# Patient Record
Sex: Female | Born: 1960 | Race: Black or African American | Hispanic: No | Marital: Single | Smoking: Former smoker
Health system: Southern US, Community
[De-identification: ages and names within clinical notes are randomized; demographics above are authoritative.]

## PROBLEM LIST (undated history)

## (undated) ENCOUNTER — Emergency Department (HOSPITAL_COMMUNITY): Payer: Self-pay | Source: Home / Self Care

## (undated) DIAGNOSIS — E785 Hyperlipidemia, unspecified: Secondary | ICD-10-CM

## (undated) DIAGNOSIS — I10 Essential (primary) hypertension: Secondary | ICD-10-CM

## (undated) DIAGNOSIS — D571 Sickle-cell disease without crisis: Secondary | ICD-10-CM

## (undated) DIAGNOSIS — D689 Coagulation defect, unspecified: Secondary | ICD-10-CM

## (undated) DIAGNOSIS — K219 Gastro-esophageal reflux disease without esophagitis: Secondary | ICD-10-CM

## (undated) DIAGNOSIS — I82409 Acute embolism and thrombosis of unspecified deep veins of unspecified lower extremity: Secondary | ICD-10-CM

## (undated) DIAGNOSIS — T7840XA Allergy, unspecified, initial encounter: Secondary | ICD-10-CM

## (undated) DIAGNOSIS — D649 Anemia, unspecified: Secondary | ICD-10-CM

## (undated) HISTORY — DX: Sickle-cell disease without crisis: D57.1

## (undated) HISTORY — DX: Coagulation defect, unspecified: D68.9

## (undated) HISTORY — DX: Anemia, unspecified: D64.9

## (undated) HISTORY — DX: Essential (primary) hypertension: I10

## (undated) HISTORY — DX: Gastro-esophageal reflux disease without esophagitis: K21.9

## (undated) HISTORY — DX: Allergy, unspecified, initial encounter: T78.40XA

## (undated) HISTORY — PX: BREAST SURGERY: SHX581

## (undated) HISTORY — PX: TUBAL LIGATION: SHX77

## (undated) HISTORY — PX: COSMETIC SURGERY: SHX468

---

## 2000-05-28 ENCOUNTER — Other Ambulatory Visit: Admission: RE | Admit: 2000-05-28 | Discharge: 2000-05-28 | Payer: Self-pay | Admitting: Obstetrics and Gynecology

## 2002-05-12 ENCOUNTER — Other Ambulatory Visit: Admission: RE | Admit: 2002-05-12 | Discharge: 2002-05-12 | Payer: Self-pay | Admitting: Obstetrics and Gynecology

## 2002-09-21 ENCOUNTER — Encounter: Payer: Self-pay | Admitting: Family Medicine

## 2002-09-21 ENCOUNTER — Ambulatory Visit (HOSPITAL_COMMUNITY): Admission: RE | Admit: 2002-09-21 | Discharge: 2002-09-21 | Payer: Self-pay | Admitting: Family Medicine

## 2002-11-12 ENCOUNTER — Encounter: Payer: Self-pay | Admitting: Neurology

## 2002-11-12 ENCOUNTER — Ambulatory Visit (HOSPITAL_COMMUNITY): Admission: RE | Admit: 2002-11-12 | Discharge: 2002-11-12 | Payer: Self-pay | Admitting: Neurology

## 2010-05-27 ENCOUNTER — Ambulatory Visit (HOSPITAL_COMMUNITY)
Admission: RE | Admit: 2010-05-27 | Discharge: 2010-05-27 | Payer: Self-pay | Source: Home / Self Care | Attending: Family Medicine | Admitting: Family Medicine

## 2012-04-11 ENCOUNTER — Ambulatory Visit (HOSPITAL_COMMUNITY)
Admission: RE | Admit: 2012-04-11 | Discharge: 2012-04-11 | Payer: Self-pay | Source: Ambulatory Visit | Attending: Family Medicine | Admitting: Family Medicine

## 2012-04-11 ENCOUNTER — Ambulatory Visit (INDEPENDENT_AMBULATORY_CARE_PROVIDER_SITE_OTHER): Payer: Self-pay | Admitting: Family Medicine

## 2012-04-11 VITALS — BP 148/86 | HR 80 | Temp 98.0°F | Resp 16 | Ht 66.0 in | Wt 135.6 lb

## 2012-04-11 DIAGNOSIS — M79609 Pain in unspecified limb: Secondary | ICD-10-CM

## 2012-04-11 DIAGNOSIS — R609 Edema, unspecified: Secondary | ICD-10-CM

## 2012-04-11 DIAGNOSIS — T148XXA Other injury of unspecified body region, initial encounter: Secondary | ICD-10-CM

## 2012-04-11 DIAGNOSIS — I82409 Acute embolism and thrombosis of unspecified deep veins of unspecified lower extremity: Secondary | ICD-10-CM

## 2012-04-11 DIAGNOSIS — R600 Localized edema: Secondary | ICD-10-CM

## 2012-04-11 DIAGNOSIS — R030 Elevated blood-pressure reading, without diagnosis of hypertension: Secondary | ICD-10-CM

## 2012-04-11 MED ORDER — RIVAROXABAN 15 MG PO TABS: 15.0000 mg | ORAL_TABLET | Freq: Two times a day (BID) | ORAL | Status: DC

## 2012-04-11 MED ORDER — MELOXICAM 7.5 MG PO TABS: 7.5000 mg | ORAL_TABLET | Freq: Two times a day (BID) | ORAL | Status: DC | PRN

## 2012-04-11 MED ORDER — WARFARIN SODIUM 5 MG PO TABS: 5.0000 mg | ORAL_TABLET | Freq: Every day | ORAL | Status: DC

## 2012-04-11 MED ORDER — ENOXAPARIN SODIUM 60 MG/0.6ML ~~LOC~~ SOLN: 60.0000 mg | Freq: Two times a day (BID) | SUBCUTANEOUS | Status: DC

## 2012-04-11 NOTE — Progress Notes (Signed)
Subjective:    Patient ID: Marisa Fuller, female    DOB: 1960-07-22, 51 y.o.   MRN: [id number] Chief Complaint  Patient presents with  . Leg Pain    left leg leg swollen and painful after falling off a hay bale and hitting a hand truck last saturday    HPI  Stepped onto of hale bale and slipped and had haul hand track and slipped and hit leg 1 wk ago but didn't really notice anything and then the next days developed lump on leg which she found incidentally and it was very sore.  Then for the past wk, she though it had went down but now it is getting worse again - more painful and larger.  Then she began to get a cold which she tried treating w/ a hot toddy and slept very well after that for 2 nights in a row.  But she noticed that she was having excrutiated pain in her leg overnight that would wake her from sleep when she didn't have a shot of liquor before bed.  And hurting more the more she walks and pressure she puts on it and noticing it a lot when driving.  Pt is currently a Consulting civil engineer at [employer] and she is very worried as she has a Actuary she needs to be at.  She does not have health insurance. Is dating a dentist who told her she better have mass looked at though.  Past Medical History  Diagnosis Date  . Anemia     sickle cell trait  . Sickle cell anemia     trait  . Allergy   . Clotting disorder   . GERD (gastroesophageal reflux disease)    Past Surgical History  Procedure Laterality Date  . Breast surgery    . Cosmetic surgery    . Tubal ligation     No current outpatient prescriptions on file prior to visit.   No current facility-administered medications on file prior to visit.   No Known Allergies     Review of Systems  Constitutional: Positive for activity change. Negative for fever, chills and unexpected weight change.  Respiratory: Negative for cough and shortness of breath.   Cardiovascular: Positive for leg swelling. Negative for  chest pain and palpitations.  Musculoskeletal: Positive for myalgias, arthralgias and gait problem. Negative for back pain and joint swelling.  Skin: Negative for color change and rash.  Neurological: Negative for weakness and numbness.  Psychiatric/Behavioral: Positive for sleep disturbance.      BP 148/86  Pulse 80  Temp(Src) 98 F (36.7 C) (Oral)  Resp 16  Ht 5\' 6"  (1.676 m)  Wt 135 lb 9.6 oz (61.508 kg)  BMI 21.9 kg/m2  SpO2 100% Objective:   Physical Exam  Constitutional: She is oriented to person, place, and time. She appears well-developed and well-nourished. No distress.  HENT:  Head: Normocephalic and atraumatic.  Right Ear: External ear normal.  Left Ear: External ear normal.  Eyes: Conjunctivae are normal. No scleral icterus.  Neck: Normal range of motion. Neck supple. No thyromegaly present.  Cardiovascular: Normal rate, regular rhythm, normal heart sounds and intact distal pulses.   Pulmonary/Chest: Effort normal and breath sounds normal. No respiratory distress.  Musculoskeletal: She exhibits no edema.       Left lower leg: She exhibits tenderness, swelling and edema.  Left proximal shin with tender area of bruising, warmth sev cm linear.  Lymphadenopathy:    She has no cervical adenopathy.  Neurological:  She is alert and oriented to person, place, and time.  Skin: Skin is warm and dry. She is not diaphoretic. No erythema.  Psychiatric: She has a normal mood and affect. Her behavior is normal.      L lower leg 21.6 cm dm ad R lower leg 21 cm dm    Assessment & Plan:  Pt was is sent to the [facility] ER admitting at 3pm for her left lower extremity doppler.  If doppler is negative, she is to continue on an aspirin today w/ prn meloxicam, elevation, heat/ice for now. Call for any increasing pain, swelling, warmth, redness and will call in course of keflex.  Left lower ext DVT - doppler +.  Radiology called with results and we have pt return to our clinic to get  started on education and treatment plan. Difficult as pt does not have health insurance so cannot afford xarelto - we had her try a coupon but it won't work w/o Programmer, applications.  Sent pt to ER to see ER social worker so that she can complete forms showing lack of ability to pay for lovenox and get it through pharmaceutical program and the ER should be able to dispense it to her tonight. Start lovenox 1 mg/kg bid x 5d. Start coumadin 5 mg daily - gave pt extensive info on coumadin - how to take it - and how to regulate her vit K intake. TL called several pharmacies but enoxaparin is prohibitively expensive w/o aide.  Meds ordered this encounter  Medications  . cetirizine (ZYRTEC) 10 MG tablet    Sig: Take 10 mg by mouth daily.  [name] DISCONTD: aspirin 325 MG tablet    Sig: Take 325 mg by mouth daily.  [name] DISCONTD: meloxicam (MOBIC) 7.5 MG tablet    Sig: Take 1 tablet (7.5 mg total) by mouth 2 (two) times daily as needed for pain.    Dispense:  30 tablet    Refill:  1                                                                  . warfarin (COUMADIN) 5 MG tablet    Sig: Take 1 tablet (5 mg total) by mouth daily.    Dispense:  30 tablet    Refill:  0  . enoxaparin (LOVENOX) 60 MG/0.6ML injection    Sig: Inject 0.6 mLs (60 mg total) into the skin every 12 (twelve) hours.    Dispense:  6 mL    Refill:  0

## 2012-04-11 NOTE — Patient Instructions (Signed)
Read below - if you are seeing any of the below symptoms worsening, call IMMEDIATELY so we can call you in an antibiotic.  Continue taking aspirin 325mg  1 tab po qd and take the meloxicam as needed up to twice a day for inflammation and pain.  Keep the leg elevated and use ice and heat whenever possible to help with the inflammation.  Cellulitis Cellulitis is an infection of the skin and the tissue beneath it. The infected area is usually red and tender. Cellulitis occurs most often in the arms and lower legs.  CAUSES  Cellulitis is caused by bacteria that enter the skin through cracks or cuts in the skin. The most common types of bacteria that cause cellulitis are Staphylococcus and Streptococcus. SYMPTOMS   Redness and warmth.  Swelling.  Tenderness or pain.  Fever. DIAGNOSIS  Your caregiver can usually determine what is wrong based on a physical exam. Blood tests may also be done. TREATMENT  Treatment usually involves taking an antibiotic medicine. HOME CARE INSTRUCTIONS   Take your antibiotics as directed. Finish them even if you start to feel better.  Keep the infected arm or leg elevated to reduce swelling.  Apply a warm cloth to the affected area up to 4 times per day to relieve pain.  Only take over-the-counter or prescription medicines for pain, discomfort, or fever as directed by your caregiver.  Keep all follow-up appointments as directed by your caregiver. SEEK MEDICAL CARE IF:   You notice red streaks coming from the infected area.  Your red area gets larger or turns dark in color.  Your bone or joint underneath the infected area becomes painful after the skin has healed.  Your infection returns in the same area or another area.  You notice a swollen bump in the infected area.  You develop new symptoms. SEEK IMMEDIATE MEDICAL CARE IF:   You have a fever.  You feel very sleepy.  You develop vomiting or diarrhea.  You have a general ill feeling (malaise)  with muscle aches and pains. MAKE SURE YOU:   Understand these instructions.  Will watch your condition.  Will get help right away if you are not doing well or get worse. Document Released: 03/13/2005 Document Revised: 12/03/2011 Document Reviewed: 08/19/2011 [facility] Patient Information 2013 [place], [place].

## 2012-04-12 NOTE — Progress Notes (Signed)
VASCULAR LAB PRELIMINARY  PRELIMINARY  PRELIMINARY  PRELIMINARY  Left lower extremity venous Doppler completed.    Preliminary report:  There is acute DVT noted in the peroneal vein of the left lower extremity, from just above ankle, extending approximately 1-2 inches into calf.  Lillar Bianca, 04/12/2012, 2:39 PM

## 2012-04-15 ENCOUNTER — Telehealth: Payer: Self-pay | Admitting: Radiology

## 2012-04-15 ENCOUNTER — Ambulatory Visit: Payer: Self-pay | Admitting: Family Medicine

## 2012-04-15 VITALS — BP 155/73 | HR 88 | Temp 98.3°F | Resp 16 | Ht 66.75 in | Wt 137.0 lb

## 2012-04-15 DIAGNOSIS — Z86718 Personal history of other venous thrombosis and embolism: Secondary | ICD-10-CM | POA: Insufficient documentation

## 2012-04-15 DIAGNOSIS — I82409 Acute embolism and thrombosis of unspecified deep veins of unspecified lower extremity: Secondary | ICD-10-CM

## 2012-04-15 MED ORDER — ENOXAPARIN SODIUM 60 MG/0.6ML ~~LOC~~ SOLN: 60.0000 mg | Freq: Two times a day (BID) | SUBCUTANEOUS | Status: DC

## 2012-04-15 NOTE — Progress Notes (Signed)
  Subjective:    Patient ID: Marisa Fuller, female    DOB: 08-01-1960, 51 y.o.   MRN: [id number]  Hypertension Pertinent negatives include no chest pain, palpitations or shortness of breath.    Did not start the coumadin.  She read over the instructions and talked to different people and it made her really concerned about the side effects.  She is scared of getting a permanent bruise or clots or stroke.  SHe has been taking the bid lovenox w/o trouble though she can feel her skin thickened over the injection space.  L leg feeling a little more painful near the ankle but less tender than a few days ago but skin feels tight.  No past medical history on file.   Review of Systems  Constitutional: Negative for fever, chills, diaphoresis and fatigue.  HENT: Positive for congestion.   Respiratory: Negative for cough and shortness of breath.   Cardiovascular: Positive for leg swelling. Negative for chest pain and palpitations.  Musculoskeletal: Negative for back pain, joint swelling and arthralgias.  Skin: Negative for color change and wound.  Hematological: Does not bruise/bleed easily.     BP 155/73  Pulse 88  Temp 98.3 F (36.8 C)  Resp 16  Ht 5' 6.75" (1.695 m)  Wt 137 lb (62.143 kg)  BMI 21.62 kg/m2    Objective:   Physical Exam  Constitutional: She is oriented to person, place, and time. She appears well-developed and well-nourished. No distress.  HENT:  Head: Normocephalic and atraumatic.  Cardiovascular: Normal rate, regular rhythm, normal heart sounds and intact distal pulses.   Pulmonary/Chest: Effort normal and breath sounds normal. No respiratory distress.  Musculoskeletal:       Left lower extremity w/ 2+ pitting edema to mid shin.  Neurological: She is alert and oriented to person, place, and time.  Skin: Skin is warm and dry. No rash noted. She is not diaphoretic.  Psychiatric: She has a normal mood and affect. Her behavior is normal.          Assessment & Plan:   1. Left lower ext acute DVT - start coumadin. Cont lovenox for 3 additional days so that she has 5d of coverage after staring coumadin.

## 2012-04-15 NOTE — Patient Instructions (Addendum)
Deep Vein Thrombosis  A deep vein thrombosis (DVT) is a blood clot that develops in a deep vein. A DVT is a clot in the deep, larger veins of the leg, arm, or pelvis. These are more dangerous than clots that might form in veins near the surface of the body. A DVT can lead to complications if the clot breaks off and travels in the bloodstream to the lungs.   A DVT can damage the valves in your leg veins, so that instead of flowing upwards, the blood pools in the lower leg. This is called post-thrombotic syndrome, and can result in pain, swelling, discoloration, and sores on the leg.  Once identified, a DVT can be treated. It can also be prevented in some circumstances. Once you have had a DVT, you may be at increased risk for a DVT in the future.  CAUSES  Blood clots form in a vein for different reasons. Usually several things contribute to blood clots. Contributing factors include:   The flow of blood slows down.   The inside of the vein is damaged in some way.   The person has a condition that makes blood clot more easily.  Some people are more likely than others to develop blood clots. That is because they have more factors that make clots likely. These are called risk factors. Risk factors include:    Older age, especially over 75 years old.   Having a history of blood clots. This means you have had one before. Or, it means that someone else in your family has had blood clots. You may have a genetic tendency to form clots.   Having major or lengthy surgery. This is especially true for surgery on the hip, knee, or belly (abdomen). Hip surgery is particularly high risk.   Breaking a hip or leg.   Sitting or lying still for a long time. This includes long distance travel, paralysis, or recovery from an illness or surgery.   Cancer, or cancer treatment.   Having a long, thin tube (catheter) placed inside a vein during a medical procedure.   Being overweight (obese).   Pregnancy and childbirth. Hormone  changes make the blood clot more easily during pregnancy. The fetus puts pressure on the veins of the pelvis. There is also risk of injury to veins during delivery or a caesarean. The risk is at its highest just after childbirth.   Medicines with the female hormone estrogen. This includes birth control pills and hormone replacement therapy.   Smoking.   Other circulation or heart problems.  SYMPTOMS  When a clot forms, it can either partially or totally block the blood flow in that vein. Symptoms of a DVT can include:   Swelling of the leg or arm, especially if one side is much worse.   Warmth and redness of the leg or arm, especially if one side is much worse.   Pain in an arm or leg. If the clot is in the leg, symptoms may be more noticeable or worse when standing or walking.  The symptoms of a DVT that has traveled to the lungs (pulmonary embolism, PE) usually start suddenly, and include:   Shortness of breath.   Coughing.   Coughing up blood or blood-tinged phlegm.   Chest pain. The chest pain is often worse with deep breaths.   Rapid heartbeat.  Anyone with these symptoms should get emergency medical treatment right away. Call your local emergency services 911 in [place] if you have these symptoms.    the clotting properties of the blood.  Ultrasonography to see if you have clots in your legs or lungs.  X-rays to show the flow of blood when dye is injected into the veins (venography).  Studies of your lungs, if you have any chest symptoms. PREVENTION  Exercise the legs regularly. Take a brisk 30 minute walk every day.  Maintain a weight that is appropriate for your height.  Avoid sitting or lying in bed for long periods of time without moving your legs.  Women, particularly those over the age of 35,  should consider the risks and benefits of taking estrogen medicines, including birth control pills.  Do not smoke, especially if you take estrogen medicines.  Long distance travel can increase your risk of DVT. You should exercise your legs by walking or pumping the muscles every hour.  In-hospital prevention:  Many of the risk factors above relate to situations that exist with hospitalization, either for illness, injury, or elective surgery.  Your caregiver will assess you for the need for venous thromboembolism prophylaxis when you are admitted to the hospital. If you are having surgery, your surgeon will assess you the day of or day after surgery.  Prevention may include medical and nonmedical measures. TREATMENT Treatment for DVT helps prevent death and disability. The most common treatment for DVT is blood thinning (anticoagulant) medicine, which reduces the blood's tendency to clot. Anticoagulants can stop new blood clots from forming and old ones from growing. They cannot dissolve existing clots. Your body does this by itself over time. Anticoagulants can be given by mouth, by intravenous (IV) access, or by injection. Your caregiver will determine the best program for you.  Heparin or related medicines (low molecular weight heparin) are usually the first treatment for a blood clot. They act quickly. However, they cannot be taken orally.  Heparin can cause a fall in a component of blood that stops bleeding and forms blood clots (platelets). You will be monitored with blood tests to be sure this does not occur.  Warfarin is an anticoagulant that can be swallowed (taken orally). It takes a few days to start working, so usually heparin or related medicines are used in combination. Once warfarin is working, heparin is usually stopped.  Less commonly, clot dissolving drugs (thrombolytics) are used to dissolve a DVT. They carry a high risk of bleeding, so they are used mainly in severe cases,  where a life or limb is threatened.  Very rarely, a blood clot in the leg needs to be removed surgically.  If you are unable to take anticoagulants, your caregiver may arrange for you to have a filter placed in a main vein in your belly (abdomen). This filter prevents clots from traveling to your lungs. HOME CARE INSTRUCTIONS  Take all medicines prescribed by your caregiver. Follow the directions carefully.  Warfarin. Most people will continue taking warfarin after hospital discharge. Your caregiver will advise you on the length of treatment (usually 3 6 months, sometimes lifelong).  Too much and too little warfarin are both dangerous. Too much warfarin increases the risk of bleeding. Too little warfarin continues to allow the risk for blood clots. While taking warfarin, you will need to have regular blood tests to measure your blood clotting time. These blood tests usually include both the prothrombin time (PT) and international normalized ratio (INR) tests. The PT and INR results allow your caregiver to adjust your dose of warfarin. The dose can change for many reasons. It is critically important that   blood clots. While taking warfarin, you will need to have regular blood tests to measure your blood clotting time. These blood tests usually include both the prothrombin time (PT) and international normalized ratio (INR) tests. The PT and INR results allow your caregiver to adjust your dose of warfarin. The dose can change for many reasons. It is critically important that you take warfarin exactly as prescribed, and that you have your PT and INR levels drawn exactly as directed.   Many foods, especially foods high in vitamin K can interfere with warfarin and affect the PT and INR results. Foods high in vitamin K include spinach, kale, broccoli, cabbage, collard and turnip greens, brussels sprouts, peas, cauliflower, seaweed, and parsley as well as beef and pork liver, green tea, and soybean oil. You should eat a consistent amount of foods high in vitamin K. Avoid major changes in your diet, or notify your caregiver before changing your diet. Arrange a visit with a dietitian to answer your questions.   Many medicines can interfere with warfarin and affect the PT and INR results. You must tell your caregiver about any and all medicines you take, this includes all vitamins  and supplements. Be especially cautious with aspirin and anti-inflammatory medicines. Ask your caregiver before taking these. Do not take or discontinue any prescribed or over-the-counter medicine except on the advice of your caregiver or pharmacist.   Warfarin can have side effects, primarily excessive bruising or bleeding. You will need to hold pressure over cuts for longer than usual. Your caregiver or pharmacist will discuss other potential side effects.   Alcohol can change the body's ability to handle warfarin. It is best to avoid alcoholic drinks or consume only very small amounts while taking warfarin. Notify your caregiver if you change your alcohol intake.   Notify your dentist or other caregivers before procedures.   Activity. Ask your caregiver how soon you can go back to normal activities. It is important to stay active to prevent blood clots. If you are on anticoagulant medicine, avoid contact sports.   Exercise. It is very important to exercise. This is especially important while traveling, sitting or standing for long periods of time. Exercise your legs by walking or by pumping the muscles frequently. Take frequent walks.   Compression stockings. These are tight elastic stockings that apply pressure to the lower legs. This pressure can help keep the blood in the legs from clotting. You may need to wear compressions stockings at home to help prevent a DVT.   Smoking. If you smoke, quit. Ask your caregiver for help with quitting smoking.   Learn as much as you can about DVT. Knowing more about the condition should help you keep it from coming back.   Wear a medical alert bracelet or carry a medical alert card.  SEEK MEDICAL CARE IF:   You notice a rapid heartbeat.   You feel weaker or more tired than usual.   You feel faint.   You notice increased bruising.   You feel your symptoms are not getting better in the time expected.   You believe you are having side effects of medicine.  SEEK  IMMEDIATE MEDICAL CARE IF:   You have chest pain.   You have trouble breathing.   You have new or increased swelling or pain in one leg.   You cough up blood.   You notice blood in vomit, in a bowel movement, or in urine.  MAKE SURE YOU:   Understand these instructions.  

## 2012-04-17 ENCOUNTER — Telehealth: Payer: Self-pay

## 2012-04-17 NOTE — Telephone Encounter (Signed)
Yes, also, make sure she is changing the injection site every time - other side of her stomach, etc. (not sure she is doing this.)  Agree w/ pressure x 5 min - no peeking. If continues or any other bleeding, let us know right away

## 2012-04-17 NOTE — Telephone Encounter (Signed)
Thanks, I have called her and the site is being alternated. She will call if anything else is needed.

## 2012-04-17 NOTE — Telephone Encounter (Signed)
Patient is bleeding from the injection site, from the Lovenox. She states it bled through to her shirt, is the size of a quarter. I advised her to put gauze and hold pressure on injection site for five minutes, then place bandaid. FYI, if anything else is needed with her, please let me know. Amy

## 2012-04-17 NOTE — Telephone Encounter (Signed)
PT STATES SHE WOULD LIKE A CALL BACK FROM DR Clelia Croft RE STARTING TO BLEED SINCE TAKING INJECTIONS PRESCRIBED.  BEST PHONE [phone]

## 2012-04-20 ENCOUNTER — Ambulatory Visit (INDEPENDENT_AMBULATORY_CARE_PROVIDER_SITE_OTHER): Payer: Self-pay | Admitting: Family Medicine

## 2012-04-20 ENCOUNTER — Encounter: Payer: Self-pay | Admitting: Family Medicine

## 2012-04-20 VITALS — BP 130/64 | HR 80 | Temp 97.5°F | Resp 16 | Ht 67.18 in | Wt 136.8 lb

## 2012-04-20 DIAGNOSIS — I82409 Acute embolism and thrombosis of unspecified deep veins of unspecified lower extremity: Secondary | ICD-10-CM

## 2012-04-20 DIAGNOSIS — Z7902 Long term (current) use of antithrombotics/antiplatelets: Secondary | ICD-10-CM | POA: Insufficient documentation

## 2012-04-20 NOTE — Telephone Encounter (Signed)
Spoke to patient about her lovenox injections, see previous.

## 2012-04-21 LAB — PROTIME-INR
INR: 1.46 (ref <1.50)
Prothrombin Time: 17.5 seconds — ABNORMAL HIGH (ref 11.6–15.2)

## 2012-04-21 NOTE — Progress Notes (Signed)
  Subjective:    Patient ID: [name], female    DOB: 25-Mar-1961, 51 y.o.   MRN: [id number]  HPI Started coumadin. Today is her 5th day of coumadin and last day of lovenox - she gave herself her last shot this a.m.  Has not had any further bleeding from injection site but does have some lumps under her skin on her left abd from injection.  No other further bleeding  No past medical history on file.   Review of Systems  Constitutional: Negative for fatigue.  HENT: Negative for nosebleeds.   Gastrointestinal: Negative for blood in stool and anal bleeding.  Genitourinary: Negative for dysuria and hematuria.  Skin: Negative for color change, pallor, rash and wound.  Neurological: Negative for syncope and light-headedness.  Hematological: Negative for adenopathy. Bruises/bleeds easily.      BP 130/64  Pulse 80  Temp 97.5 F (36.4 C) (Oral)  Resp 16  Ht 5' 7.18" (1.706 m)  Wt 136 lb 12.8 oz (62.052 kg)  BMI 21.31 kg/m2  SpO2 100%  Objective:   Physical Exam  Constitutional: She is oriented to person, place, and time. She appears well-developed and well-nourished. No distress.  HENT:  Head: Normocephalic and atraumatic.  Pulmonary/Chest: Effort normal.  Abdominal: Soft. There is no tenderness.  Musculoskeletal:       2+ pitting edema in LLext to mid calf  Neurological: She is alert and oriented to person, place, and time.  Skin: Skin is warm and dry. Bruising noted. No lesion and no rash noted. She is not diaphoretic. No erythema. No pallor.       Sev 1/2 to 1 cm dm indurated area in subcu tissues along RLQ abd - no tenderness, warmth, erythema or fluctuance.  Psychiatric: She has a normal mood and affect. Her behavior is normal. Judgment and thought content normal.          Assessment & Plan:   1. Encounter for long-term (current) use of antiplatelets/antithrombotics(V58.63)  Protime-INR  2. Deep venous thrombosis  Goal INR 2-3  Check INR and f/u as instructed. On  coumadin 5mg  daily x 5d only. Discussed need to continue coumadin x 3-53mos. Will re-eval at 3 mos to discuss length of therapy as side effects and cost of testing will play a factor (pt does not have health insurance so coumadin monitoring will be expensive and will try to minimize as is safe.)  Addendum: Results for orders placed in visit on 04/20/12  PROTIME-INR      Component Value Range   Prothrombin Time 17.5 (*) 11.6 - 15.2 seconds   INR 1.46  <1.50   Increase coumadin to 7.5mg  qMWF and 5mg  all other days (TTSS). Recheck in 1 wk - Tues 11/12.

## 2012-04-24 ENCOUNTER — Telehealth: Payer: Self-pay

## 2012-04-24 ENCOUNTER — Encounter: Payer: Self-pay | Admitting: Family Medicine

## 2012-04-24 ENCOUNTER — Ambulatory Visit: Payer: Self-pay | Admitting: Family Medicine

## 2012-04-24 DIAGNOSIS — R5383 Other fatigue: Secondary | ICD-10-CM

## 2012-04-24 DIAGNOSIS — R002 Palpitations: Secondary | ICD-10-CM

## 2012-04-24 DIAGNOSIS — I82409 Acute embolism and thrombosis of unspecified deep veins of unspecified lower extremity: Secondary | ICD-10-CM

## 2012-04-24 LAB — POCT CBC
Granulocyte percent: 57.4 %G (ref 37–80)
HCT, POC: 37.9 % (ref 37.7–47.9)
Hemoglobin: 11.9 g/dL — AB (ref 12.2–16.2)
Lymph, poc: 1.6 (ref 0.6–3.4)
MCV: 96.7 fL (ref 80–97)
MPV: 8.8 fL (ref 0–99.8)
POC LYMPH PERCENT: 36.1 %L (ref 10–50)
POC MID %: 6.5 %M (ref 0–12)
RBC: 3.92 M/uL — AB (ref 4.04–5.48)
RDW, POC: 14.6 %

## 2012-04-24 LAB — PROTIME-INR: INR: 1.97 — ABNORMAL HIGH (ref <1.50)

## 2012-04-24 NOTE — Telephone Encounter (Signed)
Patient states she has been seeing Dr. Clelia Croft and is having extreme pain in leg. Thinks that there was a blood clot. Is not sure if she should come back in (has been seen for this per pt), or what she should do. [phone]

## 2012-04-24 NOTE — Telephone Encounter (Signed)
Called pt back and and she reported that her leg pain never really went away but it has worsened and she is afraid she might have another blood clot. Advised pt to RTC and she asked if there is any way that Dr Clelia Croft can see her since she is the one who evaluated her before and can better compare. I called over to 104 to see if Dr Clelia Croft has any openings that she could see her there. While speaking w/them pt got cut off or had to hang up and I gave them pt's number because Dr Clelia Croft wanted to speak w/her.

## 2012-04-24 NOTE — Telephone Encounter (Signed)
Checked back w/Renee at 104 and pt was able to speak w/Dr Clelia Croft and is coming in to see her this afternoon.

## 2012-04-24 NOTE — Progress Notes (Signed)
  Subjective:    Patient ID: Marisa Fuller, female    DOB: [birth year], 51 y.o.   MRN: [id number]  HPI  Shona Needles, pt started having much worse pain in her left lower leg and noticed that the swelling was getting worse, it is more warm and tender.  Pt has acute left leg dvt for which she is on coumadin but is not yet therapeutic. She finished her lovenox about 4d ago and her INR was 1.4 so her coumadin dose was increased. She takes her coumadin every morning but did not take it this a.m. as she was in a rush.  She used her mobic once with sig relief.  She has been feeling much more fatigued, has developed an occ dry cough, and has had sev episodes of palpitations/heart fluttering in the past few days.  She is post-menopausal and no changes in bowels or bladder. She does have sickle cell trait and so is freq anemic.    No past medical history on file.   Review of Systems  Constitutional: Negative for fatigue.  HENT: Negative for nosebleeds.   Respiratory: Positive for cough. Negative for chest tightness and shortness of breath.   Cardiovascular: Positive for palpitations and leg swelling. Negative for chest pain.  Gastrointestinal: Negative for blood in stool and anal bleeding.  Genitourinary: Negative for dysuria and hematuria.  Musculoskeletal: Positive for myalgias, arthralgias and gait problem. Negative for joint swelling.  Skin: Negative for color change, pallor, rash and wound.  Neurological: Negative for syncope and light-headedness.  Hematological: Negative for adenopathy. Bruises/bleeds easily.      There were no vitals taken for this visit.  Objective:   Physical Exam  Constitutional: She is oriented to person, place, and time. She appears well-developed and well-nourished. No distress.  HENT:  Head: Normocephalic and atraumatic.  Right Ear: External ear normal.  Left Ear: External ear normal.  Eyes: Conjunctivae normal are normal. No scleral icterus.  Neck: Normal range of  motion. Neck supple. No thyromegaly present.  Cardiovascular: Normal rate, regular rhythm, normal heart sounds and intact distal pulses.   Pulmonary/Chest: Effort normal and breath sounds normal. No respiratory distress.  Musculoskeletal: She exhibits edema and tenderness.       Left lower leg: She exhibits tenderness, swelling, edema and deformity.       Legs:      Area of swelling, tenderness, warmth marked and increased from prev, no erythema seen.  Lymphadenopathy:    She has no cervical adenopathy.  Neurological: She is alert and oriented to person, place, and time.  Skin: Skin is warm and dry. She is not diaphoretic. No erythema.  Psychiatric: She has a normal mood and affect. Her behavior is normal.          Assessment & Plan:   1. Heart palpitations  EKG 12-Lead  2. Fatigue  POCT CBC  3. DVT (deep venous thrombosis)  Protime-INR  Unsure if these has been an extension of the dvt or if this could represent some underlying cellulitis or infection of the clot.  Will check cbc and inr stat before making decisions. Ok to take mobic. Pt instructed to call tomorrow w/ update on sxs.

## 2012-04-28 ENCOUNTER — Ambulatory Visit (INDEPENDENT_AMBULATORY_CARE_PROVIDER_SITE_OTHER): Payer: Self-pay | Admitting: Family Medicine

## 2012-04-28 VITALS — BP 138/82 | HR 82 | Temp 98.1°F | Resp 20 | Ht 67.0 in | Wt 135.8 lb

## 2012-04-28 DIAGNOSIS — Z7902 Long term (current) use of antithrombotics/antiplatelets: Secondary | ICD-10-CM

## 2012-04-28 NOTE — Progress Notes (Signed)
  Subjective:    Patient ID: Marisa Fuller, female    DOB: 1961-01-05, 51 y.o.   MRN: [id number]  HPI  Pt is doing well.  Has had a few shooting pains in leg but pain overall resolved and swelling decreased.  The bruise is fading though the enlarged tender spot on her shin is still warm but seems to be getting better. No prob w/ coumadin, on 5 mg daily except for 7.5mg  a MWF. Past Medical History  Diagnosis Date  . Anemia     sickle cell trait  . Sickle cell anemia     trait   Current Outpatient Prescriptions on File Prior to Visit  Medication Sig Dispense Refill  . cetirizine (ZYRTEC) 10 MG tablet Take 10 mg by mouth daily.      . meloxicam (MOBIC) 7.5 MG tablet Take 1 tablet (7.5 mg total) by mouth 2 (two) times daily as needed for pain.  30 tablet  1  . warfarin (COUMADIN) 5 MG tablet Take 1 tablet (5 mg total) by mouth daily.  30 tablet  0  . enoxaparin (LOVENOX) 60 MG/0.6ML injection Inject 0.6 mLs (60 mg total) into the skin every 12 (twelve) hours.  3 mL  0     Review of Systems  Constitutional: Positive for fatigue.  HENT: Negative for nosebleeds.   Gastrointestinal: Negative for blood in stool and anal bleeding.  Genitourinary: Negative for dysuria and hematuria.  Skin: Negative for color change, pallor, rash and wound.  Neurological: Negative for syncope and light-headedness.  Hematological: Negative for adenopathy. Bruises/bleeds easily.        BP 138/82  Pulse 82  Temp 98.1 F (36.7 C) (Oral)  Resp 20  Ht 5\' 7"  (1.702 m)  Wt 135 lb 12.8 oz (61.598 kg)  BMI 21.27 kg/m2  SpO2 100% Objective:   Physical Exam  Constitutional: She is oriented to person, place, and time. She appears well-developed and well-nourished. No distress.  HENT:  Head: Normocephalic and atraumatic.  Right Ear: External ear normal.  Eyes: Conjunctivae normal are normal. No scleral icterus.  Cardiovascular: Normal rate, regular rhythm and normal heart sounds.   Pulmonary/Chest: Effort normal  and breath sounds normal. No respiratory distress.  Musculoskeletal:       Left lower leg: She exhibits tenderness, swelling and edema. She exhibits no bony tenderness and no laceration.  Neurological: She is alert and oriented to person, place, and time.  Skin: Skin is warm and dry. She is not diaphoretic. No erythema.  Psychiatric: She has a normal mood and affect. Her behavior is normal.          Assessment & Plan:   1. Encounter for long-term (current) use of antiplatelets/antithrombotics(V58.63)  Protime-INR  The warm swollen knot on pt's left shin is MUCH improved and edema stable.  Her INR last Fri was close to goal at 1.97 when she was on her  current dose for just 3d.  Will recheck INR this Thurs (which she will have been on her current dose for 9 days) - future order entered. She does not have to see a provider at that visit - I will let her know dosing instructions on Friday. Hopefully, we can minimize future INRs as long as pt is stable due to cost.

## 2012-04-30 ENCOUNTER — Other Ambulatory Visit (INDEPENDENT_AMBULATORY_CARE_PROVIDER_SITE_OTHER): Payer: Self-pay | Admitting: *Deleted

## 2012-04-30 VITALS — BP 128/75 | HR 88

## 2012-04-30 DIAGNOSIS — Z7902 Long term (current) use of antithrombotics/antiplatelets: Secondary | ICD-10-CM

## 2012-04-30 LAB — PROTIME-INR: Prothrombin Time: 26.8 seconds — ABNORMAL HIGH (ref 11.6–15.2)

## 2012-05-01 ENCOUNTER — Other Ambulatory Visit: Payer: Self-pay | Admitting: Family Medicine

## 2012-05-01 DIAGNOSIS — Z7902 Long term (current) use of antithrombotics/antiplatelets: Secondary | ICD-10-CM

## 2012-05-02 ENCOUNTER — Telehealth: Payer: Self-pay

## 2012-05-02 NOTE — Telephone Encounter (Signed)
Called pt, she states she doesn't have the money to come in. She states the bleeding has stopped. I advised her to come in if it starts again or gets worse. If we are closed go to the nearest emergency room. Pt agreed

## 2012-05-02 NOTE — Telephone Encounter (Signed)
Pt states that she is having mouth bleeding, and would like to discuss with Dr.Shaw if she should be concerned. Best#  [phone]

## 2012-05-02 NOTE — Telephone Encounter (Signed)
Needs OV.  

## 2012-05-02 NOTE — Telephone Encounter (Signed)
I would advise to RTC. Is this ok to tell patient?

## 2012-05-04 ENCOUNTER — Telehealth: Payer: Self-pay

## 2012-05-04 MED ORDER — IPRATROPIUM BROMIDE 0.06 % NA SOLN: NASAL | Status: DC

## 2012-05-04 NOTE — Telephone Encounter (Signed)
Called pt. She is having persistent oozing of her gums - worst at night after she brushes her teeth but persists all day. Can taste it but is not spitting up blood, not a large amount. Has had 2 nosebleeds - each just last for a few seconds and resolve with pinching her nose.  She is using Afrin nasal spray daily and chronically - she states w/o this she can't breath. Using zyrtec qd as well. Told pt to switch to soft toothbrush, brush lightly, and use mouthwash freq. She has to stop using Afrin - will get worsening rebound congestion initially but should resolve after sev days. Use freq saline nasal spray to keep nares moist and prevent irritation and bleeds.  Stop Afrin and please call in ipratropium bromide nasal spray 0.06%, 2 sprays each nostril qid prn congestion. Disp: 1, Ref: 11.  Pt will plan to come in tomorrow to have her INR drawn (I placed the order at the last OV). She does not need to see a provider and results to be sent to me.

## 2012-05-04 NOTE — Telephone Encounter (Signed)
Rx sent to [facility] outpt phar for ipratropium ns.

## 2012-05-04 NOTE — Telephone Encounter (Signed)
LVM that I was trying to reach her and if I couldn't will have our nursing staff talk to her.

## 2012-05-04 NOTE — Telephone Encounter (Signed)
Pt states that she was told that her message from the weekend was going to be given to Dr Clelia Croft.  Patients states that she would like to speak with only Dr Clelia Croft.  I did see where Delaney Meigs spoke with the patient and told her that she would have to RTC or go to the emergency room is her symptoms got worse.    Best#: [phone]

## 2012-05-04 NOTE — Telephone Encounter (Signed)
Patient called this week end for bleeding from her mouth ?nose, and was advised to come here or go to ER< now would like Dr Clelia Croft to call her back, will not elaborate on nature of call, only states to have Dr Clelia Croft call her. Please advise. Dakarri Kessinger

## 2012-05-05 ENCOUNTER — Other Ambulatory Visit (INDEPENDENT_AMBULATORY_CARE_PROVIDER_SITE_OTHER): Payer: Self-pay | Admitting: Family Medicine

## 2012-05-05 DIAGNOSIS — Z7902 Long term (current) use of antithrombotics/antiplatelets: Secondary | ICD-10-CM

## 2012-05-05 NOTE — Progress Notes (Signed)
Patient here for labs only. 

## 2012-05-06 ENCOUNTER — Telehealth: Payer: Self-pay

## 2012-05-06 LAB — PROTIME-INR: INR: 2.73 — ABNORMAL HIGH (ref <1.50)

## 2012-05-06 NOTE — Telephone Encounter (Signed)
PT STATES SHE HAD LAB WORK DONE OVER A WEEK AGO AND STILL HASN'T HEARD FROM ANYONE. PLEASE CALL [phone]

## 2012-05-06 NOTE — Telephone Encounter (Signed)
Dr. Leander Rams  Advised pt of lab work per Valarie Cones.  That labs at goal can continue current dose.  Pt states that she is having shooting pain down her left leg. Per Weber if it different from her last visit then pt needs to be seen.  Pt stated if pain does not subside that she will be in.  Gave her Dr. Clelia Croft schedule.

## 2012-05-07 NOTE — Telephone Encounter (Signed)
Agree 

## 2012-05-12 ENCOUNTER — Other Ambulatory Visit: Payer: Self-pay | Admitting: *Deleted

## 2012-05-12 MED ORDER — WARFARIN SODIUM 5 MG PO TABS: ORAL_TABLET | ORAL | Status: DC

## 2012-05-14 NOTE — Progress Notes (Signed)
Spoke with patient and gave her Dr. Alver Fisher message.  She states she has not seen any blood in stool last couple of days.  Will return to clinic on 05/15/2012 but would like a fast pass.

## 2012-06-20 ENCOUNTER — Ambulatory Visit: Payer: Self-pay | Admitting: Family Medicine

## 2012-06-20 VITALS — BP 130/82 | HR 96 | Temp 98.8°F | Resp 18 | Wt 137.0 lb

## 2012-06-20 DIAGNOSIS — K921 Melena: Secondary | ICD-10-CM

## 2012-06-20 DIAGNOSIS — R05 Cough: Secondary | ICD-10-CM

## 2012-06-20 DIAGNOSIS — R51 Headache: Secondary | ICD-10-CM

## 2012-06-20 DIAGNOSIS — R059 Cough, unspecified: Secondary | ICD-10-CM

## 2012-06-20 DIAGNOSIS — R6889 Other general symptoms and signs: Secondary | ICD-10-CM

## 2012-06-20 DIAGNOSIS — Z7901 Long term (current) use of anticoagulants: Secondary | ICD-10-CM

## 2012-06-20 DIAGNOSIS — Z5181 Encounter for therapeutic drug level monitoring: Secondary | ICD-10-CM

## 2012-06-20 DIAGNOSIS — R110 Nausea: Secondary | ICD-10-CM

## 2012-06-20 DIAGNOSIS — I82409 Acute embolism and thrombosis of unspecified deep veins of unspecified lower extremity: Secondary | ICD-10-CM

## 2012-06-20 LAB — POCT CBC
Granulocyte percent: 78.7 % (ref 37–80)
HCT, POC: 39.4 % (ref 37.7–47.9)
Hemoglobin: 12.5 g/dL (ref 12.2–16.2)
Lymph, poc: 1.2 (ref 0.6–3.4)
MCH, POC: 30.6 pg (ref 27–31.2)
MCHC: 31.7 g/dL — AB (ref 31.8–35.4)
MCV: 96.4 fL (ref 80–97)
MID (cbc): 0.5 (ref 0–0.9)
MPV: 8.6 fL (ref 0–99.8)
POC Granulocyte: 6.3 (ref 2–6.9)
POC LYMPH PERCENT: 14.9 %L (ref 10–50)
POC MID %: 6.4 %M (ref 0–12)
Platelet Count, POC: 371 10*3/uL (ref 142–424)
RBC: 4.09 M/uL (ref 4.04–5.48)
RDW, POC: 14.6 %
WBC: 8 10*3/uL (ref 4.6–10.2)

## 2012-06-20 LAB — IFOBT (OCCULT BLOOD): IFOBT: NEGATIVE

## 2012-06-20 MED ORDER — HYDROCODONE-HOMATROPINE 5-1.5 MG/5ML PO SYRP: 5.0000 mL | ORAL_SOLUTION | Freq: Three times a day (TID) | ORAL | Status: DC | PRN

## 2012-06-20 MED ORDER — OSELTAMIVIR PHOSPHATE 75 MG PO CAPS: 75.0000 mg | ORAL_CAPSULE | Freq: Two times a day (BID) | ORAL | Status: DC

## 2012-06-20 MED ORDER — BENZONATATE 100 MG PO CAPS: 200.0000 mg | ORAL_CAPSULE | Freq: Two times a day (BID) | ORAL | Status: DC | PRN

## 2012-06-20 NOTE — Progress Notes (Addendum)
[facility]:  Office Visit  Chief Complaint:  Chief Complaint  Patient presents with  . Headache  . Shortness of Breath  . Nausea  . Leg Pain    right leg-h/o blood clot in left leg    HPI: Marisa Fuller is a 52 y.o. female who complains of 2-3 day history fevers,  Chills, cough, nasal  drainage, sinus pressure,generalized aches.  Lots of Coughing and having HA. Took ibuprofen for HA, hot totties ( honey, whiskey, lemon, tea) x 1 day which allowed her sleep. Generalized body pain. H/o of coumadin use for left DVT. Has had some bleeding in past and told Dr. Clelia Croft and was told to come in to get her INR recheck and also rectal exam but was scared so did not do it.   She has a history of blood clot, left leg of coumadin. Dx in 03/2012. Today she has right side leg pain in mid calf. Denies SOB but not sure since has been coughing.   Past Medical History  Diagnosis Date  . Anemia     sickle cell trait  . Sickle cell anemia     trait  . Allergy   . Clotting disorder    Past Surgical History  Procedure Date  . Breast surgery   . Cosmetic surgery   . Tubal ligation    History   Social History  . Marital Status: Single    Spouse Name: N/A    Number of Children: N/A  . Years of Education: N/A   Social History Main Topics  . Smoking status: Never Smoker   . Smokeless tobacco: None  . Alcohol Use: None  . Drug Use: None  . Sexually Active: None   Other Topics Concern  . None   Social History Narrative  . None   Family History  Problem Relation Age of Onset  . Seizures Mother   . ALS Mother    No Known Allergies Prior to Admission medications   Medication Sig Start Date End Date Taking? Authorizing Provider  cetirizine (ZYRTEC) 10 MG tablet Take 10 mg by mouth daily.   Yes Historical Provider, MD  warfarin (COUMADIN) 5 MG tablet Use as directed 05/12/12  Yes Sherren Mocha, MD  ipratropium (ATROVENT) 0.06 % nasal spray Use 2 sprays in each nostril four  times daily as needed for congestion. 05/04/12   Sherren Mocha, MD     ROS: The patient denies night sweats, unintentional weight loss, chest pain, palpitations, wheezing, dyspnea on exertion, vomiting, abdominal pain, dysuria, hematuria, melena, numbness, or tingling.  All other systems have been reviewed and were otherwise negative with the exception of those mentioned in the HPI and as above.    PHYSICAL EXAM: Filed Vitals:   06/20/12 1717  BP: 130/82  Pulse: 96  Temp: 98.8 F (37.1 C)  Resp: 18  Spo2                  98% Filed Vitals:   06/20/12 1717  Weight: 137 lb (62.143 kg)   There is no height on file to calculate BMI.  General: Alert, no acute distress HEENT:  Normocephalic, atraumatic, oropharynx patent. TM nl. + sinus pressure. NO exudates, erythematous throat. Boggy nares, no bleeding, gums show n/o bleeding Cardiovascular:  Regular rate and rhythm, no rubs murmurs or gallops.  No Carotid bruits, radial pulse intact. No pedal edema.  Respiratory: Clear to auscultation bilaterally.  No wheezes, rales, or rhonchi.  No  cyanosis, no use of accessory musculature GI: No organomegaly, abdomen is soft and non-tender, positive bowel sounds.  No masses. Skin: No rashes. Neurologic: Facial musculature symmetric. Psychiatric: Patient is appropriate throughout our interaction. Lymphatic: No cervical lymphadenopathy Musculoskeletal: Gait intact. Tenderness in right calf no warmth, no erythema, no asymmetry.  Neg Homan's Gu-no masses, + external hemorrhoids, no bleeding  LABS: Results for orders placed in visit on 06/20/12  POCT CBC      Component Value Range   WBC 8.0  4.6 - 10.2 K/uL   Lymph, poc 1.2  0.6 - 3.4   POC LYMPH PERCENT 14.9  10 - 50 %L   MID (cbc) 0.5  0 - 0.9   POC MID % 6.4  0 - 12 %M   POC Granulocyte 6.3  2 - 6.9   Granulocyte percent 78.7  37 - 80 %G   RBC 4.09  4.04 - 5.48 M/uL   Hemoglobin 12.5  12.2 - 16.2 g/dL   HCT, POC 16.1  09.6 - 47.9 %   MCV  96.4  80 - 97 fL   MCH, POC 30.6  27 - 31.2 pg   MCHC 31.7 (*) 31.8 - 35.4 g/dL   RDW, POC 04.5     Platelet Count, POC 371  142 - 424 K/uL   MPV 8.6  0 - 99.8 fL  IFOBT (OCCULT BLOOD)      Component Value Range   IFOBT Negative       EKG/XRAY:   Primary read interpreted by Dr. Conley Rolls at [facility].   ASSESSMENT/PLAN: Encounter Diagnoses  Name Primary?  . Nausea Yes  . Headache   . DVT (deep venous thrombosis)   . Encounter for monitoring coumadin therapy   . Hematochezia   . Flu-like symptoms   . Cough     Will presumptively treat for flu like sxs, patient declined flu test Rectal exam was neg for occult blood, no masses No overt signs of bleeding, ecchymosis Will rx tessalon perles and hydromet syrup for cough I will check her INR since she has not had one in 1 month, she states she had some nose bleeds and rectal bleeding and was told to come back but never did to get her INR levels rechecked and to get a rectal exam. I saw a hemorrhoid on her rectal exam , IFOBT was negative, she does have some erythematous nasal passage and irritation along the mucosa but no e/o bleeding. Patient states she is compliant with coumadin but the history is a little vague.  F/u in 1 month with Dr. Clelia Croft for INR recheck IF INR not at goal then needs to be re-evaluated in 3-4 days.    LE, THAO PHUONG, DO 06/20/2012 7:17 PM

## 2012-06-21 LAB — PROTIME-INR
INR: 1.01 (ref <1.50)
Prothrombin Time: 13.3 seconds (ref 11.6–15.2)

## 2012-06-22 ENCOUNTER — Telehealth: Payer: Self-pay

## 2012-06-22 ENCOUNTER — Encounter (HOSPITAL_COMMUNITY): Payer: Self-pay | Admitting: *Deleted

## 2012-06-22 ENCOUNTER — Other Ambulatory Visit: Payer: Self-pay | Admitting: Family Medicine

## 2012-06-22 ENCOUNTER — Emergency Department (HOSPITAL_COMMUNITY)
Admission: EM | Admit: 2012-06-22 | Discharge: 2012-06-23 | Disposition: A | Discharge status: Home / Self Care | Payer: Self-pay | Attending: Emergency Medicine | Admitting: Emergency Medicine

## 2012-06-22 ENCOUNTER — Telehealth: Payer: Self-pay | Admitting: Family Medicine

## 2012-06-22 DIAGNOSIS — I82509 Chronic embolism and thrombosis of unspecified deep veins of unspecified lower extremity: Secondary | ICD-10-CM | POA: Insufficient documentation

## 2012-06-22 DIAGNOSIS — Z86718 Personal history of other venous thrombosis and embolism: Secondary | ICD-10-CM | POA: Insufficient documentation

## 2012-06-22 DIAGNOSIS — R791 Abnormal coagulation profile: Secondary | ICD-10-CM | POA: Insufficient documentation

## 2012-06-22 DIAGNOSIS — I82409 Acute embolism and thrombosis of unspecified deep veins of unspecified lower extremity: Secondary | ICD-10-CM

## 2012-06-22 DIAGNOSIS — R0602 Shortness of breath: Secondary | ICD-10-CM | POA: Insufficient documentation

## 2012-06-22 DIAGNOSIS — Z87891 Personal history of nicotine dependence: Secondary | ICD-10-CM | POA: Insufficient documentation

## 2012-06-22 DIAGNOSIS — R071 Chest pain on breathing: Secondary | ICD-10-CM | POA: Insufficient documentation

## 2012-06-22 DIAGNOSIS — R05 Cough: Secondary | ICD-10-CM | POA: Insufficient documentation

## 2012-06-22 DIAGNOSIS — R059 Cough, unspecified: Secondary | ICD-10-CM | POA: Insufficient documentation

## 2012-06-22 DIAGNOSIS — D571 Sickle-cell disease without crisis: Secondary | ICD-10-CM | POA: Insufficient documentation

## 2012-06-22 DIAGNOSIS — D689 Coagulation defect, unspecified: Secondary | ICD-10-CM | POA: Insufficient documentation

## 2012-06-22 DIAGNOSIS — Z7901 Long term (current) use of anticoagulants: Secondary | ICD-10-CM | POA: Insufficient documentation

## 2012-06-22 DIAGNOSIS — Z79899 Other long term (current) drug therapy: Secondary | ICD-10-CM | POA: Insufficient documentation

## 2012-06-22 LAB — CBC WITH DIFFERENTIAL/PLATELET
Basophils Absolute: 0 10*3/uL (ref 0.0–0.1)
Basophils Relative: 1 % (ref 0–1)
Eosinophils Absolute: 0.4 10*3/uL (ref 0.0–0.7)
MCH: 31.5 pg (ref 26.0–34.0)
MCHC: 34.7 g/dL (ref 30.0–36.0)
Neutro Abs: 2.7 10*3/uL (ref 1.7–7.7)
Neutrophils Relative %: 50 % (ref 43–77)
Platelets: 326 10*3/uL (ref 150–400)
RDW: 13.4 % (ref 11.5–15.5)

## 2012-06-22 LAB — APTT: aPTT: 31 seconds (ref 24–37)

## 2012-06-22 MED ORDER — WARFARIN SODIUM 5 MG PO TABS: ORAL_TABLET | ORAL | Status: DC

## 2012-06-22 MED ORDER — ENOXAPARIN SODIUM 60 MG/0.6ML ~~LOC~~ SOLN: 60.0000 mg | Freq: Two times a day (BID) | SUBCUTANEOUS | Status: DC

## 2012-06-22 MED ORDER — SODIUM CHLORIDE 0.9 % IV SOLN
INTRAVENOUS | Status: DC
  Administered 2012-06-22: 23:00:00 via INTRAVENOUS

## 2012-06-22 MED ORDER — ENOXAPARIN SODIUM 60 MG/0.6ML ~~LOC~~ SOLN
1.0000 mg/kg | Freq: Once | SUBCUTANEOUS | Status: AC
  Administered 2012-06-23: 60 mg via SUBCUTANEOUS
  Filled 2012-06-22: qty 0.6

## 2012-06-22 NOTE — Telephone Encounter (Signed)
PATIENT'S DAUGHTER (Marisa Fuller) STATES DR. LE TRIED TO CALL HER MOTHER THIS MORNING ABOUT 8:15 A.M. Marisa Fuller IS RETURNING HER CALL BECAUSE HER MOTHER IS SLEEPING DUE TO THE STRONG MEDICATION SHE IS TAKING. BEST PHONE [phone] (Marisa Fuller'S CELL)    PHARMACY CHOICE IS [facility] ON [address].   MBC

## 2012-06-22 NOTE — Telephone Encounter (Signed)
LM to ask her to ask her mom to call me regarding labs

## 2012-06-22 NOTE — Telephone Encounter (Signed)
PT HERSELF CALLED AND STATED DR LE HAD GIVEN HER A CALL. PLEASE CALL [phone]

## 2012-06-22 NOTE — Telephone Encounter (Signed)
Left message that INR is not at goal. She needs to take warfarin if she is not doing so. I also would like her to take her Lovenox until her INR is at goal. I left a message on her cell phone, her home phone may be a fax number. I will try calling her daughter.

## 2012-06-22 NOTE — Telephone Encounter (Signed)
Patient gets the Lovenox injections from [facility] in the hospital. She states she can get them at no cost since she has no health care coverage. I have called the pharmacy to get these for her. Unfortunately she has to reapply for the program through a case manager. The case manager # [phone]. I have spoken to Paul, the program has recently changed. She will have to reapply for this. French Ana is sending me the information so we can get this filled out for her. I will fill it out and have you sign this.

## 2012-06-22 NOTE — Telephone Encounter (Signed)
Spoke with Marisa Fuller and also nurse case manager Traci Johnson tonight . Forms for Lovenox were filled and faxed to Traci but they are the forms for Lovenox for patient assistance from the manufacturer of Lovenox, [employer]. The forms given to [place] are not for the [facility] patient assistance program. Apparently the medication assistance program at [facility] has changed since last year 2013 due to budget cuts, patients need to apply and it may take several days to get approval, it is a match program, the medicines regardless if one or ten meds will be covered and it is only allowed 1x per year. Traci will follow-up with her supervisor to see what can be doen for Marisa Fuller and she will call Marisa Fuller  I have advise patient to go to the ER to get her Lovenox injections since she cannot afford them out of pocket. We will work on getting her forms for medication assistance through [facility]. INR is subtherapeutic, patient is to continue to be on coumadin 5 mg. She will return to get her INR checked at our office in 3 days. Advise to get tamiflu. Apparently she did not get the medication filled because pharmacy did not have.

## 2012-06-22 NOTE — ED Provider Notes (Signed)
History     CSN: [id number]  Arrival date & time 06/22/12  1925   First MD Initiated Contact with Patient 06/22/12 2207      Chief Complaint  Patient presents with  . DVT    known and possible    (Consider location/radiation/quality/duration/timing/severity/associated sxs/prior treatment) The history is provided by the patient.   Patient here with left lower extremity DVT x3 months. Saw her Dr. today and is subtherapeutic INR and do to financial reasons was sent here to have a Lovenox shot. Upon further questioning, patient states that she has chronic shortness of breath which she feels has been worse. Also notes chest pain worse with cough. States that she is concerned that she might have a blood clot in her lung. Denies any syncope or near-syncope. Patient scheduled to receive her Lovenox injections tomorrow issues are to Coumadin Past Medical History  Diagnosis Date  . Anemia     sickle cell trait  . Sickle cell anemia     trait  . Allergy   . Clotting disorder     Past Surgical History  Procedure Date  . Breast surgery   . Cosmetic surgery   . Tubal ligation     Family History  Problem Relation Age of Onset  . Seizures Mother   . ALS Mother     History  Substance Use Topics  . Smoking status: Former Games developer  . Smokeless tobacco: Not on file  . Alcohol Use: Yes    OB History    Grav Para Term Preterm Abortions TAB SAB Ect Mult Living                  Review of Systems  All other systems reviewed and are negative.    Allergies  Review of patient's allergies indicates no known allergies.  Home Medications   Current Outpatient Rx  Name  Route  Sig  Dispense  Refill  . BENZONATATE 100 MG PO CAPS   Oral   Take 2 capsules (200 mg total) by mouth 2 (two) times daily as needed for cough.   30 capsule   1   . CETIRIZINE HCL 10 MG PO TABS   Oral   Take 10 mg by mouth daily.         [name] ENOXAPARIN SODIUM 60 MG/0.6ML [place] SOLN   Subcutaneous   Inject  0.6 mLs (60 mg total) into the skin every 12 (twelve) hours.   14 Syringe   0     Please return to get your INR rechecked in 3 days. ...   . HYDROCODONE-HOMATROPINE 5-1.5 MG/5ML PO SYRP   Oral   Take 5 mLs by mouth every 8 (eight) hours as needed for cough.   180 mL   0   . IPRATROPIUM BROMIDE 0.06 % NA SOLN      Use 2 sprays in each nostril four times daily as needed for congestion.   15 mL   11   . OSELTAMIVIR PHOSPHATE 75 MG PO CAPS   Oral   Take 1 capsule (75 mg total) by mouth 2 (two) times daily.   10 capsule   0   . WARFARIN SODIUM 5 MG PO TABS      Use as directed   30 tablet   2     BP 158/90  Pulse 88  Temp 97.9 F (36.6 C) (Oral)  Resp 18  Ht 5\' 6"  (1.676 m)  Wt 128 lb (58.06 kg)  BMI 20.66  kg/m2  SpO2 100%  Physical Exam  Nursing note and vitals reviewed. Constitutional: She is oriented to person, place, and time. She appears well-developed and well-nourished.  Non-toxic appearance. No distress.  HENT:  Head: Normocephalic and atraumatic.  Eyes: Conjunctivae normal, EOM and lids are normal. Pupils are equal, round, and reactive to light.  Neck: Normal range of motion. Neck supple. No tracheal deviation present. No mass present.  Cardiovascular: Normal rate, regular rhythm and normal heart sounds.  Exam reveals no gallop.   No murmur heard. Pulmonary/Chest: Effort normal and breath sounds normal. No stridor. No respiratory distress. She has no decreased breath sounds. She has no wheezes. She has no rhonchi. She has no rales.  Abdominal: Soft. Normal appearance and bowel sounds are normal. She exhibits no distension. There is no tenderness. There is no rebound and no CVA tenderness.  Musculoskeletal: Normal range of motion. She exhibits no edema and no tenderness.  Neurological: She is alert and oriented to person, place, and time. She has normal strength. No cranial nerve deficit or sensory deficit. GCS eye subscore is 4. GCS verbal subscore is 5. GCS  motor subscore is 6.  Skin: Skin is warm and dry. No abrasion and no rash noted.  Psychiatric: She has a normal mood and affect. Her speech is normal and behavior is normal.    ED Course  Procedures (including critical care time)   Labs Reviewed  APTT  PROTIME-INR  CBC WITH DIFFERENTIAL  BASIC METABOLIC PANEL   No results found.   No diagnosis found.    MDM   Date: 06/22/2012  Rate: 84  Rhythm: normal sinus rhythm  QRS Axis: normal  Intervals: normal  ST/T Wave abnormalities: normal  Conduction Disutrbances:none  Narrative Interpretation:   Old EKG Reviewed: none available  11:50 PM Patient given dose of Lovenox here. She will have a CT of her chest to rule out PE. Patient signed out to Dr. Iona Beard, MD 06/22/12 2350

## 2012-06-22 NOTE — ED Notes (Signed)
Pt states that her PCP Nedra Hai said that her INR was not thinned enough and she has a known left leg clot. Pt states the clot has been there since Oct. Pt states record indicate that her INR at her PCP Saturday was 1. Something and it needs to be above 2.0. Pt told to come here for lovenox shot.

## 2012-06-23 ENCOUNTER — Emergency Department (HOSPITAL_COMMUNITY): Payer: Self-pay

## 2012-06-23 ENCOUNTER — Telehealth: Payer: Self-pay | Admitting: Radiology

## 2012-06-23 DIAGNOSIS — Z7901 Long term (current) use of anticoagulants: Secondary | ICD-10-CM

## 2012-06-23 DIAGNOSIS — I82409 Acute embolism and thrombosis of unspecified deep veins of unspecified lower extremity: Secondary | ICD-10-CM

## 2012-06-23 LAB — BASIC METABOLIC PANEL
BUN: 11 mg/dL (ref 6–23)
GFR calc Af Amer: >90 mL/min (ref >90)
GFR calc non Af Amer: 82 mL/min — ABNORMAL LOW (ref >90)
Potassium: 3.4 mEq/L — ABNORMAL LOW (ref 3.5–5.1)
Sodium: 138 mEq/L (ref 135–145)

## 2012-06-23 MED ORDER — IOHEXOL 350 MG/ML SOLN
100.0000 mL | Freq: Once | INTRAVENOUS | Status: AC | PRN
  Administered 2012-06-23: 100 mL via INTRAVENOUS

## 2012-06-23 MED ORDER — IOHEXOL 300 MG/ML  SOLN: 100.0000 mL | Freq: Once | INTRAMUSCULAR | Status: DC | PRN

## 2012-06-23 MED ORDER — WARFARIN SODIUM 5 MG PO TABS: 5.0000 mg | ORAL_TABLET | Freq: Every day | ORAL | Status: DC

## 2012-06-23 MED ORDER — ENOXAPARIN SODIUM 60 MG/0.6ML ~~LOC~~ SOLN: 60.0000 mg | Freq: Two times a day (BID) | SUBCUTANEOUS | Status: DC

## 2012-06-23 NOTE — ED Notes (Addendum)
CT called and informed that the results from the BMET were needed. Lab called and informed.

## 2012-06-23 NOTE — Telephone Encounter (Signed)
Patient has called back, she is asking about Tamiflu, she wants to know if she can get this now. I have advised her since she has been sick for 3 days, it is not likely to be much benefit. She wants to know if she can get this and keep it on hand for future use, since she has only this one opportunity to use this MATCH program. Please advise.

## 2012-06-23 NOTE — Telephone Encounter (Signed)
I did get the information from the case manager, need to discuss with Dr Conley Rolls, how much does she want [place] to send in for her, she can get a 1 mo supply?

## 2012-06-23 NOTE — ED Notes (Signed)
CT called and informed results from pts BMET had resulted

## 2012-06-23 NOTE — Telephone Encounter (Signed)
She qualified for the [facility] program she gets one time Rx for her meds through her pharmacy. Sent in supply for her of Lovenox and Coumadin. She was advised of this and she does plan to come in on Thursday for the office visit only. Thanks Chantelle Verdi

## 2012-06-23 NOTE — Telephone Encounter (Signed)
I spoke to the case manager, now that patient has been seen in the ER she qualifies for assistance, with her prescriptions one time in the calendar year. She is sending me the paper work. She states ENTIRE form must be filled out. She states also any of her meds she needs to get there will be a $3 copay on all her meds. She has to use the pharmacy on the list provided. I am waiting for the form.

## 2012-06-24 NOTE — Telephone Encounter (Signed)
Went ahead and rx Tamiflu. She has gotten her Lovenox and also her warfarin, has been taking it, no bleeding, she will f/u on Thursday or Friday this week to get PT/INR only, no office visit. Just lab draw. If she is turned away from our office due to billing issues I have advised her to go to Er to get her PT/INR drawn.

## 2012-06-24 NOTE — Care Management Note (Signed)
Page 1 of 2   06/23/2012     2:28:44 PM   CARE MANAGEMENT NOTE 06/23/2012  Patient:  SAMAYA, BOARDLEY   Account Number:  [id number]  Date Initiated:  06/23/2012  Documentation initiated by:  Roseanne Reno  Subjective/Objective Assessment:   Nausea, Headache, DVT (deep venous thrombosis), Encounter for monitoring coumadin therapy, Hematochezia, Flu-like symptoms, Cough.     Action/Plan:   Medication assistance for Lovenox   Anticipated DC Date:  06/23/2012   Anticipated DC Plan:  HOME/SELF CARE      DC Planning Services  Medication Assistance      Discharge Disposition:  HOME/SELF CARE  Comments:   06/24/11  1400p  CM received a call from Amy stating that she had never received the faxed letter and pharmacy list.  CM faxed this information again to Amy at [phone] and called to verify that she did receive that.  Amy was also expecting some information that she would need to complete and give to the pt.  CM explained that the [facility] letter was the only thing that the pt would need to take to the pharmacy along w/ her Rx's.  Again, she must go to a pharmacy listed in the information w/ the [facility] letter. Amy stated that the pt did have the letter and would go to a pharmacy on the list.  Amy had no other questions at this time.  Hessie Diener   [phone]   06/24/11  800a  CM reviewed EPIC and noted that pt was seen in the ED last night.  Discussed this with CM Manager and now that pt has been to a [facility] facility (not inculding Physician offices) we (CM) would be able to offer assistance through the [facility] program.  CM received a call from Amy at [facility].  CM explained the Lovenox Application process for future reference (Application must be completely filled out and faxed to [employer] and not CM).  CM also explained that the [facility] assistance program is offered for [facility] facilty pts and has not been extended to the physician offices at this time. Given that the pt was seen in the ED,  the [facility] assist program was completed for the pt by this CM and the letter and list of pharmacies was faxed to Amy at [phone]. Explained in detail to Amy that this was a one time per 12 month period for meds assist, she has 7 days from today to have Rx's filled and that there is a $3.00 copay per Rx and the pt must use one of the pharmacies listed.  Amy voiced understanding.  TJohnson, RNBSN   06/22/12  1750p  CM received a call from Dr. Conley Rolls at [facility] wanting to know the status of the Lovenox Application and if we were processing it.  I explained that we (CM) did not process the application but it would be processed by the Lovenox Assistance Program and that they [facility]) could follow up with them in the am and hopefully expidite the process.  Dr. Conley Rolls has encouraged the pt to go to the ED for her Lovenox injection today.  I will follow up w/ CM Manager in am regarding any further CM assistance. TJohnson, RNBSN   06/22/12  1350p  CM received phone call from Amy Litrell at [facility] [phone]) stating she needed assistance in obtaining this pt's Lovenox.  Pt states that she has gotten the Rx from [facility] pharmacy in the past  at no cost to her.  Pt is seen by [facility]  for her primary care.  After reviewing [facility] and EPIC, it is noted that there are no [facility] encounters since 2011.  Discussed w/ CM Manager and at this time we can give the office a  Lovenox Application to complete and fax back to [employer] to see is the pt would qualify for their assistance. CM faxed the Lovenox Application to Amy at [phone].  TJohnson, RNBSN

## 2012-06-25 ENCOUNTER — Telehealth: Payer: Self-pay

## 2012-06-25 ENCOUNTER — Other Ambulatory Visit (INDEPENDENT_AMBULATORY_CARE_PROVIDER_SITE_OTHER): Payer: Self-pay

## 2012-06-25 DIAGNOSIS — O2230 Deep phlebothrombosis in pregnancy, unspecified trimester: Secondary | ICD-10-CM

## 2012-06-25 DIAGNOSIS — I82409 Acute embolism and thrombosis of unspecified deep veins of unspecified lower extremity: Secondary | ICD-10-CM

## 2012-06-25 DIAGNOSIS — Z7901 Long term (current) use of anticoagulants: Secondary | ICD-10-CM

## 2012-06-25 NOTE — Telephone Encounter (Signed)
Error

## 2012-06-26 ENCOUNTER — Telehealth: Payer: Self-pay | Admitting: Family Medicine

## 2012-06-26 DIAGNOSIS — Z7901 Long term (current) use of anticoagulants: Secondary | ICD-10-CM

## 2012-06-26 LAB — PROTIME-INR
INR: 1.26 (ref <1.50)
Prothrombin Time: 15.7 seconds — ABNORMAL HIGH (ref 11.6–15.2)

## 2012-06-27 NOTE — Telephone Encounter (Signed)
Spoke with pt regarding subtherapeutic INR. 1.26. Still needs to be on LOvenox for next 7 days and coumadin 5 mg and then will repeat INR in 4 days.

## 2012-06-30 ENCOUNTER — Other Ambulatory Visit (INDEPENDENT_AMBULATORY_CARE_PROVIDER_SITE_OTHER): Payer: Self-pay | Admitting: Family Medicine

## 2012-06-30 VITALS — BP 126/66 | HR 80 | Temp 98.4°F | Resp 16 | Ht 65.0 in | Wt 140.0 lb

## 2012-06-30 DIAGNOSIS — Z7901 Long term (current) use of anticoagulants: Secondary | ICD-10-CM

## 2012-06-30 LAB — PROTIME-INR
INR: 1.79 — ABNORMAL HIGH (ref <1.50)
Prothrombin Time: 20.3 seconds — ABNORMAL HIGH (ref 11.6–15.2)

## 2012-06-30 NOTE — Progress Notes (Signed)
Needs lab only

## 2012-07-01 ENCOUNTER — Other Ambulatory Visit: Payer: Self-pay | Admitting: Family Medicine

## 2012-07-01 DIAGNOSIS — I82409 Acute embolism and thrombosis of unspecified deep veins of unspecified lower extremity: Secondary | ICD-10-CM

## 2012-07-08 ENCOUNTER — Telehealth: Payer: Self-pay | Admitting: Family Medicine

## 2012-07-08 NOTE — Telephone Encounter (Signed)
Spoke with patietn today. She was supposed to come in for an INR check on 1/20 but did not. Eleanora Neighbor had called her about her INR being slighlty subtherapeutic at that time. She also asked Mrs. Tengan to increae her warfarin to 7.5 mg daily instead of the 5 mg. She did not have any bleeding at the time. Again, I am not sure how compliant she is since she can't tell me how many mg she is supposed to be taking. I have called her to remind her that she needs to come in for her PT/INR check. This is lab only.

## 2012-07-09 ENCOUNTER — Other Ambulatory Visit (INDEPENDENT_AMBULATORY_CARE_PROVIDER_SITE_OTHER): Payer: Self-pay | Admitting: Family Medicine

## 2012-07-09 ENCOUNTER — Other Ambulatory Visit: Payer: Self-pay | Admitting: Physician Assistant

## 2012-07-09 VITALS — BP 134/78 | HR 99 | Temp 98.5°F | Resp 17 | Ht 66.0 in | Wt 144.0 lb

## 2012-07-09 DIAGNOSIS — I749 Embolism and thrombosis of unspecified artery: Secondary | ICD-10-CM

## 2012-07-09 DIAGNOSIS — I82409 Acute embolism and thrombosis of unspecified deep veins of unspecified lower extremity: Secondary | ICD-10-CM

## 2012-07-09 LAB — PROTIME-INR
INR: 3.56 — ABNORMAL HIGH (ref <1.50)
Prothrombin Time: 33.8 s — ABNORMAL HIGH (ref 11.6–15.2)

## 2012-07-10 ENCOUNTER — Telehealth: Payer: Self-pay | Admitting: Family Medicine

## 2012-07-10 DIAGNOSIS — O2230 Deep phlebothrombosis in pregnancy, unspecified trimester: Secondary | ICD-10-CM

## 2012-07-10 DIAGNOSIS — Z7901 Long term (current) use of anticoagulants: Secondary | ICD-10-CM

## 2012-07-10 NOTE — Telephone Encounter (Signed)
Spoke to patient about INR being 3.56. She should go back down to 5 mg. Roughly 20% derease from her currently does of 7.5 mg daily.  Currently not bruising/bleeding. Gave precautions to be careful with that. She will return in 1 week to get her INR rechecked, again labs only since patient is in financial difficulty with medical bills.

## 2012-07-16 ENCOUNTER — Telehealth: Payer: Self-pay

## 2012-07-16 MED ORDER — FLUTICASONE PROPIONATE 50 MCG/ACT NA SUSP: 2.0000 | Freq: Every day | NASAL | Status: DC

## 2012-07-16 NOTE — Telephone Encounter (Signed)
She wants it sent to [facility] resubmitted to [facility], cancelled at [facility].

## 2012-07-16 NOTE — Telephone Encounter (Signed)
Patient states she needs a steroid nasal spray, not the one prescribed, per the pharmacist.   Pharmacy:  [facility] outpatient pharmacy   Cbn:  [phone]

## 2012-07-16 NOTE — Telephone Encounter (Signed)
I spoke to her and she states she needs something other than the Atrovent nasal spray. She has been using afrin (3 months) for long duration and is trying to d/c this and is very congested. Please advise.

## 2012-07-16 NOTE — Telephone Encounter (Signed)
Per Dr Conley Rolls, okay for Flonase, patient advised to d/c immediately if she gets any bleeding. She is also advised to try saline nasal spray daily.

## 2012-07-16 NOTE — Addendum Note (Signed)
Addended byCaffie Damme on: 07/16/2012 12:20 PM   Modules accepted: Orders

## 2012-07-17 ENCOUNTER — Ambulatory Visit (INDEPENDENT_AMBULATORY_CARE_PROVIDER_SITE_OTHER): Payer: Self-pay | Admitting: Family Medicine

## 2012-07-17 VITALS — BP 150/72 | HR 101 | Temp 97.6°F | Resp 16

## 2012-07-17 DIAGNOSIS — I82409 Acute embolism and thrombosis of unspecified deep veins of unspecified lower extremity: Secondary | ICD-10-CM

## 2012-07-17 DIAGNOSIS — Z7901 Long term (current) use of anticoagulants: Secondary | ICD-10-CM

## 2012-07-17 DIAGNOSIS — O2230 Deep phlebothrombosis in pregnancy, unspecified trimester: Secondary | ICD-10-CM

## 2012-07-17 LAB — PROTIME-INR
INR: 2.32 — ABNORMAL HIGH (ref <1.50)
Prothrombin Time: 24.6 seconds — ABNORMAL HIGH (ref 11.6–15.2)

## 2012-07-18 ENCOUNTER — Telehealth: Payer: Self-pay | Admitting: Family Medicine

## 2012-07-18 DIAGNOSIS — I82409 Acute embolism and thrombosis of unspecified deep veins of unspecified lower extremity: Secondary | ICD-10-CM

## 2012-07-18 DIAGNOSIS — O2230 Deep phlebothrombosis in pregnancy, unspecified trimester: Secondary | ICD-10-CM

## 2012-07-18 NOTE — Telephone Encounter (Signed)
LM that INR therapeutic. Needs to come in for INR in one month from 07/18/2011. Orders already placed. Continued with Coumadin 5 mg daily. IF she has bleeding need to call us.

## 2012-07-19 ENCOUNTER — Telehealth: Payer: Self-pay | Admitting: Family Medicine

## 2012-07-19 NOTE — Telephone Encounter (Signed)
Patient called regarding that her other leg hurts, she is SOB walking from front door to mailbox. Does not feel herself. Advise that she f/u with [place], go to ER prn. She will try to come in on Wednesday when I am here. 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

## 2012-07-20 NOTE — Progress Notes (Signed)
  Subjective:    Patient ID: Marisa Fuller, female    DOB: 1961-02-18, 52 y.o.   MRN: [id number]  HPI    Review of Systems     Objective:   Physical Exam        Assessment & Plan:  INR check

## 2012-08-01 ENCOUNTER — Other Ambulatory Visit: Payer: Self-pay

## 2012-08-10 ENCOUNTER — Encounter: Payer: Self-pay | Admitting: Family Medicine

## 2012-08-10 ENCOUNTER — Other Ambulatory Visit: Payer: Self-pay | Admitting: Family Medicine

## 2012-08-10 VITALS — BP 125/70 | HR 80 | Temp 98.5°F | Resp 18 | Wt 130.0 lb

## 2012-08-10 DIAGNOSIS — I82409 Acute embolism and thrombosis of unspecified deep veins of unspecified lower extremity: Secondary | ICD-10-CM

## 2012-08-10 DIAGNOSIS — R110 Nausea: Secondary | ICD-10-CM

## 2012-08-10 DIAGNOSIS — Z131 Encounter for screening for diabetes mellitus: Secondary | ICD-10-CM

## 2012-08-10 DIAGNOSIS — R5381 Other malaise: Secondary | ICD-10-CM

## 2012-08-10 DIAGNOSIS — R5383 Other fatigue: Secondary | ICD-10-CM

## 2012-08-10 LAB — POCT CBC
Granulocyte percent: 61.9 % (ref 37–80)
HCT, POC: 38.8 % (ref 37.7–47.9)
Hemoglobin: 12.4 g/dL (ref 12.2–16.2)
Lymph, poc: 2.1 (ref 0.6–3.4)
MCH, POC: 30.5 pg (ref 27–31.2)
MCHC: 32 g/dL (ref 31.8–35.4)
MCV: 95.3 fL (ref 80–97)
MID (cbc): 0.4 (ref 0–0.9)
MPV: 8.2 fL (ref 0–99.8)
POC Granulocyte: 4 (ref 2–6.9)
POC LYMPH PERCENT: 32.2 % (ref 10–50)
POC MID %: 5.9 % (ref 0–12)
Platelet Count, POC: 388 10*3/uL (ref 142–424)
RBC: 4.07 M/uL (ref 4.04–5.48)
RDW, POC: 14 %
WBC: 6.4 10*3/uL (ref 4.6–10.2)

## 2012-08-10 LAB — POCT GLYCOSYLATED HEMOGLOBIN (HGB A1C): Hemoglobin A1C: 6

## 2012-08-10 MED ORDER — ONDANSETRON HCL 4 MG PO TABS: 4.0000 mg | ORAL_TABLET | Freq: Three times a day (TID) | ORAL | Status: DC | PRN

## 2012-08-10 NOTE — Progress Notes (Signed)
[facility]:  Office Visit  Chief Complaint:  Chief Complaint  Patient presents with  . DVT  . Fatigue  . Nausea    HPI: Marisa Fuller who complains of:  Nausea since the end of January, feels fatigued, she has DOE which she did not have prior to getting the blood clot, she also pain in bilateral Marisa Fuller. She has pain in both legs. Constant nausea not associated with food. She feels "the life is being sucked out of her".  Walking building to the next at [facility] causes her to be SOB, tired. She was recently at the end of Nov dx with a aleft DVT and also had the flu in December. Last INR was therapeutic at 2.32. She denies any gum bleeding, easy bruising, blood in urine or stool.   She has been under a lot of stress, father is hospitalized due to illness. However she "has never felt better mentally"  And she knows how to handle stress. She has a h/o reflux but knows what to do and avoids all GERD stimualting foods.   She got her flu shot recently after having the flu.  Past Medical History  Diagnosis Date  . Anemia     sickle cell trait  . Sickle cell anemia     trait  . Allergy   . Clotting disorder   . GERD (gastroesophageal reflux disease)    Past Surgical History  Procedure Laterality Date  . Breast surgery    . Cosmetic surgery    . Tubal ligation     History   Social History  . Marital Status: Single    Spouse Name: N/A    Number of Children: N/A  . Years of Education: N/A   Social History Main Topics  . Smoking status: Former Games developer  . Smokeless tobacco: None  . Alcohol Use: Yes  . Drug Use: No  . Sexually Active: None   Other Topics Concern  . None   Social History Narrative  . None   Family History  Problem Relation Age of Onset  . Seizures Mother   . ALS Mother    No Known Allergies Prior to Admission medications   Medication Sig Start Date End Date Taking? Authorizing Provider  Ascorbic Acid (VITAMIN  C PO) Take 1 tablet by mouth once.    Historical Provider, MD  Camphor-Eucalyptus-Menthol (VICKS VAPORUB EX) Apply 1 application topically 2 (two) times daily as needed. For sleep    Historical Provider, MD  cetirizine (ZYRTEC) 10 MG tablet Take 10 mg by mouth daily.    Historical Provider, MD  enoxaparin (LOVENOX) 60 MG/0.6ML injection Inject 0.6 mLs (60 mg total) into the skin every 12 (twelve) hours. 06/23/12   Marisa Fuller P Marisa Rings, DO  fluticasone (FLONASE) 50 MCG/ACT nasal spray Place 2 sprays into the nose daily. 07/16/12   Marisa Fuller P Marisa Alperin, DO  HYDROcodone-homatropine (HYCODAN) 5-1.5 MG/5ML syrup Take 5 mLs by mouth every 8 (eight) hours as needed for cough. 06/20/12   Marisa Fuller P Marisa Charrette, DO  ibuprofen (ADVIL,MOTRIN) 200 MG tablet Take 400 mg by mouth every 6 (six) hours as needed. For pain    Historical Provider, MD  Multiple Vitamin (MULTIVITAMIN WITH MINERALS) TABS Take 1 tablet by mouth daily.    Historical Provider, MD  warfarin (COUMADIN) 5 MG tablet Take 1 tablet (5 mg total) by mouth daily. Use as directed 06/23/12   Marisa Fuller P Marisa Daleo, DO     ROS:  The patient denies fevers, chills, night sweats, unintentional weight loss, chest pain, palpitations, wheezing, vomiting, abdominal pain, dysuria, hematuria, melena, numbness,  or tingling. + weakness, increase thirst   All other systems have been reviewed and were otherwise negative with the exception of those mentioned in the HPI and as above.    PHYSICAL EXAM: Filed Vitals:   08/10/12 1911  BP: 125/70  Pulse: 80  Temp: 98.5 F (36.9 C)  Resp: 18  SPo2   99% Filed Vitals:   08/10/12 1911  Weight: 130 lb (58.968 kg)   Body mass index is 20.99 kg/(m^2).  General: Alert, no acute distress HEENT:  Normocephalic, atraumatic, oropharynx patent.  Cardiovascular:  Regular rate and rhythm, no rubs murmurs or gallops.  No Carotid bruits, radial pulse intact. No pedal edema.  Respiratory: Clear to auscultation bilaterally.  No wheezes, rales, or rhonchi.  No cyanosis, no use  of accessory musculature GI: No organomegaly, abdomen is soft and non-tender, positive bowel sounds.  No masses. Skin: No rashes. Neurologic: Facial musculature symmetric. Psychiatric: Patient is appropriate throughout our interaction. Lymphatic: No cervical lymphadenopathy Musculoskeletal: Gait intact. No leg/calf swelling. ROM intat, She is tender to palpation bilaterally in Marisa Fuller. There is no asymmetry, no warmth in Marisa Fuller.    LABS: Results for orders placed in visit on 08/10/12  POCT CBC      Result Value Range   WBC 6.4  4.6 - 10.2 K/uL   Lymph, poc 2.1  0.6 - 3.4   POC LYMPH PERCENT 32.2  10 - 50 %L   MID (cbc) 0.4  0 - 0.9   POC MID % 5.9  0 - 12 %M   POC Granulocyte 4.0  2 - 6.9   Granulocyte percent 61.9  37 - 80 %G   RBC 4.07  4.04 - 5.48 M/uL   Hemoglobin 12.4  12.2 - 16.2 g/dL   HCT, POC 09.8  11.9 - 47.9 %   MCV 95.3  80 - 97 fL   MCH, POC 30.5  27 - 31.2 pg   MCHC 32.0  31.8 - 35.4 g/dL   RDW, POC 14.7     Platelet Count, POC 388  142 - 424 K/uL   MPV 8.2  0 - 99.8 fL  POCT GLYCOSYLATED HEMOGLOBIN (HGB A1C)      Result Value Range   Hemoglobin A1C 6.0       EKG/XRAY:   Primary read interpreted by Dr. Conley Rolls at [facility].   ASSESSMENT/PLAN: Encounter Diagnoses  Name Primary?  . DVT (deep venous thrombosis) Yes  . Screening for diabetes mellitus (DM)   . Other malaise and fatigue    Mrs. Slight is a very vibrant, somewhat cantankerous 52 y/o  AA Fuller who is here today for recheck of her INR and also with c/o SOB/DOE and fatigue and nausea . The nausea bothers her the most. Most of these sxs started after being dx with a left Taryn Shellhammer DVT at the end of Nv 2013 and then followed by flu sxs in December. She does not have insurance so we have been judicious per the patients understanding about labs.   1. Nausea-? GERD sx vs SE of coumadin. Prilosec samples given. Advise to take only 20 mg daily, this may increase her INR and increase risk of bleeding. Screened for diabetes at  patient request, HbA1c was WNL.   2. SOB/DOE/Fatigue-residual of effects of DVT, recent flu. CBC was WNL, last checked TSH was WNL. Her pulse ox is 99%.  Her chest exam was normal. She has a chest CT on 06/22/12 and it was negative for PE or PNA. She is already on coumadin and is therapeutic since we last checked. Will monitor sxs. F/u prn  3. DVT-check PT/INR. F/u in 1 month for labs if INR therapeutic.     Hamilton Capri PHUONG, DO 08/11/2012 3:35 PM

## 2012-08-11 LAB — PROTIME-INR
INR: 1.33 (ref <1.50)
Prothrombin Time: 16.3 s — ABNORMAL HIGH (ref 11.6–15.2)

## 2012-08-12 ENCOUNTER — Telehealth: Payer: Self-pay

## 2012-08-12 ENCOUNTER — Other Ambulatory Visit: Payer: Self-pay | Admitting: Family Medicine

## 2012-08-12 DIAGNOSIS — I82402 Acute embolism and thrombosis of unspecified deep veins of left lower extremity: Secondary | ICD-10-CM

## 2012-08-12 NOTE — Telephone Encounter (Signed)
Contacted pt and advised her that her INR is too low and we need to increase her dose of coumadin and have her RTC for repeat INR only next Wed. Verified that pt has 5 mg tabs and that she has been taking 1 tab QD. Instr'd pt of Dr Irwin Brakeman dosing instr's and pt voiced understanding and agreed.

## 2012-08-12 NOTE — Telephone Encounter (Signed)
Message copied by Vicenta Dunning on Wed Aug 12, 2012 12:46 PM ------      Message from: Hamilton Capri P      Created: Wed Aug 12, 2012 10:43 AM       Can you let her know that her INR was subtherapeutic. She needs to increase her coumadin dose since it is not within range from 2-3.  Her INR is 1.33.            I want her to take the following and then come back in 1 week to recheck her INR.            2/26 Wednesday-7.5 mg ( she has 5 mg tabs so this will be 1 1/2 tab)      2/27 Thursday-5 mg      2/28 Friday-7.5 mg      3/1 Saturday-5 mg      3/2 Sunday-7.5 mg      3/3 Monday- 5 mg      3/4 Tuesday- 7.5 mg      3/5 Wednesday-Return to get her INR rechecked.             Basically it will be 7.5 mg every other day until she gets her next INR result. I will put that in as a future order.             Thx,      Tle ------

## 2012-08-27 ENCOUNTER — Telehealth: Payer: Self-pay

## 2012-08-27 ENCOUNTER — Other Ambulatory Visit: Payer: Self-pay

## 2012-08-27 DIAGNOSIS — I82402 Acute embolism and thrombosis of unspecified deep veins of left lower extremity: Secondary | ICD-10-CM

## 2012-08-27 DIAGNOSIS — Z7901 Long term (current) use of anticoagulants: Secondary | ICD-10-CM

## 2012-08-27 NOTE — Telephone Encounter (Signed)
LM on phone that I will call her back. She did not pick up. Patient came in for labs only, my chift was over but wanted to be seen by me. I was unable to accommodate this request. Will try calling her later tonight/ tomorrow.

## 2012-08-27 NOTE — Telephone Encounter (Signed)
Patient notified of previous message. She states she does not have a problem coming in for labs but there is more to it than just that. She is very addiment and rude about speaking to Dr. Caesar Bookman to speak with Dr. Conley Rolls Ph# [phone]

## 2012-08-27 NOTE — Telephone Encounter (Signed)
She is due to come in today for labs only to check her INR, this is not in range, not theraputic. Left message for her to advise.

## 2012-08-27 NOTE — Telephone Encounter (Signed)
PT WOULD LIKE TO SPEAK WITH THE DR REGARDING A LEG CLOG. PLEASE CALL [phone]

## 2012-08-28 ENCOUNTER — Telehealth: Payer: Self-pay | Admitting: Family Medicine

## 2012-08-28 LAB — PROTIME-INR
INR: 3.13 — ABNORMAL HIGH (ref <1.50)
Prothrombin Time: 30.7 s — ABNORMAL HIGH (ref 11.6–15.2)

## 2012-08-28 NOTE — Telephone Encounter (Signed)
Spoke with patient, addressed concerns awaiting INR

## 2012-08-29 ENCOUNTER — Telehealth: Payer: Self-pay | Admitting: Family Medicine

## 2012-08-29 NOTE — Telephone Encounter (Signed)
Called and left message about INR at 3.13. INR is better than before, slightly high. Told her to do the following: She will take 5 mg daily of coumadin from M-F. This weekend she will take 5 mg on Saturday and Sunday, however starting next weekend she will take 7.5 mg on Saturday and Sunday.  Not this weekend, next weekend she will start with the 7.5 mg.  She will then return to office in 2 weeks to get another INR check on March 31.

## 2012-08-31 ENCOUNTER — Telehealth: Payer: Self-pay | Admitting: Radiology

## 2012-08-31 NOTE — Telephone Encounter (Signed)
Thanks patient advised of dosing schedule and follow up.

## 2012-08-31 NOTE — Telephone Encounter (Signed)
Message copied by Caffie Damme on Mon Aug 31, 2012  1:00 PM ------      Message from: Lenell Antu      Created: Sat Aug 29, 2012  9:01 AM       Can you confirm that patient received this message later today . I left message on cell phone.             Called and left message about INR at 3.13. INR is better than before, slightly high. Told her to do the following:      She will take 5 mg daily of coumadin from M-F.      This weekend she will take 5 mg on Saturday and Sunday 3/15 and 3/16, however starting next weekend she will take 7.5 mg on Saturday and Sunday.       Not this weekend, next weekend she will start with the 7.5 mg.       She will then return to office in 2 weeks to get another INR check on March 31.       If she has any questions you can direct all calls to me,  I know her fairly well.        ------

## 2012-09-29 ENCOUNTER — Ambulatory Visit: Payer: Self-pay | Admitting: Family Medicine

## 2012-09-29 VITALS — BP 159/90 | HR 90 | Temp 98.0°F | Resp 18 | Ht 66.0 in | Wt 142.0 lb

## 2012-09-29 DIAGNOSIS — I82409 Acute embolism and thrombosis of unspecified deep veins of unspecified lower extremity: Secondary | ICD-10-CM

## 2012-09-29 DIAGNOSIS — T148XXA Other injury of unspecified body region, initial encounter: Secondary | ICD-10-CM

## 2012-09-29 DIAGNOSIS — M25529 Pain in unspecified elbow: Secondary | ICD-10-CM

## 2012-09-29 DIAGNOSIS — M542 Cervicalgia: Secondary | ICD-10-CM

## 2012-09-29 DIAGNOSIS — M25521 Pain in right elbow: Secondary | ICD-10-CM

## 2012-09-29 MED ORDER — CYCLOBENZAPRINE HCL 5 MG PO TABS: 5.0000 mg | ORAL_TABLET | Freq: Every evening | ORAL | Status: DC | PRN

## 2012-09-29 NOTE — Progress Notes (Signed)
 [facility]:  Office Visit  Chief Complaint:  Chief Complaint  Patient presents with  . Arm Pain    right arm- knot    HPI: Marisa Fuller is a 52 y.o. female who complains of:  Right arm pain, has a knot in upper arm x 1 month. Has heat and  dull, aching, sometimes sharp pain to right upper arm, she may have constant pain, but not sure since she can get distracted if busy.Has a h/o carpal tunnel. She does some computer work.  Denies fevers, chills, worsening SOB/DOE. Seems to be getting worse. Has some numbness and tingling in fingers, intermittently, especially When she uses mouse. She is right handed. She has neck pain as well. Pain is in low area of neck, has constant sharp pain there. Took Ibuprofen in past without relief. No known injury.  She has not done any new exercises or have repetitive motion that may be causing this pain ie excessive lifting , pulling, pushing . She uses a rolling bag when she is at school.    History of ft  DVT. She has similar SOB/DOE. No active signs of bleeding. Taking meds regular.   Past Medical History  Diagnosis Date  . Anemia     sickle cell trait  . Sickle cell anemia     trait  . Allergy   . Clotting disorder   . GERD (gastroesophageal reflux disease)    Past Surgical History  Procedure Laterality Date  . Breast surgery    . Cosmetic surgery    . Tubal ligation     History   Social History  . Marital Status: Single    Spouse Name: N/A    Number of Children: N/A  . Years of Education: N/A   Social History Main Topics  . Smoking status: Former Games developer  . Smokeless tobacco: None  . Alcohol Use: Yes  . Drug Use: No  . Sexually Active: None   Other Topics Concern  . None   Social History Narrative  . None   Family History  Problem Relation Age of Onset  . Seizures Mother   . ALS Mother    No Known Allergies Prior to Admission medications   Medication Sig Start Date End Date Taking? Authorizing  Provider  Ascorbic Acid (VITAMIN C PO) Take 1 tablet by mouth once.   Yes Historical Provider, MD  fluticasone (FLONASE) 50 MCG/ACT nasal spray Place 2 sprays into the nose daily. 07/16/12  Yes  P , DO  ibuprofen (ADVIL,MOTRIN) 200 MG tablet Take 400 mg by mouth every 6 (six) hours as needed. For pain   Yes Historical Provider, MD  Multiple Vitamin (MULTIVITAMIN WITH MINERALS) TABS Take 1 tablet by mouth daily.   Yes Historical Provider, MD  ondansetron (ZOFRAN) 4 MG tablet Take 1 tablet (4 mg total) by mouth every 8 (eight) hours as needed for nausea. 08/10/12  Yes  P , DO  warfarin (COUMADIN) 5 MG tablet Take 1 tablet (5 mg total) by mouth daily. Use as directed 06/23/12  Yes  P , DO  Camphor-Eucalyptus-Menthol (VICKS VAPORUB EX) Apply 1 application topically 2 (two) times daily as needed. For sleep    Historical Provider, MD  cetirizine (ZYRTEC) 10 MG tablet Take 10 mg by mouth daily.    Historical Provider, MD  enoxaparin (LOVENOX) 60 MG/0.6ML injection Inject 0.6 mLs (60 mg total) into the skin every 12 (twelve) hours. 06/23/12    P , DO  HYDROcodone-homatropine (HYCODAN)  5-1.5 MG/5ML syrup Take 5 mLs by mouth every 8 (eight) hours as needed for cough. 06/20/12    P , DO     ROS: The patient denies fevers, chills, night sweats, unintentional weight loss, chest pain, palpitations, wheezing, dyspnea on exertion, nausea, vomiting, abdominal pain, dysuria, hematuria, melena, numbness, weakness, or tingling.   All other systems have been reviewed and were otherwise negative with the exception of those mentioned in the HPI and as above.    PHYSICAL EXAM: Filed Vitals:   09/29/12 2006  BP: 159/90  Pulse: 90  Temp: 98 F (36.7 C)  Resp: 18  SpO2     98% Filed Vitals:   09/29/12 2006  Height: 5\' 6"  (1.676 m)  Weight: 142 lb (64.411 kg)   Body mass index is 22.93 kg/(m^2).  General: Alert, no acute distress at rest, mild distress due to pain with ROM HEENT:   Normocephalic, atraumatic, oropharynx patent.  Cardiovascular:  Regular rate and rhythm, no rubs murmurs or gallops.  No Carotid bruits, radial pulse intact. No pedal edema.  Respiratory: Clear to auscultation bilaterally.  No wheezes, rales, or rhonchi.  No cyanosis, no use of accessory musculature GI: No organomegaly, abdomen is soft and non-tender, positive bowel sounds.  No masses. Skin: No rashes. Neurologic: Facial musculature symmetric. Psychiatric: Patient is appropriate throughout our interaction. Lymphatic: No cervical lymphadenopathy Musculoskeletal: Gait intact. Neck-no deformities, neg spurling, full ROM, sensation intact, 5/5 strength, tender lower right trapezius and SCM on rotation Right shoulder-no deformities, no crepitus. full ROM, sensation intact, 5/5 strength, tender subscap on palpation, Neg Hawkins, Neg Empty can, Neg Speeds. +/-  lift off, "can't do it since too painful". She has pain at [facility] joint during adduction.  Right arm-No warmthness/erythema. Slight knot at junction of head of lateral triceps brachii and deltoid junction, tender, patient has very good musculature, similar to left arm. Full ROM, sensation intact, 5/5 strength  LABS: Results for orders placed in visit on 08/27/12  PROTIME-INR      Result Value Range   Prothrombin Time 30.7 (*) 11.6 - 15.2 seconds   INR 3.13 (*) <1.50     EKG/XRAY:   Primary read interpreted by Dr. Conley Rolls at [facility].   ASSESSMENT/PLAN: Encounter Diagnoses  Name Primary?  . Neck pain   . Pain in joint, upper arm, right Yes  . DVT (deep venous thrombosis), unspecified laterality   . Sprain and strain    [name] is a very vibrant, somewhat cantankerous right handed 52 y/o AA female who is here today for recheck of her INR since she has been on chronic anticoagulation for left  DVT. She also has a new complaint of Right upper arm pain. She does not have insurance so we have been judicious per the patients understanding about  labs.   Rx Flexeril Declined pain meds ie tramadol Patient can't take NSAIDs since she is on coumadin.  INR pending. C/w current dose. She has no active bleeding. She has had compliance issues in the past with taking meds and f/u. She states she is taking her coumadin regular. We had changed her dose to Sat and Sundays 7.5 mg daily and M-Fri 5 mg daily. LAst INR 3.13 I had advised her that if she eats greens, her diet needs to stay the same. I don;t care if she eats leafy green vegetable as long as it is consistent.  F/u prn or go to ER prn, patient declined going to ER for further eval at this  time although she is worried about clot in arm. My clinical suspicion is low for that at this time. .  Patient declined to have xrays taken in office, will do it at ortho Refer to [facility] ortho since that was where she wanted to go and she wanted to be referred out for specialty care.     Hamilton Capri PHUONG, DO 09/29/2012 8:59 PM

## 2012-09-30 ENCOUNTER — Telehealth: Payer: Self-pay | Admitting: Family Medicine

## 2012-09-30 ENCOUNTER — Telehealth: Payer: Self-pay

## 2012-09-30 LAB — PROTIME-INR
INR: 1.84 — ABNORMAL HIGH (ref <1.50)
Prothrombin Time: 20.7 seconds — ABNORMAL HIGH (ref 11.6–15.2)

## 2012-09-30 NOTE — Telephone Encounter (Signed)
Pt notified of labs. Wants to know if you still think that her arm pain is an orthopedic problem vs a blood clot problem. Please advise. Thanks     

## 2012-09-30 NOTE — Telephone Encounter (Signed)
Message copied by Johnnette Litter on Wed Sep 30, 2012  7:11 PM ------      Message from: LE, [place] P      Created: Wed Sep 30, 2012  6:55 PM       Can you call her to let her know that her INR is subtherapeutic at 1.84 ( goal is to be between 2-3) . She should take 7.5 mg every other day. So for example            Sat-7.5 mg      Sun-5 mg      Monday-7.5 mg      Tu-5 mg      Wed 7.mg       So on and so forth      Followuo in 2 weeks.             Thanks,      Tle  ------

## 2012-09-30 NOTE — Telephone Encounter (Signed)
Message copied by Johnnette Litter on Wed Sep 30, 2012  7:10 PM ------      Message from: Hamilton Capri P      Created: Wed Sep 30, 2012  6:55 PM       Can you call her to let her know that her INR is subtherapeutic at 1.84 ( goal is to be between 2-3) . She should take 7.5 mg every other day. So for example            Sat-7.5 mg      Sun-5 mg      Monday-7.5 mg      Tu-5 mg      Wed 7.mg       So on and so forth      Followuo in 2 weeks.             Thanks,      Tle  ------

## 2012-09-30 NOTE — Telephone Encounter (Signed)
Called but did not get a hold of. Will attempt to call again to give verbal instructions.

## 2012-09-30 NOTE — Telephone Encounter (Signed)
Pt notified of labs. Wants to know if you still think that her arm pain is an orthopedic problem vs a blood clot problem. Please advise. Thanks

## 2012-10-02 NOTE — Telephone Encounter (Signed)
-----   Message from Lenell Antu, DO sent at 10/01/2012 6:08 PM ----- Please let her know that I still think it is musculoskeltal in orign, if she was having warmth, pain, and swelling then it would be more c/w a clot in her arms. If she wants to get xrays or doppler to give her piece of mind then I will be happy to do it. If she wants to talk to me herself then just let me know. Called her, left message for her to call me back so I can advise.

## 2012-10-02 NOTE — Telephone Encounter (Signed)
Spoke to her and she states it is not significantly swollen. She is advised to come in for xrays if she would like. She does not want to come in for xrays. She is asking about referral to ortho, advised her this is pending and was sent to [facility] [place] ortho, they are closed today. She should hear from the referral soon.

## 2013-04-18 ENCOUNTER — Encounter (HOSPITAL_COMMUNITY): Payer: Self-pay | Admitting: Emergency Medicine

## 2013-04-18 ENCOUNTER — Emergency Department (HOSPITAL_COMMUNITY)
Admission: EM | Admit: 2013-04-18 | Discharge: 2013-04-18 | Disposition: A | Discharge status: Home / Self Care | Payer: Self-pay | Attending: Emergency Medicine | Admitting: Emergency Medicine

## 2013-04-18 ENCOUNTER — Emergency Department (HOSPITAL_COMMUNITY): Payer: Self-pay

## 2013-04-18 DIAGNOSIS — R1013 Epigastric pain: Secondary | ICD-10-CM | POA: Insufficient documentation

## 2013-04-18 DIAGNOSIS — M79609 Pain in unspecified limb: Secondary | ICD-10-CM

## 2013-04-18 DIAGNOSIS — D689 Coagulation defect, unspecified: Secondary | ICD-10-CM | POA: Insufficient documentation

## 2013-04-18 DIAGNOSIS — R0602 Shortness of breath: Secondary | ICD-10-CM | POA: Insufficient documentation

## 2013-04-18 DIAGNOSIS — D571 Sickle-cell disease without crisis: Secondary | ICD-10-CM | POA: Insufficient documentation

## 2013-04-18 DIAGNOSIS — R5381 Other malaise: Secondary | ICD-10-CM | POA: Insufficient documentation

## 2013-04-18 DIAGNOSIS — Z87891 Personal history of nicotine dependence: Secondary | ICD-10-CM | POA: Insufficient documentation

## 2013-04-18 DIAGNOSIS — Z3202 Encounter for pregnancy test, result negative: Secondary | ICD-10-CM | POA: Insufficient documentation

## 2013-04-18 LAB — HEPATIC FUNCTION PANEL
ALT: 10 U/L (ref 0–35)
AST: 17 U/L (ref 0–37)
Albumin: 3.9 g/dL (ref 3.5–5.2)
Alkaline Phosphatase: 122 U/L — ABNORMAL HIGH (ref 39–117)
Total Bilirubin: 0.4 mg/dL (ref 0.3–1.2)
Total Protein: 7.8 g/dL (ref 6.0–8.3)

## 2013-04-18 LAB — CBC WITH DIFFERENTIAL/PLATELET
Basophils Absolute: 0 10*3/uL (ref 0.0–0.1)
Basophils Relative: 1 % (ref 0–1)
Eosinophils Relative: 3 % (ref 0–5)
HCT: 34.9 % — ABNORMAL LOW (ref 36.0–46.0)
Lymphocytes Relative: 35 % (ref 12–46)
Lymphs Abs: 1.8 10*3/uL (ref 0.7–4.0)
MCV: 91.1 fL (ref 78.0–100.0)
Monocytes Absolute: 0.3 10*3/uL (ref 0.1–1.0)
Neutro Abs: 2.8 10*3/uL (ref 1.7–7.7)
Platelets: 348 10*3/uL (ref 150–400)
RBC: 3.83 MIL/uL — ABNORMAL LOW (ref 3.87–5.11)
WBC: 5.1 10*3/uL (ref 4.0–10.5)

## 2013-04-18 LAB — POCT I-STAT TROPONIN I: Troponin i, poc: 0 ng/mL (ref 0.00–0.08)

## 2013-04-18 LAB — BASIC METABOLIC PANEL
CO2: 25 mEq/L (ref 19–32)
Calcium: 9.6 mg/dL (ref 8.4–10.5)
Chloride: 102 mEq/L (ref 96–112)
Creatinine, Ser: 0.91 mg/dL (ref 0.50–1.10)
GFR calc Af Amer: 82 mL/min — ABNORMAL LOW (ref >90)
GFR calc non Af Amer: 71 mL/min — ABNORMAL LOW (ref >90)
Glucose, Bld: 81 mg/dL (ref 70–99)
Potassium: 3.7 mEq/L (ref 3.5–5.1)
Sodium: 136 mEq/L (ref 135–145)

## 2013-04-18 LAB — POCT PREGNANCY, URINE: Preg Test, Ur: NEGATIVE

## 2013-04-18 MED ORDER — IOHEXOL 350 MG/ML SOLN
100.0000 mL | Freq: Once | INTRAVENOUS | Status: AC | PRN
  Administered 2013-04-18: 100 mL via INTRAVENOUS

## 2013-04-18 NOTE — ED Notes (Signed)
Patient transported to X-ray 

## 2013-04-18 NOTE — ED Notes (Addendum)
Pt ambulated without difficulty.O2 100% and Pulse 87. No SOB noted

## 2013-04-18 NOTE — ED Provider Notes (Addendum)
CSN: [id number]     Arrival date & time 04/18/13  1205 History   First MD Initiated Contact with Patient 04/18/13 1237     Chief Complaint  Patient presents with  . Shortness of Breath   (Consider location/radiation/quality/duration/timing/severity/associated sxs/prior Treatment) HPI Comments: 52 year old female presents to the ER for evaluation of shortness of breath has been going on for about 3 months. She states that this is been getting slowly worse. It mostly occurs with exertion. She is now down to the point where she is unable to walk further than 5 or 6 steps when going up stairs. After that she is getting short of breath. She denies any cough or hemoptysis. She denies any chest pain. She's had a lumen of epigastric pain this comes and goes. Denies any blood in her stools or diarrhea. She's also feels like her arms and legs are heavy. This is worse when she is walking, her left arm seems to hurt. She is having bilateral lower extremity pain as well. He feels like the time for she had blood clots in her legs. She used to be on treatment, Coumadin, but has since been taken off. She denies any weight changes.   Past Medical History  Diagnosis Date  . Anemia     sickle cell trait  . Sickle cell anemia     trait  . Allergy   . Clotting disorder   . GERD (gastroesophageal reflux disease)    Past Surgical History  Procedure Laterality Date  . Breast surgery    . Cosmetic surgery    . Tubal ligation     Family History  Problem Relation Age of Onset  . Seizures Mother   . ALS Mother    History  Substance Use Topics  . Smoking status: Former Games developer  . Smokeless tobacco: Not on file  . Alcohol Use: Yes   OB History   Grav Para Term Preterm Abortions TAB SAB Ect Mult Living                 Review of Systems  Constitutional: Positive for fatigue. Negative for fever and chills.  Respiratory: Positive for shortness of breath. Negative for cough.   Cardiovascular: Negative for  chest pain and leg swelling.  Gastrointestinal: Positive for abdominal pain. Negative for vomiting, diarrhea and blood in stool.  Genitourinary: Negative for dysuria.  Neurological: Negative for weakness.  All other systems reviewed and are negative.    Allergies  Review of patient's allergies indicates no known allergies.  Home Medications   Current Outpatient Rx  Name  Route  Sig  Dispense  Refill  . aspirin 325 MG tablet   Oral   Take 325 mg by mouth daily.         . cholecalciferol (VITAMIN D) 1000 UNITS tablet   Oral   Take 1,000 Units by mouth daily.         . Multiple Vitamin (MULTIVITAMIN WITH MINERALS) TABS   Oral   Take 1 tablet by mouth daily.          BP 181/83  Pulse 74  Temp(Src) 97.8 F (36.6 C) (Oral)  Resp 20  SpO2 100% Physical Exam  Nursing note and vitals reviewed. Constitutional: She is oriented to person, place, and time. She appears well-developed and well-nourished.  HENT:  Head: Normocephalic and atraumatic.  Right Ear: External ear normal.  Left Ear: External ear normal.  Nose: Nose normal.  Eyes: Right eye exhibits no discharge. Left eye  exhibits no discharge.  Cardiovascular: Normal rate, regular rhythm and normal heart sounds.   Pulmonary/Chest: Effort normal and breath sounds normal. She has no wheezes. She has no rales.  Abdominal: Soft. There is no tenderness.  Musculoskeletal: She exhibits no edema.  Neurological: She is alert and oriented to person, place, and time.  Skin: Skin is warm and dry.    ED Course  Procedures (including critical care time) Labs Review Labs Reviewed  CBC WITH DIFFERENTIAL - Abnormal; Notable for the following:    RBC 3.83 (*)    HCT 34.9 (*)    All other components within normal limits  BASIC METABOLIC PANEL - Abnormal; Notable for the following:    GFR calc non Af Amer 71 (*)    GFR calc Af Amer 82 (*)    All other components within normal limits  HEPATIC FUNCTION PANEL - Abnormal;  Notable for the following:    Alkaline Phosphatase 122 (*)    All other components within normal limits  LIPASE, BLOOD  POCT I-STAT TROPONIN I  POCT PREGNANCY, URINE   Imaging Review Dg Chest 2 View  04/18/2013   CLINICAL DATA:  Shortness of breath.  Hypertension.  EXAM: CHEST  2 VIEW  COMPARISON:  None.  FINDINGS: The heart size and mediastinal contours are within normal limits. Both lungs are clear. The visualized skeletal structures are unremarkable.  IMPRESSION: No active cardiopulmonary disease.   Electronically Signed   By: Myles Rosenthal M.D.   On: 04/18/2013 13:48   Ct Angio Chest Pe W/cm &/or Wo Cm  04/18/2013   CLINICAL DATA:  Structures show no acute abnormality. Shortness of breath  EXAM: CT ANGIOGRAPHY CHEST WITH CONTRAST  TECHNIQUE: Multidetector CT imaging of the chest was performed using the standard protocol during bolus administration of intravenous contrast. Multiplanar CT image reconstructions including MIPs were obtained to evaluate the vascular anatomy.  CONTRAST:  OMNIPAQUE IOHEXOL 350 MG/ML SOLN  COMPARISON:  None.  FINDINGS: The lungs are well aerated bilaterally without focal infiltrate or sizable effusion. The hilar and mediastinal structures show no evidence of significant lymphadenopathy. The cardiac structures are within normal limits. No significant coronary calcifications are noted. Limited evaluation of the aorta reveals no aneurysmal dilatation or dissection. The pulmonary artery is within normal limits and shows no pulmonary emboli.  Bilateral breast implants are seen. The visualized upper abdominal structures are within normal limits. The bony  Review of the MIP images confirms the above findings.  IMPRESSION: No evidence of pulmonary emboli. No acute abnormality is seen.   Electronically Signed   By: Alcide Clever M.D.   On: 04/18/2013 14:54    EKG Interpretation     Ventricular Rate:  59 PR Interval:  130 QRS Duration: 69 QT Interval:  425 QTC  Calculation: 421 R Axis:   71 Text Interpretation:  Sinus rhythm Baseline wander in lead(s) V4 V5 Normal ST-T segments No significant change since last tracing            MDM   1. Shortness of breath    No cause found for patient's chronic sx. Given her prior hx of DVT a CT was obtained but no PE, PNA or edema is seen. EKG benign. Walked in ED w/o dyspnea or hypoxia. States she only gets short of breath with stairs. She is well appearing here. She states the her bilateral calves hurt like they did when she had a DVT, though they've been hurting since she was originally diagnosed. Requesting  DVT U/S. As it is daytime, will get these ultrasounds but my suspicion is low. Given her well appearance, the length of her sx and normal w/u, I feel she can acquire a PCP and do further w/u as outpatient (possible stress, PFTs, etc). No indication of acute worsening or need for admission. Care transferred to Dr. Loretha Stapler with u/s pending    Audree Camel, MD 04/18/13 1659  Audree Camel, MD 04/18/13 1700

## 2013-04-18 NOTE — ED Notes (Signed)
Pt c/o SOB has gotten worse in last few months more with exertion and heaviness in legs and arms. Pt states that everything has gradually gotten worse over the past couple of months.

## 2013-04-18 NOTE — Progress Notes (Signed)
VASCULAR LAB PRELIMINARY  PRELIMINARY  PRELIMINARY  PRELIMINARY  Bilateral lower extremity venous Dopplers completed.    Preliminary report:  There is no DVT or SVT noted in the bilateral lower extremities.  Marisa Fuller, RVT 04/18/2013, 5:54 PM

## 2013-04-22 ENCOUNTER — Other Ambulatory Visit: Payer: Self-pay

## 2014-07-05 IMAGING — CT CT ANGIO CHEST
2 of 7 series · 19 of 36 positions shown · IV contrast (APPLIED)
Comparison: None.

CLINICAL DATA: Chest pain.  History of deep venous thrombosis.

CT ANGIOGRAPHY CHEST
TECHNIQUE: Multidetector CT imaging of the chest using the
standard protocol during bolus administration of intravenous
contrast. Multiplanar reconstructed images including MIPs were
obtained and reviewed to evaluate the vascular anatomy.
Contrast: 100mL OMNIPAQUE IOHEXOL 350 MG/ML SOLN

[Series 7: pulm embolism 1.0 b25f st · axial · 0.63mm/px · z∈[+670,+925]mm · 18 of 285 slices shown]
[im 15/285  lung]
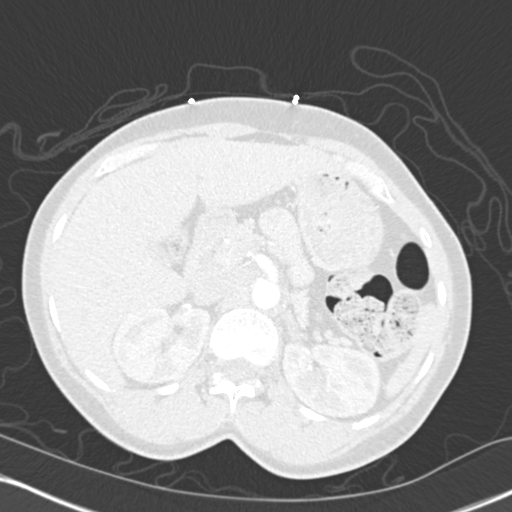
[im 29/285  mediastinal]
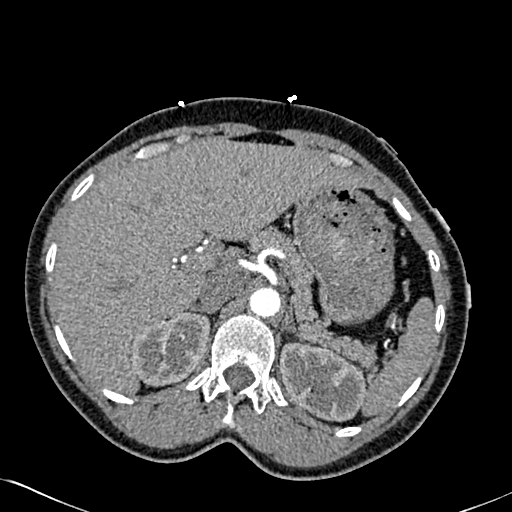
[im 43/285  lung]
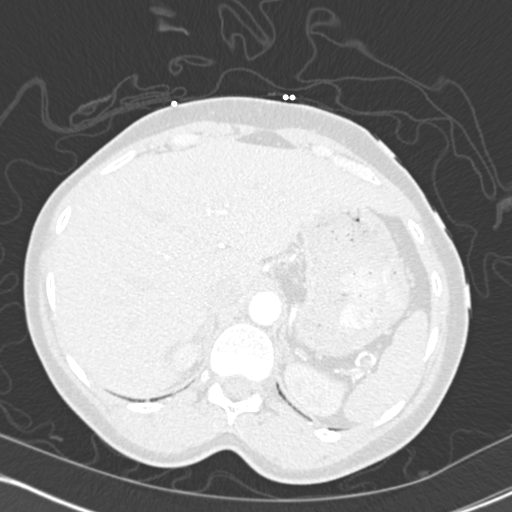
[im 57/285  mediastinal]
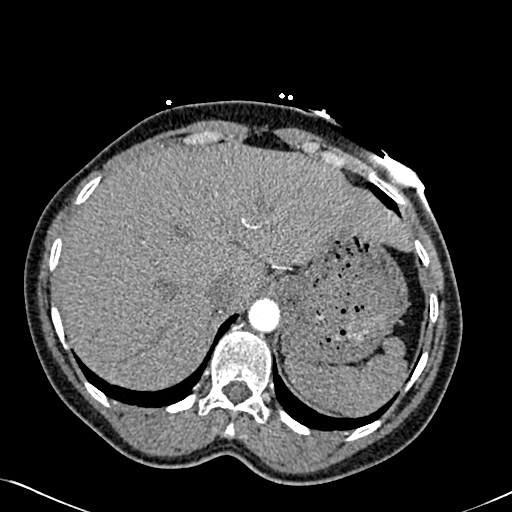
[im 72/285  lung]
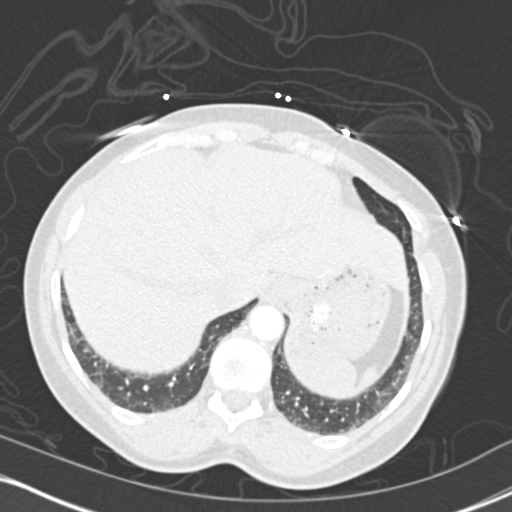
[im 86/285  mediastinal]
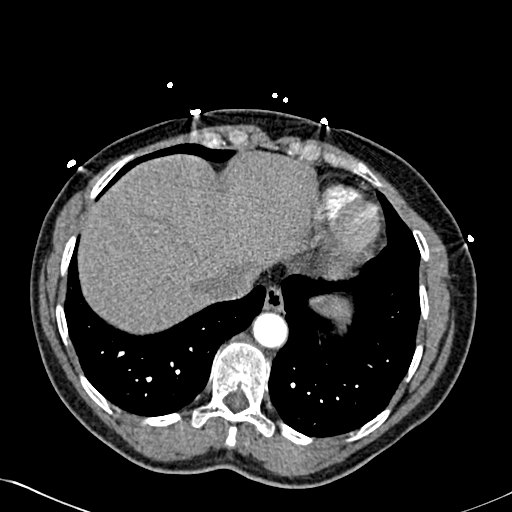
[im 100/285  lung]
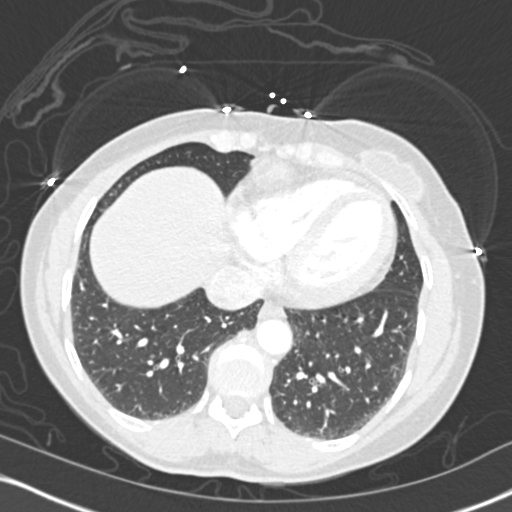
[im 114/285  mediastinal]
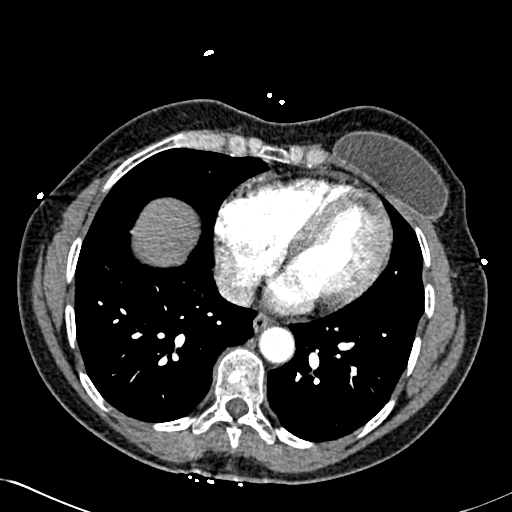
[im 128/285  lung]
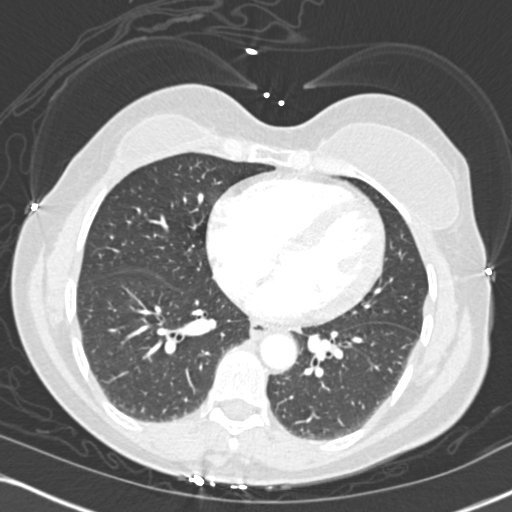
[im 157/285  mediastinal]
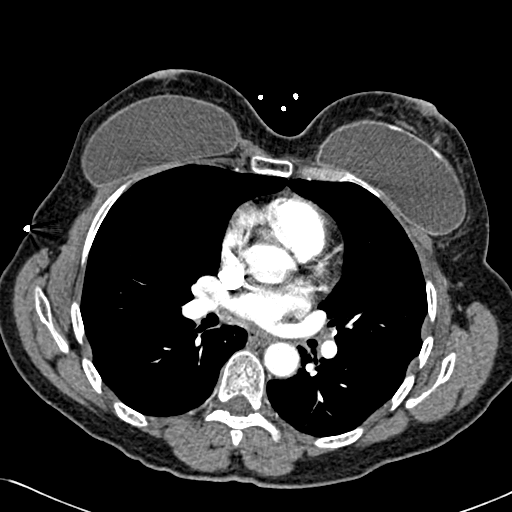
[im 171/285  lung]
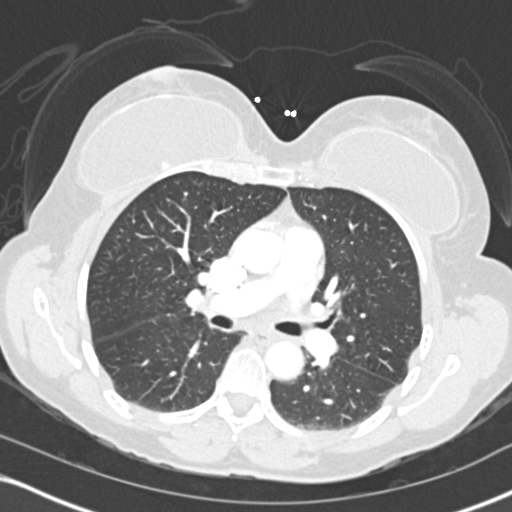
[im 185/285  mediastinal]
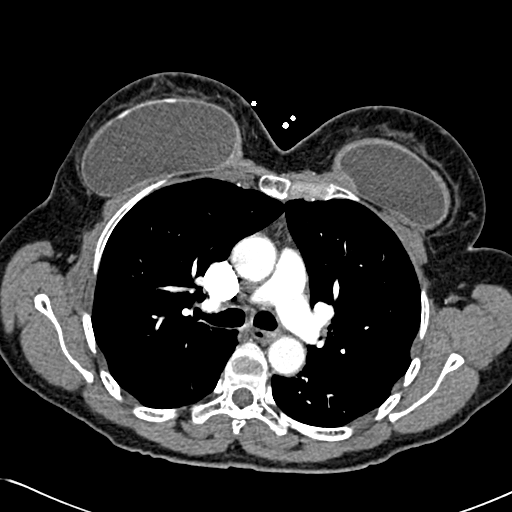
[im 199/285  lung]
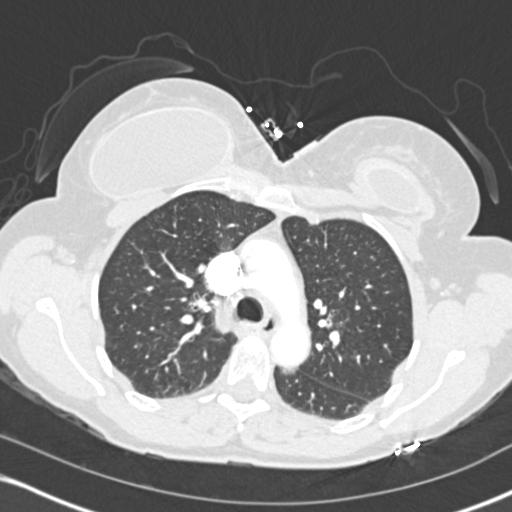
[im 214/285  mediastinal]
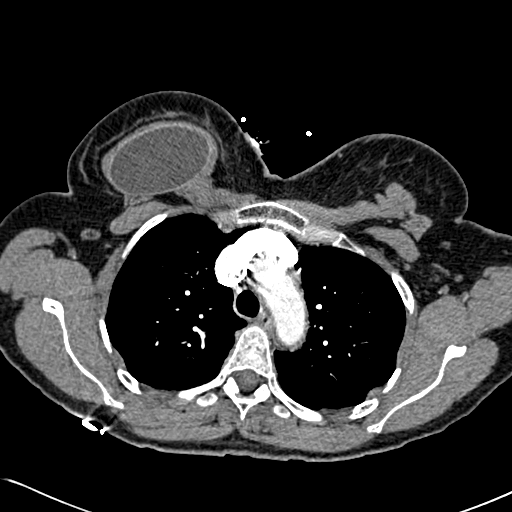
[im 228/285  lung]
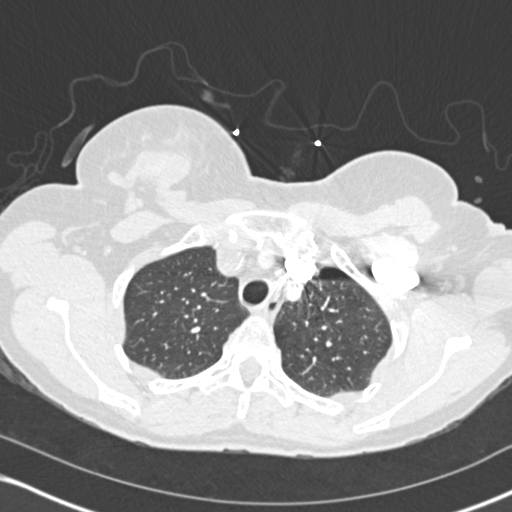
[im 242/285  mediastinal]
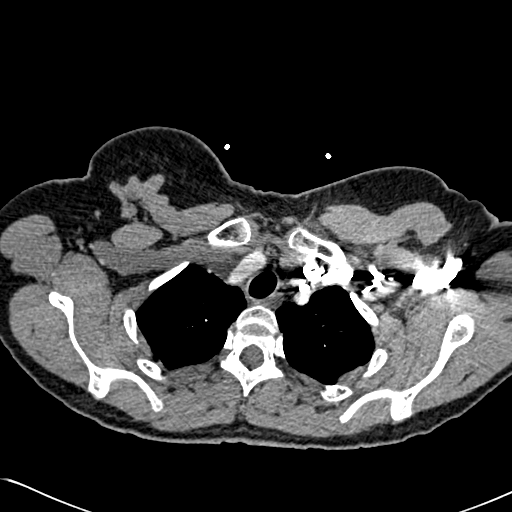
[im 256/285  lung]
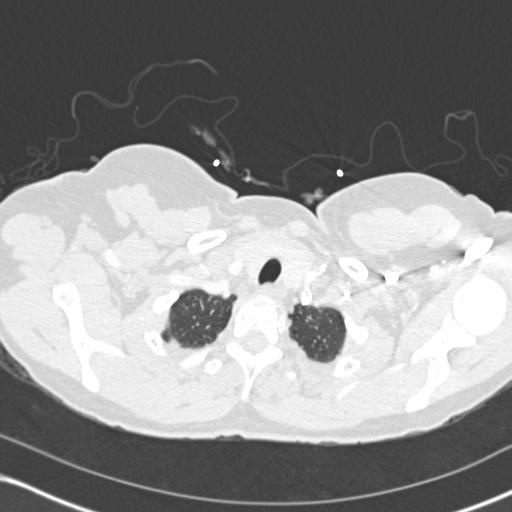
[im 270/285  mediastinal]
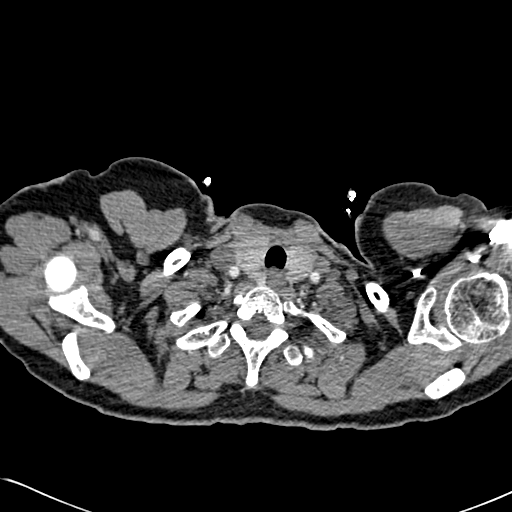

[Series 8: coronals · coronal · 0.61mm/px · 1 of 97 slices shown]
[im 49/97  mediastinal]
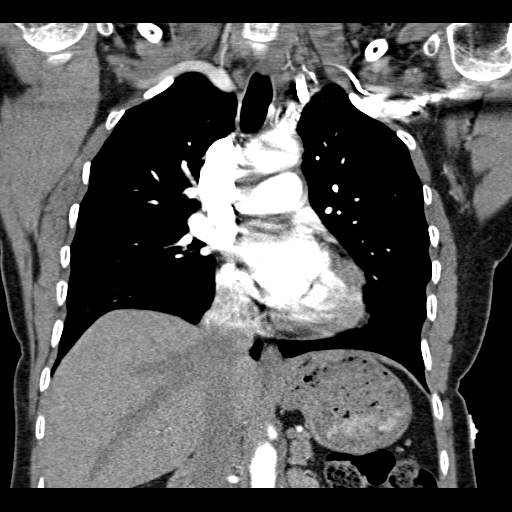

[19 of 36 positions shown; findings below may reference images not displayed]

FINDINGS: No pulmonary embolus is identified.  The study is
technically good.  Heart size is normal.  Breast implants are
noted.  No axillary, hilar or mediastinal lymphadenopathy.  No
pleural or pericardial effusion.  The lungs are clear.
Incidentally imaged upper abdomen is unremarkable.  There is no
focal bony abnormality.
IMPRESSION: Negative for pulmonary embolus.  Negative exam.

## 2019-08-31 ENCOUNTER — Other Ambulatory Visit: Payer: Self-pay

## 2019-08-31 ENCOUNTER — Ambulatory Visit (INDEPENDENT_AMBULATORY_CARE_PROVIDER_SITE_OTHER): Payer: Self-pay | Admitting: Cardiology

## 2019-08-31 ENCOUNTER — Encounter: Payer: Self-pay | Admitting: Cardiology

## 2019-08-31 ENCOUNTER — Telehealth: Payer: Self-pay | Admitting: *Deleted

## 2019-08-31 VITALS — BP 118/66 | HR 76 | Ht 66.0 in | Wt 149.8 lb

## 2019-08-31 DIAGNOSIS — R072 Precordial pain: Secondary | ICD-10-CM

## 2019-08-31 DIAGNOSIS — R000 Tachycardia, unspecified: Secondary | ICD-10-CM

## 2019-08-31 DIAGNOSIS — E7800 Pure hypercholesterolemia, unspecified: Secondary | ICD-10-CM

## 2019-08-31 DIAGNOSIS — Z7189 Other specified counseling: Secondary | ICD-10-CM

## 2019-08-31 DIAGNOSIS — R002 Palpitations: Secondary | ICD-10-CM

## 2019-08-31 DIAGNOSIS — E782 Mixed hyperlipidemia: Secondary | ICD-10-CM

## 2019-08-31 NOTE — Progress Notes (Signed)
Cardiology Office Note:    Date:  08/31/2019   ID:  [name], DOB 1961/06/07, MRN [id number]  PCP:  Sherren Mocha, MD  Cardiologist:  Jodelle Red, MD  Referring MD: Willaim Rayas, NP   CC: new patient evaluation for palpitations and chest discomfort  History of Present Illness:    Marisa Fuller is a 59 y.o. female without prior cardiac medical issues who is seen as a new consult at the request of Willaim Rayas, NP for the evaluation and management of palpitations and chest discomfort.  Notes from [facility] reviewed today. Note from Charma Igo, NP dated 08-13-19. There is an added comment dated 08-21-19 for a 1 day history of heart racing/chest heaviness. The remainder of the handwritten comments are cut off from the faxed page.  Labs from the clinic are notable for normal renal function test, normal liver function test. Lipids are very abnormal. Lipids dated 08/13/19 show Tchol 313, HDL 42, LDL 232, VLDL 38, TG 192, ratio 7.3. TSH normal. CBC normal except for elevated platelets at 458k.  Today: Usually very energetic at baseline, but has felt very fatigued for the last several months.   Heart racing scares her, she feels like she can't "get a grip." Has noticed just in the last few weeks. Happens when she gets ready to be active. Now happening daily with movement. Lasts only seconds. No syncope but one episode of near syncope. Started to "see stars."  Notes that she has chest pain when she lays on her right side. Feels like a sharp jab in the center of her chest, goes away when she turns to the other side. Was having rare episodes of chest heaviness, like something sitting on her chest. This has gotten less intense, much less heavy than it was but still frequent. Notes that happens at rest or when she is walking. She isn't sure how long it lasts but feels like she is walking with the pressure all the time. She is short of breath, lightheaded, but no  nausea with these events.  All of this stemmed after she was diagnosed with Covid. Did not need to be hospitalized with this.  Remote history of DVT in the leg after an injury.  Family history: grandfather had a heart attack. Mother is alive, only has epilepsy due to a head injury. Knows nothing of her father's side. Sister had a different father.     Past Medical History:  Diagnosis Date  . Allergy   . Anemia    sickle cell trait  . Clotting disorder (HCC)   . GERD (gastroesophageal reflux disease)   . Sickle cell anemia (HCC)    trait    Past Surgical History:  Procedure Laterality Date  . BREAST SURGERY    . COSMETIC SURGERY    . TUBAL LIGATION      Current Medications: Current Outpatient Medications on File Prior to Visit  Medication Sig  . cholecalciferol (VITAMIN D) 1000 UNITS tablet Take 1,000 Units by mouth daily.  [name] lisinopril (ZESTRIL) 20 MG tablet Take 20 mg by mouth daily.  . Multiple Vitamin (MULTIVITAMIN WITH MINERALS) TABS Take 1 tablet by mouth daily.   No current facility-administered medications on file prior to visit.     Allergies:   Patient has no known allergies.   Social History   Tobacco Use  . Smoking status: Former Games developer  . Smokeless tobacco: Never Used  Substance Use Topics  . Alcohol use: Yes  .  Drug use: No    Family History: family history includes ALS in her mother; Cancer in her sister; HIV/AIDS in her sister; Seizures in her mother.  ROS:   Please see the history of present illness.  Additional pertinent ROS: Constitutional: Negative for chills, fever, night sweats, unintentional weight loss  HENT: Negative for ear pain and hearing loss.   Eyes: Negative for loss of vision and eye pain.  Respiratory: Negative for cough, sputum, wheezing.   Cardiovascular: See HPI. Gastrointestinal: Negative for abdominal pain, melena, and hematochezia.  Genitourinary: Negative for dysuria and hematuria.  Musculoskeletal: Negative for  falls and myalgias.  Skin: Negative for itching and rash.  Neurological: Negative for focal weakness, focal sensory changes and loss of consciousness.  Endo/Heme/Allergies: Does not bruise/bleed easily.     EKGs/Labs/Other Studies Reviewed:    The following studies were reviewed today: No prior cardiac studies  EKG:  EKG is personally reviewed.  The ekg ordered today demonstrates NSR  Recent Labs: No results found for requested labs within last 8760 hours.  Recent Lipid Panel No results found for: CHOL, TRIG, HDL, CHOLHDL, VLDL, LDLCALC, LDLDIRECT  Physical Exam:    VS:  BP 118/66   Pulse 76   Ht 5\' 6"  (1.676 m)   Wt 149 lb 12.8 oz (67.9 kg)   BMI 24.18 kg/m     Wt Readings from Last 3 Encounters:  08/31/19 149 lb 12.8 oz (67.9 kg)  09/29/12 142 lb (64.4 kg)  08/10/12 130 lb (59 kg)    GEN: Well nourished, well developed in no acute distress HEENT: Normal, moist mucous membranes NECK: No JVD CARDIAC: regular rhythm, normal S1 and S2, no rubs or gallops. No murmurs. VASCULAR: Radial and DP pulses 2+ bilaterally. No carotid bruits RESPIRATORY:  Clear to auscultation without rales, wheezing or rhonchi  ABDOMEN: Soft, non-tender, non-distended MUSCULOSKELETAL:  Ambulates independently SKIN: Warm and dry, no edema NEUROLOGIC:  Alert and oriented x 3. No focal neuro deficits noted. PSYCHIATRIC:  Normal affect    ASSESSMENT:    1. Precordial pain   2. Palpitation   3. Tachycardia   4. Elevated LDL cholesterol level   5. Mixed hyperlipidemia   6. Cardiac risk counseling   7. Counseling on health promotion and disease prevention    PLAN:    Chest pressure: --discussed treadmill stress, nuclear stress/lexiscan, and CT coronary angiography. Discussed pros and cons of each, including but not limited to false positive/false negative risk, radiation risk, and risk of IV contrast dye. Based on shared decision making, decision was made to pursue CT coronary  angiography. 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 Limit salt. -recommend moderate walking, 3-5 times/week for 30-50 minutes each session. Aim for at least 150 minutes.week. Goal should be pace of 3 miles/hours, or walking 1.5 miles in 30 minutes -recommend avoidance of tobacco products. Avoid excess alcohol. -ASCVD risk score: unable to calculate due to LDL >190  Plan for follow up: 2  mos or sooner to discuss results of testing  Buford Dresser, MD, PhD [place]  [facility]    Medication Adjustments/Labs and Tests Ordered: Current medicines are reviewed at length with the patient today.  Concerns regarding medicines are outlined above.  Orders Placed This Encounter  Procedures  . CT CORONARY MORPH W/CTA COR W/SCORE W/CA W/CM &/OR WO/CM  . CT CORONARY FRACTIONAL FLOW RESERVE DATA PREP  . CT CORONARY FRACTIONAL FLOW RESERVE FLUID ANALYSIS  . Lipid panel  . LONG TERM MONITOR (3-14 DAYS)  . EKG 12-Lead   No orders of the defined types were placed in this encounter.   Patient Instructions  Medication Instructions:  Your Physician recommend you continue on your current medication as directed.    *If you need a refill on your cardiac medications before your next appointment, please call your pharmacy*   Lab Work: Your physician recommends that you return for lab  work today (Lipid)  If you have labs (blood work) drawn today and your tests are completely normal, you will receive your results only by: [name] MyChart Message (if you have MyChart) OR . A paper copy in the mail If you have any lab test that is abnormal or we need to change your treatment, we will call you to review the results.   Testing/Procedures: Our physician has recommended that you wear an 7  DAY ZIO-PATCH monitor. The Zio patch cardiac monitor continuously records heart rhythm data for up to 14 days, this is for patients being evaluated for multiple types heart rhythms. For the first 24 hours post application, please avoid getting the Zio monitor wet in the shower or by excessive sweating during exercise. After that, feel free to carry on with regular activities. Keep soaps and lotions away from the ZIO XT Patch.   Someone will call to mail monitor   CT Angiography (CTA), is a special type of CT scan that uses a computer to produce multi-dimensional views of major blood vessels throughout the body. In CT angiography, a contrast material is injected through an IV to help visualize the blood vessels [facility]     Follow-Up: At [facility], you and your health needs are our priority.  As part of our continuing mission to provide you with exceptional heart care, we have created designated Provider Care Teams.  These Care Teams include your primary Cardiologist (physician) and Advanced Practice Providers (APPs -  Physician Assistants and Nurse Practitioners) who all work together to provide you with the care you need, when you need it.  We recommend signing up for the patient portal called "MyChart".  Sign up information is provided on this After Visit Summary.  MyChart is used to connect with patients for Virtual Visits (Telemedicine).  Patients are able to view lab/test results, encounter notes, upcoming appointments, etc.  Non-urgent messages can be sent to your provider as well.    To learn more about what you can do with MyChart, go to [email or web address].    Your next appointment:   2 month(s)  The format for your next appointment:   In Person  Provider:   Buford Dresser, MD  Your cardiac CT will be scheduled at one of the below locations:   [facility] [address] [place], [place] [address] [phone]  [facility] [address] [place], [place] [address] [phone]  If scheduled at [facility], please arrive at the [facility] main entrance  of [facility] 30 minutes prior to test start time. Proceed to the [facility] Radiology Department (first floor) to check-in and test prep.  If scheduled at [facility], please arrive 15 mins early for check-in and test prep.  Please follow these instructions carefully (unless otherwise directed):  On the Night Before the Test: . Be sure to Drink plenty of water. . Do not consume any caffeinated/decaffeinated beverages or chocolate 12 hours prior to your test. . Do not take any antihistamines 12 hours prior to your test.  On the Day of the Test: . Drink plenty of water. Do not drink any water within one hour of the test. . Do not eat any food 4 hours prior to the test. . You may take your regular medications prior to the test.  . Take metoprolol 50 mg (Lopressor) two hours prior to test. . FEMALES- please wear underwire-free bra if available       After the Test: . Drink plenty of water. . After receiving IV contrast, you may experience a mild flushed feeling. This is normal. . On occasion, you may experience a mild rash up to 24 hours after the test. This is not dangerous. If this occurs, you can take Benadryl 25 mg and increase your fluid intake. . If you experience trouble breathing, this can be serious. If it is severe call 911 IMMEDIATELY. If it is mild, please call our  office. . If you take any of these medications: Glipizide/Metformin, Avandament, Glucavance, please do not take 48 hours after completing test unless otherwise instructed.   Once we have confirmed authorization from your insurance company, we will call you to set up a date and time for your test.   For non-scheduling related questions, please contact the cardiac imaging nurse navigator should you have any questions/concerns: Rockwell Alexandria, RN Navigator Cardiac Imaging [facility] [phone] office  For scheduling needs, including cancellations and rescheduling, please call [phone].   ZIO XT- Long Term Monitor Instructions   Your physician has requested you wear your ZIO patch monitor___7____days.   This is a single patch monitor.  [employer] supplies one patch monitor per enrollment.  Additional stickers are not available.   Please do not apply patch if you will be having a Nuclear Stress Test, Echocardiogram, Cardiac CT, MRI, or Chest Xray during the time frame you would be wearing the monitor. The patch cannot be worn during these tests.  You cannot remove and re-apply the ZIO XT patch monitor.   Your ZIO patch monitor will be sent USPS Priority mail from [facility] directly to your home address. The monitor may also be mailed to a PO BOX if home delivery is not available.   It may take 3-5 days to receive your monitor after you have been enrolled.   Once you have received you monitor, please review enclosed instructions.  Your monitor has already been registered assigning a specific monitor serial # to you.   Applying the monitor   Shave hair from upper left chest.   Hold abrader disc by orange tab.  Rub abrader in 40 strokes over left upper chest as indicated in your monitor instructions.   Clean area with 4 enclosed alcohol pads .  Use all pads to assure are is cleaned thoroughly.  Let dry.   Apply patch as indicated in monitor  instructions.  Patch will be place under collarbone on left side of chest with arrow pointing upward.  Rub patch adhesive wings for 2 minutes.Remove white label marked "1".  Remove white label marked "2".  Rub patch adhesive wings for 2 additional minutes.   While looking in a mirror, press and release button in center of patch.  A small green light will flash 3-4 times .  This will be your only indicator the monitor has been turned on.     Do not shower for the first 24 hours.  You may shower after the first 24 hours.   Press button if you feel a symptom. You will hear a small click.  Record Date, Time and Symptom in the Patient Log Book.   When you are ready to remove patch, follow instructions on last 2 pages of Patient Log Book.  Stick patch monitor onto last page of Patient Log Book.   Place Patient Log Book in [place] box.  Use locking tab on box and tape box closed securely.  The Orange and [employer] has [employer] on it.  Please place in mailbox as soon as possible.  Your physician should have your test results approximately 7 days after the monitor has been mailed back to [facility].   Call [facility] Customer Care at [phone] if you have questions regarding your ZIO XT patch monitor.  Call them immediately if you see an orange light blinking on your monitor.   If your monitor falls off in less than 4 days contact our Monitor department at [phone].  If your monitor becomes loose or falls off after 4 days call [employer] at [phone] for suggestions on securing your monitor.        Signed, Jodelle Red, MD PhD 08/31/2019     [facility]

## 2019-08-31 NOTE — Telephone Encounter (Signed)
Patient enrolled for [employer] to mail a 7 day ZIO XT long term holter monitor to her home.  Instructions reviewed briefly as they are included in the monitor kit.  Patient given financial assistance program information.

## 2019-08-31 NOTE — Patient Instructions (Signed)
Medication Instructions:  Your Physician recommend you continue on your current medication as directed.    *If you need a refill on your cardiac medications before your next appointment, please call your pharmacy*   Lab Work: Your physician recommends that you return for lab work today (Lipid)  If you have labs (blood work) drawn today and your tests are completely normal, you will receive your results only by: [name] MyChart Message (if you have MyChart) OR . A paper copy in the mail If you have any lab test that is abnormal or we need to change your treatment, we will call you to review the results.   Testing/Procedures: Our physician has recommended that you wear an 7  DAY ZIO-PATCH monitor. The Zio patch cardiac monitor continuously records heart rhythm data for up to 14 days, this is for patients being evaluated for multiple types heart rhythms. For the first 24 hours post application, please avoid getting the Zio monitor wet in the shower or by excessive sweating during exercise. After that, feel free to carry on with regular activities. Keep soaps and lotions away from the ZIO XT Patch.   Someone will call to mail monitor   CT Angiography (CTA), is a special type of CT scan that uses a computer to produce multi-dimensional views of major blood vessels throughout the body. In CT angiography, a contrast material is injected through an IV to help visualize the blood vessels [facility]     Follow-Up: At [facility], you and your health needs are our priority.  As part of our continuing mission to provide you with exceptional heart care, we have created designated Provider Care Teams.  These Care Teams include your primary Cardiologist (physician) and Advanced Practice Providers (APPs -  Physician Assistants and Nurse Practitioners) who all work together to provide you with the care you need, when you need it.  We recommend signing up for the patient portal called "MyChart".   Sign up information is provided on this After Visit Summary.  MyChart is used to connect with patients for Virtual Visits (Telemedicine).  Patients are able to view lab/test results, encounter notes, upcoming appointments, etc.  Non-urgent messages can be sent to your provider as well.   To learn more about what you can do with MyChart, go to [email or web address].    Your next appointment:   2 month(s)  The format for your next appointment:   In Person  Provider:   Jodelle Red, MD  Your cardiac CT will be scheduled at one of the below locations:   [facility] [address] [place], [place] [address] [phone]  OR  [facility] [address] [place], [place] [address] [phone]  If scheduled at [facility], please arrive at the [facility] main entrance of [facility] 30 minutes prior to test start time. Proceed to the [facility] Radiology Department (first floor) to check-in and test prep.  If scheduled at [facility], please arrive 15 mins early for check-in and test prep.  Please follow these instructions carefully (unless otherwise directed):  On the Night Before the Test: . Be sure to Drink plenty of water. . Do not consume any caffeinated/decaffeinated beverages or chocolate 12 hours prior to your test. . Do not take any antihistamines 12 hours prior to your test.  On the Day of the Test: . Drink plenty of water. Do not drink any water within one hour  of the test. . Do not eat any food 4 hours prior to the test. . You may take your regular medications prior to the test.  . Take metoprolol 50 mg (Lopressor) two hours prior to test. . FEMALES- please wear underwire-free bra if available       After the Test: . Drink plenty of water. . After receiving IV contrast, you may experience a mild flushed feeling. This is normal. . On occasion, you may  experience a mild rash up to 24 hours after the test. This is not dangerous. If this occurs, you can take Benadryl 25 mg and increase your fluid intake. . If you experience trouble breathing, this can be serious. If it is severe call 911 IMMEDIATELY. If it is mild, please call our office. . If you take any of these medications: Glipizide/Metformin, Avandament, Glucavance, please do not take 48 hours after completing test unless otherwise instructed.   Once we have confirmed authorization from your insurance company, we will call you to set up a date and time for your test.   For non-scheduling related questions, please contact the cardiac imaging nurse navigator should you have any questions/concerns: Marchia Bond, RN Navigator Cardiac Imaging [facility] [phone] office  For scheduling needs, including cancellations and rescheduling, please call [phone].   ZIO XT- Long Term Monitor Instructions   Your physician has requested you wear your ZIO patch monitor___7____days.   This is a single patch monitor.  Ir[employer] supplies one patch monitor per enrollment.  Additional stickers are not available.   Please do not apply patch if you will be having a Nuclear Stress Test, Echocardiogram, Cardiac CT, MRI, or Chest Xray during the time frame you would be wearing the monitor. The patch cannot be worn during these tests.  You cannot remove and re-apply the ZIO XT patch monitor.   Your ZIO patch monitor will be sent USPS Priority mail from [facility] directly to your home address. The monitor may also be mailed to a PO BOX if home delivery is not available.   It may take 3-5 days to receive your monitor after you have been enrolled.   Once you have received you monitor, please review enclosed instructions.  Your monitor has already been registered assigning a specific monitor serial # to you.   Applying the monitor   Shave hair from upper left chest.    Hold abrader disc by orange tab.  Rub abrader in 40 strokes over left upper chest as indicated in your monitor instructions.   Clean area with 4 enclosed alcohol pads .  Use all pads to assure are is cleaned thoroughly.  Let dry.   Apply patch as indicated in monitor instructions.  Patch will be place under collarbone on left side of chest with arrow pointing upward.   Rub patch adhesive wings for 2 minutes.Remove white label marked "1".  Remove white label marked "2".  Rub patch adhesive wings for 2 additional minutes.   While looking in a mirror, press and release button in center of patch.  A small green light will flash 3-4 times .  This will be your only indicator the monitor has been turned on.     Do not shower for the first 24 hours.  You may shower after the first 24 hours.   Press button if you feel a symptom. You will hear a small click.  Record Date, Time and Symptom in the Patient Log Book.  When you are ready to remove patch, follow instructions on last 2 pages of Patient Log Book.  Stick patch monitor onto last page of Patient Log Book.   Place Patient Log Book in [place] box.  Use locking tab on box and tape box closed securely.  The [employer] and [employer] has [employer] on it.  Please place in mailbox as soon as possible.  Your physician should have your test results approximately 7 days after the monitor has been mailed back to [facility].   Call [facility] Customer Care at [phone] if you have questions regarding your ZIO XT patch monitor.  Call them immediately if you see an orange light blinking on your monitor.   If your monitor falls off in less than 4 days contact our Monitor department at [phone].  If your monitor becomes loose or falls off after 4 days call [employer] at [phone] for suggestions on securing your monitor.

## 2019-09-01 ENCOUNTER — Telehealth: Payer: Self-pay | Admitting: Licensed Clinical Social Worker

## 2019-09-01 LAB — LIPID PANEL
Chol/HDL Ratio: 5.5 ratio — ABNORMAL HIGH (ref 0.0–4.4)
Cholesterol, Total: 291 mg/dL — ABNORMAL HIGH (ref 100–199)
HDL: 53 mg/dL (ref >39)
LDL Chol Calc (NIH): 205 mg/dL — ABNORMAL HIGH (ref 0–99)
Triglycerides: 177 mg/dL — ABNORMAL HIGH (ref 0–149)
VLDL Cholesterol Cal: 33 mg/dL (ref 5–40)

## 2019-09-01 NOTE — Telephone Encounter (Signed)
CSW referred to assist with financial assistance as patient has no insurance. CSW attempted to reach patient although no answer and message left for return call. Lasandra Beech, LCSW, CCSW-MCS [phone]

## 2019-09-06 ENCOUNTER — Ambulatory Visit (INDEPENDENT_AMBULATORY_CARE_PROVIDER_SITE_OTHER): Payer: Self-pay

## 2019-09-06 DIAGNOSIS — R000 Tachycardia, unspecified: Secondary | ICD-10-CM

## 2019-09-06 DIAGNOSIS — R002 Palpitations: Secondary | ICD-10-CM

## 2019-09-14 ENCOUNTER — Telehealth: Payer: Self-pay | Admitting: Cardiology

## 2019-09-14 NOTE — Telephone Encounter (Signed)
Left message for patient to call back if she has any questions. 

## 2019-09-14 NOTE — Telephone Encounter (Signed)
Patient called and said she recently received a call from [place].No other notes were made about an attempted call today.  Please call the patient back

## 2019-09-21 ENCOUNTER — Ambulatory Visit: Payer: Self-pay

## 2019-09-24 ENCOUNTER — Telehealth: Payer: Self-pay | Admitting: Cardiology

## 2019-09-24 NOTE — Telephone Encounter (Signed)
Pt calling in asking for her heart monitor results. She states she turned her monitor in on 4/2 and she was told to wait for her results because of the Easter Holiday so she did and now she is calling back for the results. Notified that they have not yet been resulted by Dr.Christopher but that I would send this message so she would know that the pt was calling for the results. Pt verbalized understanding with no other questions at this time.

## 2019-09-24 NOTE — Telephone Encounter (Signed)
Patient is calling for her heart monitor results.

## 2019-09-30 NOTE — Telephone Encounter (Signed)
Pt updated and verbalized understanding.  

## 2019-10-12 ENCOUNTER — Ambulatory Visit: Payer: Self-pay

## 2019-10-13 ENCOUNTER — Ambulatory Visit (INDEPENDENT_AMBULATORY_CARE_PROVIDER_SITE_OTHER): Payer: Self-pay | Admitting: Cardiology

## 2019-10-13 ENCOUNTER — Other Ambulatory Visit: Payer: Self-pay

## 2019-10-13 ENCOUNTER — Encounter: Payer: Self-pay | Admitting: Cardiology

## 2019-10-13 VITALS — BP 138/60 | HR 89 | Temp 97.3°F | Ht 66.0 in | Wt 145.0 lb

## 2019-10-13 DIAGNOSIS — E7800 Pure hypercholesterolemia, unspecified: Secondary | ICD-10-CM

## 2019-10-13 DIAGNOSIS — Z7189 Other specified counseling: Secondary | ICD-10-CM

## 2019-10-13 DIAGNOSIS — R5382 Chronic fatigue, unspecified: Secondary | ICD-10-CM

## 2019-10-13 DIAGNOSIS — E782 Mixed hyperlipidemia: Secondary | ICD-10-CM

## 2019-10-13 DIAGNOSIS — R002 Palpitations: Secondary | ICD-10-CM

## 2019-10-13 NOTE — Progress Notes (Signed)
Cardiology Office Note:    Date:  10/13/2019   ID:  Marisa Fuller, DOB 01-17-1961, MRN [id number]  PCP:  Marisa Knapp, MD  Cardiologist:  Marisa Dresser, MD  Referring MD: Marisa Knapp, MD   CC: follow up  History of Present Illness:    Marisa Fuller is a 59 y.o. female with a hx of palpitations and chest discomfort who is seen for follow up. I initially met her 08/31/19 as a new consult at the request of Marisa Knapp, MD for the evaluation and management of palpitations and chest discomfort.  Today: Still having fatigue, but make slow progress. Feels her heart pounding still intermittently. No more chest heaviness. Stays very active, has not been specifically limited in her activity, but she notes that if she does get fatigued, she rests.   Reviewed monitor results with her today. Monitor showed one episode of SVT, only 12 beats long. The 25 patient triggered events were all sinus. We reviewed her actual monitor images together today.   She stopped the lisinopril as she did not feel well on it. Felt better off of the medication. Has been following her BP readings at home, has been well controlled.   We reviewed her lipid results today. Cholesterol very elevated, in line with HeFH. We discussed this and how it is managed. She did not start statin; she has instead been focused on diet changes. Has cut out all added sugar. Started Atkins diet program.   We discussed options going forward. She admits she is not great with pills and would prefer to discuss possible PCSK9i with pharmacists.   CT not done due to cost. We did have our social worker reach out to her, but no return phone call from patient.   Denies chest pain, shortness of breath at rest or with normal exertion. No PND, orthopnea, LE edema or unexpected weight gain. No syncope.  FH update: niece diagnosed with Lynch syndrome recently.  Past Medical History:  Diagnosis Date  . Allergy   . Anemia    sickle cell trait    . Clotting disorder ([place])   . GERD (gastroesophageal reflux disease)   . Sickle cell anemia (HCC)    trait    Past Surgical History:  Procedure Laterality Date  . BREAST SURGERY    . COSMETIC SURGERY    . TUBAL LIGATION      Current Medications: Current Outpatient Medications on File Prior to Visit  Medication Sig  . cholecalciferol (VITAMIN D) 1000 UNITS tablet Take 1,000 Units by mouth daily.  . Multiple Vitamin (MULTIVITAMIN WITH MINERALS) TABS Take 1 tablet by mouth daily.   No current facility-administered medications on file prior to visit.     Allergies:   Patient has no known allergies.   Social History   Tobacco Use  . Smoking status: Former Research scientist (life sciences)  . Smokeless tobacco: Never Used  Substance Use Topics  . Alcohol use: Yes  . Drug use: No    Family History: family history includes ALS in her mother; Cancer in her sister; HIV/AIDS in her sister; Seizures in her mother.  ROS:   Please see the history of present illness.  Additional pertinent ROS otherwise unremarkable.   EKGs/Labs/Other Studies Reviewed:    The following studies were reviewed today: Monitor 09/30/19 6.5 days of data recorded on Zio monitor. Patient had a min HR of 50 bpm, max HR of 210 bpm, and avg HR of 82 bpm. Predominant underlying rhythm was Sinus Rhythm.  No VT, atrial fibrillation, high degree block, or pauses noted. 1 run of Supraventricular Tachycardia occurred lasting 12 beats with a max rate of 210 bpm (avg 185 bpm). Supraventricular Tachycardia was detected within +/- 45 seconds of symptomatic patient event(s). Isolated atrial and ventricular ectopy was rare (<1%). There were 26 triggered events, most sinus but one near timing of SVT event.   EKG:  EKG is personally reviewed.  The ekg ordered 08/31/19 demonstrates NSR.  Recent Labs: No results found for requested labs within last 8760 hours.  Recent Lipid Panel    Component Value Date/Time   CHOL 291 (H) 08/31/2019 1520   TRIG  177 (H) 08/31/2019 1520   HDL 53 08/31/2019 1520   CHOLHDL 5.5 (H) 08/31/2019 1520   LDLCALC 205 (H) 08/31/2019 1520    Physical Exam:    VS:  BP 138/60 (BP Location: Left Arm, Patient Position: Sitting, Cuff Size: Normal)   Pulse 89   Temp (!) 97.3 F (36.3 C)   Ht _0  (1.676 m)   Wt 145 lb (65.8 kg)   BMI 23.40 kg/m     Wt Readings from Last 3 Encounters:  10/13/19 145 lb (65.8 kg)  08/31/19 149 lb 12.8 oz (67.9 kg)  09/29/12 142 lb (64.4 kg)    GEN: Well nourished, well developed in no acute distress HEENT: Normal, moist mucous membranes NECK: No JVD CARDIAC: regular rhythm, normal S1 and S2, no rubs or gallops. No murmur. VASCULAR: Radial and DP pulses 2+ bilaterally. No carotid bruits RESPIRATORY:  Clear to auscultation without rales, wheezing or rhonchi  ABDOMEN: Soft, non-tender, non-distended MUSCULOSKELETAL:  Ambulates independently SKIN: Warm and dry, no edema NEUROLOGIC:  Alert and oriented x 3. No focal neuro deficits noted. PSYCHIATRIC:  Normal affect   ASSESSMENT:    1. Palpitation   2. Elevated LDL cholesterol level   3. Mixed hyperlipidemia   4. Chronic fatigue   5. Cardiac risk counseling   6. Counseling on health promotion and disease prevention   7. 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 She would like to talk to our PharmD clinic to see what she would need to do to  get PCSK9i treatment.  Palpitations: -7 day monitor with only one brief episode of SVT. Vast majority of symptomatic episodes were normal sinus rhythm  Cardiac risk counseling and prevention recommendations: -recommend heart healthy/Mediterranean diet, with whole grains, fruits, vegetable, fish, lean meats, nuts, and olive oil. Limit salt. -recommend moderate walking, 3-5 times/week for 30-50 minutes each session. Aim for at least 150 minutes.week. Goal should be pace of 3 miles/hours, or walking 1.5 miles in 30 minutes -recommend avoidance of tobacco products. Avoid excess alcohol. The 10-year ASCVD risk score Mikey Bussing DC Brooke Bonito., et al., 2013) is: 8.5%   Values used to calculate the score:     Age: 62 years     Sex: Female     Is Non-Hispanic African American: Yes     Diabetic: No  Tobacco smoker: No     Systolic Blood Pressure: 483 mmHg     Is BP treated: No     HDL Cholesterol: 53 mg/dL     Total Cholesterol: 291 mg/dL    Plan for follow up: 1 year or sooner as needed  Total time of encounter: 49 minutes total time of encounter, including 39 minutes spent in face-to-face patient care. This time includes coordination of care and counseling regarding test results. Remainder of non-face-to-face time involved reviewing chart documents/testing relevant to the patient encounter and documentation in the medical record.  In room at 3:50 PM, out at 4:29 PM.  Marisa Dresser, MD, PhD [place]  [facility]    Medication Adjustments/Labs and Tests Ordered: Current medicines are reviewed at length with the patient today.  Concerns regarding medicines are outlined above.  No orders of the defined types were placed in this encounter.  No orders of the defined types were placed in this encounter.   Patient Instructions  Medication Instructions:  Your Physician recommend you continue on your current medication as directed.    *If you need a refill on your cardiac medications  before your next appointment, please call your pharmacy*   Lab Work: None  Testing/Procedures: None   Follow-Up: At [facility], you and your health needs are our priority.  As part of our continuing mission to provide you with exceptional heart care, we have created designated Provider Care Teams.  These Care Teams include your primary Cardiologist (physician) and Advanced Practice Providers (APPs -  Physician Assistants and Nurse Practitioners) who all work together to provide you with the care you need, when you need it.  We recommend signing up for the patient portal called "MyChart".  Sign up information is provided on this After Visit Summary.  MyChart is used to connect with patients for Virtual Visits (Telemedicine).  Patients are able to view lab/test results, encounter notes, upcoming appointments, etc.  Non-urgent messages can be sent to your provider as well.   To learn more about what you can do with MyChart, go to [email or web address].    Your next appointment:   1 year(s)  The format for your next appointment:   In Person  Provider:   Buford Dresser, MD  First available with Lipid clinic   Signed, Marisa Dresser, MD PhD 10/13/2019 4:52 PM    [place]

## 2019-10-13 NOTE — Patient Instructions (Signed)
Medication Instructions:  Your Physician recommend you continue on your current medication as directed.    *If you need a refill on your cardiac medications before your next appointment, please call your pharmacy*   Lab Work: None  Testing/Procedures: None   Follow-Up: At [facility], you and your health needs are our priority.  As part of our continuing mission to provide you with exceptional heart care, we have created designated Provider Care Teams.  These Care Teams include your primary Cardiologist (physician) and Advanced Practice Providers (APPs -  Physician Assistants and Nurse Practitioners) who all work together to provide you with the care you need, when you need it.  We recommend signing up for the patient portal called "MyChart".  Sign up information is provided on this After Visit Summary.  MyChart is used to connect with patients for Virtual Visits (Telemedicine).  Patients are able to view lab/test results, encounter notes, upcoming appointments, etc.  Non-urgent messages can be sent to your provider as well.   To learn more about what you can do with MyChart, go to [email or web address].    Your next appointment:   1 year(s)  The format for your next appointment:   In Person  Provider:   Jodelle Red, MD  First available with Lipid clinic

## 2019-10-15 ENCOUNTER — Telehealth: Payer: Self-pay | Admitting: Cardiology

## 2019-10-15 NOTE — Telephone Encounter (Signed)
Patient has two appointments to see the pharmacist at the lipid clinic. She wants to know if she needs both appts or if one should be cancelled. Please advise.

## 2019-10-15 NOTE — Telephone Encounter (Signed)
Only 1 appointment needed. Patient will like to keep appointment for June.  Duplicate cancelled.

## 2019-10-19 ENCOUNTER — Ambulatory Visit: Payer: Self-pay

## 2019-11-03 ENCOUNTER — Ambulatory Visit: Payer: Self-pay | Admitting: Cardiology

## 2019-11-21 ENCOUNTER — Encounter: Payer: Self-pay | Admitting: Cardiology

## 2019-11-21 DIAGNOSIS — E7800 Pure hypercholesterolemia, unspecified: Secondary | ICD-10-CM | POA: Insufficient documentation

## 2019-11-22 ENCOUNTER — Ambulatory Visit (INDEPENDENT_AMBULATORY_CARE_PROVIDER_SITE_OTHER): Payer: Self-pay | Admitting: Pharmacist Clinician (PhC)/ Clinical Pharmacy Specialist

## 2019-11-22 ENCOUNTER — Other Ambulatory Visit: Payer: Self-pay

## 2019-11-22 DIAGNOSIS — E7800 Pure hypercholesterolemia, unspecified: Secondary | ICD-10-CM

## 2019-11-22 NOTE — Progress Notes (Signed)
11/22/2019 Marisa Fuller 03/15/1961 [id number]   HPI:  Marisa Fuller is a 59 y.o. female patient of Dr Cristal Deer, who presents today for a lipid clinic evaluation.  Her medical history is significant only for palpitations and fatigue.  She has no current prescription medications, having stopped lisinopril because she did not feel well while taking.  Labs drawn prior to her appointment showed a TC of 291 with LDL of 205.  She was prescribed a statin drug at one time but never filled the prescription.  She was also recommended to get at [facility], but cannot currently afford.  Had Covid earlier this year (January/February), was not hospitalized.  Feels as though she still is missing some of her momentum, some SOB.    Currently she takes no prescription medications.  Previously took lisinopril for hypertension, but now just drinking hibiscus tea and has no current issues.  She is a bit overwhelmed with the idea of having such a high LDL level.     Current Medications: none  Cholesterol Goals: LDL < 100   Intolerant/previously tried: lisinopril made her feel "terrible"   Family history: father unknown, mother currently 5, epilepsy, no stroke, MI or other cardiac intervention; 2 children in early 39's no issues  Diet: likes Atkins meals, eats out some; eats quite a bit of fish, broccoli, spinach, tries to eat beans regularly; salads on a regular basis.  Gave up chocolate; eats almonds, cashews and pistachios regularly;  Lactose intolerant  Exercise:  Walks regularly, doesn't sit still, has farm with garden  Labs: 3/21:  TC 291, TG 177, HDL 53, LDL 205 (no medication)   Current Outpatient Medications  Medication Sig Dispense Refill  . cholecalciferol (VITAMIN D) 1000 UNITS tablet Take 1,000 Units by mouth daily.    . Multiple Vitamin (MULTIVITAMIN WITH MINERALS) TABS Take 1 tablet by mouth daily.     No current facility-administered medications for this visit.    No Known  Allergies  Past Medical History:  Diagnosis Date  . Allergy   . Anemia    sickle cell trait  . Clotting disorder (HCC)   . GERD (gastroesophageal reflux disease)   . Sickle cell anemia (HCC)    trait    Height 5\' 6"  (1.676 m), weight 146 lb (66.2 kg).   Hypercholesterolemia with LDL greater than 190 mg/dL Patient with familial hyperlipidemia, LDL at 205 baseline on no medications.  She has not tried any statin drugs and is quite hesitant to do so.  Because of her levels, she would most likely not reach LDL goal with just a statin.  Reviewed options for lowering LDL cholesterol, including statins, ezetimibe, PCSK-9 inhibitors and bempedoic acid.  Discussed mechanisms of action, dosing, side effects and potential decreases in LDL cholesterol.  Answered all patient questions.  Patient was feeling a little overwhelmed by all this information.  She would like to think about it for a few days.  She was given the application, as she is without insurance.  She will let [employer] know in the next few days and return the application should she decide to move forward with this.  She will prefer the Pushtronix monthly dose.     [place] PharmD CPP [facility] [address] [place], [place] [id number]

## 2019-11-22 NOTE — Assessment & Plan Note (Signed)
Patient with familial hyperlipidemia, LDL at 205 baseline on no medications.  She has not tried any statin drugs and is quite hesitant to do so.  Because of her levels, she would most likely not reach LDL goal with just a statin.  Reviewed options for lowering LDL cholesterol, including statins, ezetimibe, PCSK-9 inhibitors and bempedoic acid.  Discussed mechanisms of action, dosing, side effects and potential decreases in LDL cholesterol.  Answered all patient questions.  Patient was feeling a little overwhelmed by all this information.  She would like to think about it for a few days.  She was given the [employer] application, as she is without insurance.  She will let us know in the next few days and return the application should she decide to move forward with this.  She will prefer the Pushtronix monthly dose.

## 2019-11-22 NOTE — Patient Instructions (Addendum)
.    Your Results:             Your most recent labs Goal  Total Cholesterol 291 < 200  Triglycerides 177 < 150  HDL (happy/good cholesterol) 53 > 40  LDL (lousy/bad cholesterol 205 < 100   Medication changes:  If you decide to try the Repatha, please fill out the patient assistance form and fax it back to me at [phone] (please put Attention Belenda Cruise)   You can try red yeast rice supplement, it is the plant that we use to create statin drugs - for most people the drop in cholesterol is minor, but you are welcome to try.  Any questions, please don't hesitate to call -Earl Zellmer/Raquel at [phone]  Thank you for choosing [facility]

## 2020-10-17 ENCOUNTER — Encounter: Payer: Self-pay | Admitting: Cardiology

## 2020-10-20 ENCOUNTER — Ambulatory Visit: Payer: Self-pay | Admitting: Cardiology

## 2021-06-20 ENCOUNTER — Other Ambulatory Visit: Payer: Self-pay

## 2021-06-20 ENCOUNTER — Ambulatory Visit
Admission: EM | Admit: 2021-06-20 | Discharge: 2021-06-20 | Disposition: A | Discharge status: Home / Self Care | Payer: Self-pay | Attending: Internal Medicine | Admitting: Internal Medicine

## 2021-06-20 ENCOUNTER — Telehealth: Payer: Self-pay

## 2021-06-20 DIAGNOSIS — M25551 Pain in right hip: Secondary | ICD-10-CM

## 2021-06-20 MED ORDER — NAPROXEN 500 MG PO TABS: 500.0000 mg | ORAL_TABLET | Freq: Two times a day (BID) | ORAL | 0 refills | Status: DC | PRN

## 2021-06-20 NOTE — ED Provider Notes (Addendum)
[facility]    CSN: [id number] Arrival date & time: 06/20/21  1624      History   Chief Complaint Chief Complaint  Patient presents with   right leg pain    HPI Marisa Fuller is a 61 y.o. female.   Patient presents with right hip and groin pain that started a few weeks ago.  Denies any apparent injury.  Denies any issues with chronic hip pain.  She reports the pain has become more severe over the past few days.  Denies any numbness or tingling to the area.  Has taken over-the-counter Tylenol and Aleve with minimal improvement in symptoms.  Patient went to see a chiropractor who did x-ray of the area with no significant abnormality on x-ray.  Patient reports that the pain starts in the right hip and radiates around right groin into upper extremity.    Past Medical History:  Diagnosis Date   Allergy    Anemia    sickle cell trait   Clotting disorder (HCC)    GERD (gastroesophageal reflux disease)    Sickle cell anemia (HCC)    trait    Patient Active Problem List   Diagnosis Date Noted   Hypercholesterolemia with LDL greater than 190 mg/dL 11/21/2019   Encounter for long-term (current) use of antiplatelets/antithrombotics 04/20/2012   Deep venous thrombosis ([place]) 04/15/2012    Past Surgical History:  Procedure Laterality Date   BREAST SURGERY     COSMETIC SURGERY     TUBAL LIGATION      OB History   No obstetric history on file.      Home Medications    Prior to Admission medications   Medication Sig Start Date End Date Taking? Authorizing Provider  cholecalciferol (VITAMIN D) 1000 UNITS tablet Take 1,000 Units by mouth daily.    [provider]  Multiple Vitamin (MULTIVITAMIN WITH MINERALS) TABS Take 1 tablet by mouth daily.    [provider]  naproxen (NAPROSYN) 500 MG tablet Take 1 tablet (500 mg total) by mouth 2 (two) times daily as needed for mild pain or moderate pain. 06/20/21   Teodora Medici, FNP    Family  History Family History  Problem Relation Age of Onset   Seizures Mother    ALS Mother    Cancer Sister    HIV/AIDS Sister     Social History Social History   Tobacco Use   Smoking status: Former   Smokeless tobacco: Never  Substance Use Topics   Alcohol use: Yes   Drug use: No     Allergies   Patient has no known allergies.   Review of Systems Review of Systems Per HPI  Physical Exam Triage Vital Signs ED Triage Vitals  Enc Vitals Group     BP 06/20/21 1756 (!) 177/101     Pulse Rate 06/20/21 1756 94     Resp 06/20/21 1756 18     Temp 06/20/21 1756 98 F (36.7 C)     Temp Source 06/20/21 1756 Oral     SpO2 06/20/21 1756 98 %     Weight --      Height --      Head Circumference --      Peak Flow --      Pain Score 06/20/21 1757 0     Pain Loc --      Pain Edu? --      Excl. in [place]? --    No data found.  Updated Vital  Signs BP (!) 177/101 (BP Location: Left Arm)    Pulse 94    Temp 98 F (36.7 C) (Oral)    Resp 18    SpO2 98%   Visual Acuity Right Eye Distance:   Left Eye Distance:   Bilateral Distance:    Right Eye Near:   Left Eye Near:    Bilateral Near:     Physical Exam Constitutional:      General: She is not in acute distress.    Appearance: Normal appearance. She is not toxic-appearing or diaphoretic.  HENT:     Head: Normocephalic and atraumatic.  Eyes:     Extraocular Movements: Extraocular movements intact.     Conjunctiva/sclera: Conjunctivae normal.  Pulmonary:     Effort: Pulmonary effort is normal.  Musculoskeletal:     Right hip: Tenderness present. No deformity, lacerations, bony tenderness or crepitus. Normal range of motion. Normal strength.     Left hip: Normal.       Legs:     Comments: Tenderness to palpation to right groin at circled area on diagram.  She also reports that she has tenderness to palpation to right medial thigh at area circled on diagram.  Mild tenderness to palpation to right lateral hip.  No erythema,  bruising, swelling noted to any part of right extremity.  Neurovascular intact.  Capillary refill normal.  All pulses including pedal pulses normal.  Neurological:     General: No focal deficit present.     Mental Status: She is alert and oriented to person, place, and time. Mental status is at baseline.  Psychiatric:        Mood and Affect: Mood normal.        Behavior: Behavior normal.        Thought Content: Thought content normal.        Judgment: Judgment normal.     UC Treatments / Results  Labs (all labs ordered are listed, but only abnormal results are displayed) Labs Reviewed - No data to display  EKG   Radiology No results found.  Procedures Procedures (including critical care time)  Medications Ordered in UC Medications - No data to display  Initial Impression / Assessment and Plan / UC Course  I have reviewed the triage vital signs and the nursing notes.  Pertinent labs & imaging results that were available during my care of the patient were reviewed by me and considered in my medical decision making (see chart for details).     After further review of the hip x-ray completed by chiropractor in June 14, 2021, there was no obvious bony abnormality.  Advised patient that it would best for her to have further imaging and evaluation.  Low suspicion for DVT but patient does have history of DVT multiple years ago so advised patient that it would be best to have this ruled out by ultrasound imaging.  I am unable to order ultrasound imaging given timing of the day and patient will need to go to the hospital to have this completed.  Patient declined going to the hospital to the emergency department for further evaluation and management.  Risks associated with not getting this ultrasound completed were discussed with patient.  Patient voiced understanding.  Patient was agreeable to anti-inflammatory medications and immediate follow-up with orthopedist for further evaluation  and management of right hip and groin pain.  Patient has elevated blood pressure reading but this appears to be related to severe pain.  Discussed strict ER precautions.  Patient verbalized understanding and was agreeable with plan. Final Clinical Impressions(s) / UC Diagnoses   Final diagnoses:  Right hip pain     Discharge Instructions      You have been prescribed an medication to take as needed for pain.  You may also alternate ice and heat application to affected area.  Please follow-up with provided contact information for orthopedist for further evaluation and management.    ED Prescriptions     Medication Sig Dispense Auth. Provider   naproxen (NAPROSYN) 500 MG tablet Take 1 tablet (500 mg total) by mouth 2 (two) times daily as needed for mild pain or moderate pain. 30 tablet [place], Michele Rockers, [place]      PDMP not reviewed this encounter.   Teodora Medici, [place] 06/20/21 1846    Teodora Medici,  06/20/21 [phone]

## 2021-06-20 NOTE — ED Triage Notes (Signed)
Pt c/o right leg pain onset ~ 1 week ago that is getting worse. Denies any injury or trauma to area. Pain located in right inner thigh, right anterior thigh. Described as sharp pain.

## 2021-06-20 NOTE — Discharge Instructions (Signed)
You have been prescribed an medication to take as needed for pain.  You may also alternate ice and heat application to affected area.  Please follow-up with provided contact information for orthopedist for further evaluation and management.

## 2021-06-20 NOTE — Telephone Encounter (Signed)
Rx sent to [facility] since original pharmacy was closed provider approved

## 2021-06-21 ENCOUNTER — Ambulatory Visit (HOSPITAL_COMMUNITY)
Admission: RE | Admit: 2021-06-21 | Discharge: 2021-06-21 | Disposition: A | Discharge status: Home / Self Care | Payer: Self-pay | Source: Ambulatory Visit | Attending: Family Medicine | Admitting: Family Medicine

## 2021-06-21 ENCOUNTER — Other Ambulatory Visit (HOSPITAL_COMMUNITY): Payer: Self-pay | Admitting: Family Medicine

## 2021-06-21 ENCOUNTER — Other Ambulatory Visit: Payer: Self-pay | Admitting: Family Medicine

## 2021-06-21 DIAGNOSIS — M7989 Other specified soft tissue disorders: Secondary | ICD-10-CM | POA: Insufficient documentation

## 2021-06-21 DIAGNOSIS — M79604 Pain in right leg: Secondary | ICD-10-CM

## 2021-06-21 NOTE — Progress Notes (Signed)
Right lower extremity venous duplex completed. Refer to "CV Proc" under chart review to view preliminary results.  06/21/2021 4:08 PM Eula Fried., MHA, RVT, RDCS, RDMS

## 2021-09-24 ENCOUNTER — Ambulatory Visit (HOSPITAL_COMMUNITY): Admission: RE | Admit: 2021-09-24 | Payer: Self-pay | Source: Home / Self Care | Admitting: Orthopedic Surgery

## 2021-09-24 ENCOUNTER — Encounter (HOSPITAL_COMMUNITY): Admission: RE | Payer: Self-pay | Source: Home / Self Care

## 2021-09-24 SURGERY — ARTHROSCOPY, KNEE
Anesthesia: Choice | Site: Knee | Laterality: Right

## 2022-07-18 ENCOUNTER — Inpatient Hospital Stay (HOSPITAL_COMMUNITY)
Admission: EM | Admit: 2022-07-18 | Discharge: 2022-07-20 | DRG: 322 | Disposition: A | Discharge status: Home / Self Care | Payer: Medicaid Other | Attending: Cardiovascular Disease | Admitting: Cardiovascular Disease

## 2022-07-18 ENCOUNTER — Other Ambulatory Visit: Payer: Self-pay

## 2022-07-18 ENCOUNTER — Encounter (HOSPITAL_COMMUNITY): Payer: Self-pay | Admitting: Physician Assistant

## 2022-07-18 ENCOUNTER — Encounter (HOSPITAL_COMMUNITY)
Admission: EM | Disposition: A | Discharge status: Home / Self Care | Payer: Self-pay | Source: Home / Self Care | Attending: Cardiovascular Disease

## 2022-07-18 DIAGNOSIS — I2121 ST elevation (STEMI) myocardial infarction involving left circumflex coronary artery: Secondary | ICD-10-CM | POA: Diagnosis present

## 2022-07-18 DIAGNOSIS — E7849 Other hyperlipidemia: Secondary | ICD-10-CM | POA: Diagnosis present

## 2022-07-18 DIAGNOSIS — I10 Essential (primary) hypertension: Secondary | ICD-10-CM | POA: Diagnosis present

## 2022-07-18 DIAGNOSIS — D573 Sickle-cell trait: Secondary | ICD-10-CM | POA: Diagnosis present

## 2022-07-18 DIAGNOSIS — I2119 ST elevation (STEMI) myocardial infarction involving other coronary artery of inferior wall: Secondary | ICD-10-CM | POA: Diagnosis not present

## 2022-07-18 DIAGNOSIS — E876 Hypokalemia: Secondary | ICD-10-CM | POA: Diagnosis present

## 2022-07-18 DIAGNOSIS — Z820 Family history of epilepsy and other diseases of the nervous system: Secondary | ICD-10-CM

## 2022-07-18 DIAGNOSIS — R4589 Other symptoms and signs involving emotional state: Secondary | ICD-10-CM | POA: Diagnosis present

## 2022-07-18 DIAGNOSIS — R079 Chest pain, unspecified: Secondary | ICD-10-CM | POA: Diagnosis present

## 2022-07-18 DIAGNOSIS — Z955 Presence of coronary angioplasty implant and graft: Secondary | ICD-10-CM

## 2022-07-18 DIAGNOSIS — I2510 Atherosclerotic heart disease of native coronary artery without angina pectoris: Secondary | ICD-10-CM | POA: Diagnosis present

## 2022-07-18 DIAGNOSIS — E7800 Pure hypercholesterolemia, unspecified: Secondary | ICD-10-CM | POA: Diagnosis not present

## 2022-07-18 DIAGNOSIS — Z830 Family history of human immunodeficiency virus [HIV] disease: Secondary | ICD-10-CM | POA: Diagnosis not present

## 2022-07-18 DIAGNOSIS — I252 Old myocardial infarction: Secondary | ICD-10-CM

## 2022-07-18 DIAGNOSIS — R519 Headache, unspecified: Secondary | ICD-10-CM | POA: Diagnosis present

## 2022-07-18 DIAGNOSIS — I4729 Other ventricular tachycardia: Secondary | ICD-10-CM | POA: Diagnosis not present

## 2022-07-18 HISTORY — PX: CORONARY STENT INTERVENTION: CATH118234

## 2022-07-18 HISTORY — DX: Atherosclerotic heart disease of native coronary artery without angina pectoris: I25.10

## 2022-07-18 HISTORY — DX: Hyperlipidemia, unspecified: E78.5

## 2022-07-18 HISTORY — PX: LEFT HEART CATH AND CORONARY ANGIOGRAPHY: CATH118249

## 2022-07-18 HISTORY — DX: Acute embolism and thrombosis of unspecified deep veins of unspecified lower extremity: I82.409

## 2022-07-18 LAB — PROTIME-INR
INR: 1 (ref 0.8–1.2)
Prothrombin Time: 13.3 seconds (ref 11.4–15.2)

## 2022-07-18 LAB — CBC WITH DIFFERENTIAL/PLATELET
Abs Immature Granulocytes: 0.04 10*3/uL (ref 0.00–0.07)
Basophils Absolute: 0 10*3/uL (ref 0.0–0.1)
Basophils Relative: 1 %
Eosinophils Absolute: 0.2 10*3/uL (ref 0.0–0.5)
Eosinophils Relative: 2 %
HCT: 33.8 % — ABNORMAL LOW (ref 36.0–46.0)
Hemoglobin: 12 g/dL (ref 12.0–15.0)
Immature Granulocytes: 1 %
Lymphocytes Relative: 37 %
Lymphs Abs: 3.1 10*3/uL (ref 0.7–4.0)
MCH: 31.7 pg (ref 26.0–34.0)
MCHC: 35.5 g/dL (ref 30.0–36.0)
MCV: 89.2 fL (ref 80.0–100.0)
Monocytes Absolute: 0.5 10*3/uL (ref 0.1–1.0)
Monocytes Relative: 5 %
Neutro Abs: 4.6 10*3/uL (ref 1.7–7.7)
Neutrophils Relative %: 54 %
Platelets: 367 10*3/uL (ref 150–400)
RBC: 3.79 MIL/uL — ABNORMAL LOW (ref 3.87–5.11)
RDW: 12.7 % (ref 11.5–15.5)
WBC: 8.4 10*3/uL (ref 4.0–10.5)
nRBC: 0 % (ref 0.0–0.2)

## 2022-07-18 LAB — HEMOGLOBIN A1C
Hgb A1c MFr Bld: 6.3 % — ABNORMAL HIGH (ref 4.8–5.6)
Mean Plasma Glucose: 134.11 mg/dL

## 2022-07-18 LAB — POCT I-STAT, CHEM 8
BUN: 15 mg/dL (ref 8–23)
Calcium, Ion: 1.18 mmol/L (ref 1.15–1.40)
Chloride: 108 mmol/L (ref 98–111)
Creatinine, Ser: 0.8 mg/dL (ref 0.44–1.00)
Glucose, Bld: 147 mg/dL — ABNORMAL HIGH (ref 70–99)
HCT: 36 % (ref 36.0–46.0)
Hemoglobin: 12.2 g/dL (ref 12.0–15.0)
Potassium: 3.1 mmol/L — ABNORMAL LOW (ref 3.5–5.1)
Sodium: 143 mmol/L (ref 135–145)
TCO2: 20 mmol/L — ABNORMAL LOW (ref 22–32)

## 2022-07-18 LAB — APTT: aPTT: 26 seconds (ref 24–36)

## 2022-07-18 LAB — POCT ACTIVATED CLOTTING TIME: Activated Clotting Time: 342 seconds

## 2022-07-18 LAB — TROPONIN I (HIGH SENSITIVITY)
Troponin I (High Sensitivity): 27 ng/L — ABNORMAL HIGH (ref <18)
Troponin I (High Sensitivity): 3422 ng/L (ref <18)

## 2022-07-18 LAB — COMPREHENSIVE METABOLIC PANEL
ALT: 13 U/L (ref 0–44)
AST: 21 U/L (ref 15–41)
Albumin: 3.8 g/dL (ref 3.5–5.0)
Alkaline Phosphatase: 90 U/L (ref 38–126)
Anion gap: 9 (ref 5–15)
BUN: 14 mg/dL (ref 8–23)
CO2: 20 mmol/L — ABNORMAL LOW (ref 22–32)
Calcium: 8.7 mg/dL — ABNORMAL LOW (ref 8.9–10.3)
Chloride: 108 mmol/L (ref 98–111)
Creatinine, Ser: 1.01 mg/dL — ABNORMAL HIGH (ref 0.44–1.00)
GFR, Estimated: >60 mL/min (ref >60)
Glucose, Bld: 137 mg/dL — ABNORMAL HIGH (ref 70–99)
Potassium: 2.9 mmol/L — ABNORMAL LOW (ref 3.5–5.1)
Sodium: 137 mmol/L (ref 135–145)
Total Bilirubin: 0.6 mg/dL (ref 0.3–1.2)
Total Protein: 7 g/dL (ref 6.5–8.1)

## 2022-07-18 LAB — MAGNESIUM: Magnesium: 2 mg/dL (ref 1.7–2.4)

## 2022-07-18 LAB — TSH: TSH: 0.988 u[IU]/mL (ref 0.350–4.500)

## 2022-07-18 LAB — MRSA NEXT GEN BY PCR, NASAL: MRSA by PCR Next Gen: NOT DETECTED

## 2022-07-18 SURGERY — LEFT HEART CATH AND CORONARY ANGIOGRAPHY
Anesthesia: LOCAL

## 2022-07-18 MED ORDER — TICAGRELOR 90 MG PO TABS
ORAL_TABLET | ORAL | Status: DC | PRN
  Administered 2022-07-18: 180 mg via ORAL

## 2022-07-18 MED ORDER — VERAPAMIL HCL 2.5 MG/ML IV SOLN
INTRAVENOUS | Status: DC | PRN
  Administered 2022-07-18: 10 mL via INTRA_ARTERIAL

## 2022-07-18 MED ORDER — SODIUM CHLORIDE 0.9% FLUSH
3.0000 mL | Freq: Two times a day (BID) | INTRAVENOUS | Status: DC
  Administered 2022-07-18 – 2022-07-19 (×3): 3 mL via INTRAVENOUS

## 2022-07-18 MED ORDER — NITROGLYCERIN 1 MG/10 ML FOR IR/CATH LAB
INTRA_ARTERIAL | Status: AC
  Filled 2022-07-18: qty 10

## 2022-07-18 MED ORDER — HEPARIN (PORCINE) IN NACL 1000-0.9 UT/500ML-% IV SOLN
INTRAVENOUS | Status: AC
  Filled 2022-07-18: qty 500

## 2022-07-18 MED ORDER — SODIUM CHLORIDE 0.9 % IV SOLN: INTRAVENOUS | Status: AC

## 2022-07-18 MED ORDER — TICAGRELOR 90 MG PO TABS
90.0000 mg | ORAL_TABLET | Freq: Two times a day (BID) | ORAL | Status: DC
  Administered 2022-07-18 – 2022-07-20 (×4): 90 mg via ORAL
  Filled 2022-07-18 (×4): qty 1

## 2022-07-18 MED ORDER — ASPIRIN 81 MG PO CHEW
81.0000 mg | CHEWABLE_TABLET | Freq: Every day | ORAL | Status: DC
  Administered 2022-07-19 – 2022-07-20 (×2): 81 mg via ORAL
  Filled 2022-07-18 (×2): qty 1

## 2022-07-18 MED ORDER — SODIUM CHLORIDE 0.9 % IV SOLN
250.0000 mL | INTRAVENOUS | Status: DC | PRN
  Administered 2022-07-19: 250 mL via INTRAVENOUS

## 2022-07-18 MED ORDER — LIDOCAINE HCL (PF) 1 % IJ SOLN
INTRAMUSCULAR | Status: DC | PRN
  Administered 2022-07-18: 2 mL

## 2022-07-18 MED ORDER — HEPARIN (PORCINE) IN NACL 1000-0.9 UT/500ML-% IV SOLN
INTRAVENOUS | Status: DC | PRN
  Administered 2022-07-18 (×2): 500 mL

## 2022-07-18 MED ORDER — HEPARIN SODIUM (PORCINE) 1000 UNIT/ML IJ SOLN
INTRAMUSCULAR | Status: AC
  Filled 2022-07-18: qty 10

## 2022-07-18 MED ORDER — TIROFIBAN HCL IV 12.5 MG/250 ML
INTRAVENOUS | Status: AC | PRN
  Administered 2022-07-18: .075 ug/kg/min via INTRAVENOUS

## 2022-07-18 MED ORDER — HEPARIN SODIUM (PORCINE) 1000 UNIT/ML IJ SOLN
INTRAMUSCULAR | Status: DC | PRN
  Administered 2022-07-18: 8000 [IU] via INTRAVENOUS

## 2022-07-18 MED ORDER — NITROGLYCERIN 1 MG/10 ML FOR IR/CATH LAB
INTRA_ARTERIAL | Status: DC | PRN
  Administered 2022-07-18: 200 ug via INTRA_ARTERIAL

## 2022-07-18 MED ORDER — TIROFIBAN HCL IN NACL 5-0.9 MG/100ML-% IV SOLN
INTRAVENOUS | Status: AC
  Filled 2022-07-18: qty 100

## 2022-07-18 MED ORDER — TIROFIBAN HCL IN NACL 5-0.9 MG/100ML-% IV SOLN: 0.1500 ug/kg/min | INTRAVENOUS | Status: DC

## 2022-07-18 MED ORDER — LABETALOL HCL 5 MG/ML IV SOLN: 10.0000 mg | INTRAVENOUS | Status: AC | PRN

## 2022-07-18 MED ORDER — MIDAZOLAM HCL 2 MG/2ML IJ SOLN
INTRAMUSCULAR | Status: DC | PRN
  Administered 2022-07-18 (×2): 1 mg via INTRAVENOUS

## 2022-07-18 MED ORDER — FENTANYL CITRATE (PF) 100 MCG/2ML IJ SOLN
INTRAMUSCULAR | Status: AC
  Filled 2022-07-18: qty 2

## 2022-07-18 MED ORDER — ONDANSETRON HCL 4 MG/2ML IJ SOLN: 4.0000 mg | Freq: Four times a day (QID) | INTRAMUSCULAR | Status: DC | PRN

## 2022-07-18 MED ORDER — POTASSIUM CHLORIDE CRYS ER 20 MEQ PO TBCR
40.0000 meq | EXTENDED_RELEASE_TABLET | ORAL | Status: AC
  Administered 2022-07-18 (×2): 40 meq via ORAL
  Filled 2022-07-18 (×2): qty 2

## 2022-07-18 MED ORDER — OXYCODONE HCL 5 MG PO TABS: 5.0000 mg | ORAL_TABLET | ORAL | Status: DC | PRN

## 2022-07-18 MED ORDER — HYDRALAZINE HCL 20 MG/ML IJ SOLN: 10.0000 mg | INTRAMUSCULAR | Status: AC | PRN

## 2022-07-18 MED ORDER — LIDOCAINE HCL (PF) 1 % IJ SOLN
INTRAMUSCULAR | Status: AC
  Filled 2022-07-18: qty 30

## 2022-07-18 MED ORDER — IOHEXOL 350 MG/ML SOLN
INTRAVENOUS | Status: DC | PRN
  Administered 2022-07-18: 115 mL

## 2022-07-18 MED ORDER — POTASSIUM CHLORIDE IN NACL 40-0.9 MEQ/L-% IV SOLN
INTRAVENOUS | Status: DC
  Filled 2022-07-18 (×2): qty 1000

## 2022-07-18 MED ORDER — TIROFIBAN HCL IN NACL 5-0.9 MG/100ML-% IV SOLN
0.1500 ug/kg/min | INTRAVENOUS | Status: AC
  Administered 2022-07-18: 0.15 ug/kg/min via INTRAVENOUS

## 2022-07-18 MED ORDER — ATORVASTATIN CALCIUM 80 MG PO TABS
80.0000 mg | ORAL_TABLET | Freq: Every day | ORAL | Status: DC
  Administered 2022-07-18 – 2022-07-20 (×3): 80 mg via ORAL
  Filled 2022-07-18 (×4): qty 1

## 2022-07-18 MED ORDER — FENTANYL CITRATE (PF) 100 MCG/2ML IJ SOLN
INTRAMUSCULAR | Status: DC | PRN
  Administered 2022-07-18 (×2): 25 ug via INTRAVENOUS

## 2022-07-18 MED ORDER — ONDANSETRON HCL 4 MG/2ML IJ SOLN
INTRAMUSCULAR | Status: DC | PRN
  Administered 2022-07-18: 4 mg via INTRAVENOUS

## 2022-07-18 MED ORDER — TIROFIBAN (AGGRASTAT) BOLUS VIA INFUSION
INTRAVENOUS | Status: DC | PRN
  Administered 2022-07-18: 1800 ug via INTRAVENOUS

## 2022-07-18 MED ORDER — METOPROLOL TARTRATE 12.5 MG HALF TABLET
25.0000 mg | ORAL_TABLET | Freq: Two times a day (BID) | ORAL | Status: DC
  Administered 2022-07-18 – 2022-07-20 (×4): 25 mg via ORAL
  Filled 2022-07-18 (×4): qty 2

## 2022-07-18 MED ORDER — VERAPAMIL HCL 2.5 MG/ML IV SOLN
INTRAVENOUS | Status: AC
  Filled 2022-07-18: qty 2

## 2022-07-18 MED ORDER — MIDAZOLAM HCL 2 MG/2ML IJ SOLN
INTRAMUSCULAR | Status: AC
  Filled 2022-07-18: qty 2

## 2022-07-18 MED ORDER — ACETAMINOPHEN 325 MG PO TABS
650.0000 mg | ORAL_TABLET | ORAL | Status: DC | PRN
  Administered 2022-07-18 – 2022-07-19 (×4): 650 mg via ORAL
  Filled 2022-07-18 (×4): qty 2

## 2022-07-18 MED ORDER — SODIUM CHLORIDE 0.9% FLUSH: 3.0000 mL | INTRAVENOUS | Status: DC | PRN

## 2022-07-18 SURGICAL SUPPLY — 19 items
BALL SAPPHIRE NC24 3.0X12 (BALLOONS) ×1
BALLN EMERGE MR 2.0X12 (BALLOONS) ×1
BALLOON EMERGE MR 2.0X12 (BALLOONS) IMPLANT
BALLOON SAPPHIRE NC24 3.0X12 (BALLOONS) IMPLANT
CATH 5FR JL3.5 JR4 ANG PIG MP (CATHETERS) IMPLANT
CATH VISTA GUIDE 6FR XBLAD3.5 (CATHETERS) IMPLANT
DEVICE RAD COMP TR BAND LRG (VASCULAR PRODUCTS) IMPLANT
ELECT DEFIB PAD ADLT CADENCE (PAD) IMPLANT
GLIDESHEATH SLEND SS 6F .021 (SHEATH) IMPLANT
GUIDEWIRE INQWIRE 1.5J.035X260 (WIRE) IMPLANT
INQWIRE 1.5J .035X260CM (WIRE) ×1
KIT ENCORE 26 ADVANTAGE (KITS) IMPLANT
KIT HEART LEFT (KITS) ×1 IMPLANT
PACK CARDIAC CATHETERIZATION (CUSTOM PROCEDURE TRAY) ×1 IMPLANT
SHEATH PROBE COVER 6X72 (BAG) IMPLANT
STENT ONYX FRONTIER 2.75X18 (Permanent Stent) IMPLANT
TRANSDUCER W/STOPCOCK (MISCELLANEOUS) ×1 IMPLANT
TUBING CIL FLEX 10 FLL-RA (TUBING) ×1 IMPLANT
WIRE COUGAR XT STRL 190CM (WIRE) IMPLANT

## 2022-07-18 NOTE — H&P (Addendum)
Cardiology Admission History and Physical   Patient ID: Marisa Fuller MRN: [id number]; DOB: 1961/05/07   Admission date: 07/18/2022  PCP:  Merryl Hacker, No   [facility] Cardiologist:  Buford Dresser, MD        Chief Complaint:  chest pain  Patient Profile:   Marisa Fuller is a 62 y.o. 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  History of Present Illness:   Ms. Verley previously saw Dr. Harrell Gave in 2021 for palpitations and chest discomfort. Monitor showed NSR with only 1 12 beat run SVT associated with symptoms, but otherwise the remaining 25 triggers were NSR. Cor CT was ordered but not pursued by patient due to cost per last note with pharmD which was eval for suspected famililal HTN, LDL 205. She had been recommended to start statin and possible adjunctive therapy but did not commit to starting, overwhelmed at the time. She has not followed up since that time. Of note she was seen at urgent care 06/2021 with right hip pain with LE venous duplex neg for DVT.  She developed chest pain shortly after 2pm and EMS was called. 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 This was associated with SOB, nausea, and sweating. Initial EKG was concerning for subtle ST elevation in leads III, avF, V5-V6 so code STEMI was paged out. 2nd EKG en route showed improvement in ST elevation, then repeats demonstrated more severe diffuse ST elevation in I, II, avF, V4-V6 with ST depression/TWI in V2-V3 - max 24mm STE in V6. EMS administered 324mg  ASA and 1 SL NTG. Upon arrival to the ER she is not very talkative. She is A+Ox3 but answers in brief one or two word replies. She notes a headache after SL NTG. She continues to have ongoing BP. Last BP with EMS 100/50, pulse 93bpm. She reports she does not take any medicines, has no  allergies, has no prior hx of TIA/stroke or bleeding, no tobacco/ETOH/illicit drug use or fam hx of CAD. Labs are pending. She reports a friend is calling her husband to notify him of the events.   Past Medical History:  Diagnosis Date   Allergy    Anemia    sickle cell trait   Clotting disorder (HCC)    DVT (deep venous thrombosis) (HCC)    Remote DVT per notes   GERD (gastroesophageal reflux disease)    Sickle cell anemia (HCC)    trait    Past Surgical History:  Procedure Laterality Date   BREAST SURGERY     COSMETIC SURGERY     TUBAL LIGATION       Medications Prior to Admission: Prior to Admission medications   Medication Sig Start Date End Date Taking? Authorizing Provider  cholecalciferol (VITAMIN D) 1000 UNITS tablet Take 1,000 Units by mouth daily.    [provider]  Multiple Vitamin (MULTIVITAMIN WITH MINERALS) TABS Take 1 tablet by mouth daily.    [provider]  naproxen (NAPROSYN) 500 MG tablet Take 1 tablet (500 mg total) by mouth 2 (two) times daily as needed for mild pain or moderate pain. 06/20/21   Teodora Medici, FNP     Allergies:   No Known Allergies  Social History:   Social History   Socioeconomic History   Marital status: Single    Spouse name: Not on file  Number of children: Not on file   Years of education: Not on file   Highest education level: Not on file  Occupational History   Not on file  Tobacco Use   Smoking status: Former   Smokeless tobacco: Never  Substance and Sexual Activity   Alcohol use: Yes   Drug use: No   Sexual activity: Not on file  Other Topics Concern   Not on file  Social History Narrative   Not on file   Social Determinants of Health   Financial Resource Strain: Not on file  Food Insecurity: Not on file  Transportation Needs: Not on file  Physical Activity: Not on file  Stress: Not on file  Social Connections: Not on file  Intimate Partner Violence: Not on file    Family History:    The patient's family history includes ALS in her mother; Cancer in her sister; HIV/AIDS in her sister; Seizures in her mother.    ROS:  Please see the history of present illness.  All other ROS reviewed and negative.     Physical Exam/Data:  There were no vitals filed for this visit. No intake or output data in the 24 hours ending 07/18/22 1516    11/22/2019    2:06 PM 10/13/2019    3:48 PM 08/31/2019    1:52 PM  Last 3 Weights  Weight (lbs) 146 lb 145 lb 149 lb 12.8 oz  Weight (kg) 66.225 kg 65.772 kg 67.949 kg     There is no height or weight on file to calculate BMI.  General: Well developed, well nourished AAF, in no acute distress. Head: Normocephalic, atraumatic, sclera non-icteric, no xanthomas, nares are without discharge. Neck: Negative for carotid bruits. JVP not elevated. Lungs: Clear bilaterally to auscultation without wheezes, rales, or rhonchi. Breathing is unlabored. Heart: RRR S1 S2 without murmurs, rubs, or gallops.  Abdomen: Soft, non-tender, non-distended with normoactive bowel sounds. No rebound/guarding. Extremities: No clubbing or cyanosis. No edema. Distal pedal pulses are 2+ and equal bilaterally. Neuro: Alert and oriented X 3. Moves all extremities spontaneously. No facial asymmetry. No slurred speech. Psych:  Flat affect.  EKG: described above.  Relevant CV Studies: Monitor 2021 6.5 days of data recorded on Zio monitor. Patient had a min HR of 50 bpm, max HR of 210 bpm, and avg HR of 82 bpm. Predominant underlying rhythm was Sinus Rhythm. No VT, atrial fibrillation, high degree block, or pauses noted. 1 run of Supraventricular Tachycardia occurred lasting 12 beats with a max rate of 210 bpm (avg 185 bpm). Supraventricular Tachycardia was detected within +/- 45 seconds of symptomatic patient event(s). Isolated atrial and ventricular ectopy was rare (<1%). There were 26 triggered events, most sinus but one near timing of SVT event.   Laboratory Data:  High  Sensitivity Troponin:  No results for input(s): "TROPONINIHS" in the last 720 hours.    ChemistryNo results for input(s): "NA", "K", "CL", "CO2", "GLUCOSE", "BUN", "CREATININE", "CALCIUM", "MG", "GFRNONAA", "GFRAA", "ANIONGAP" in the last 168 hours.  No results for input(s): "PROT", "ALBUMIN", "AST", "ALT", "ALKPHOS", "BILITOT" in the last 168 hours. Lipids No results for input(s): "CHOL", "TRIG", "HDL", "LABVLDL", "LDLCALC", "CHOLHDL" in the last 168 hours. HematologyNo results for input(s): "WBC", "RBC", "HGB", "HCT", "MCV", "MCH", "MCHC", "RDW", "PLT" in the last 168 hours. Thyroid No results for input(s): "TSH", "FREET4" in the last 168 hours. BNPNo results for input(s): "BNP", "PROBNP" in the last 168 hours.  DDimer No results for input(s): "DDIMER" in the last 168  hours.   Radiology/Studies:  No results found.   Assessment and Plan:   1. Suspected acute STEMI, with ST elevation inferolaterally - to cath lab urgently - labs pending - anticipate echo as well - further recs pending outcome of cath  2. Untreated HLD - anticipate initiation of statin therapy with encouragement for close follow-up, may need PCSK9i adjunct therapy so would refer back to pharmD at discharge - ate breakfast this AM, check lipid panel in AM  3. Sickle cell trait - last Hgb in 2021 was normal with mild thrombocytosis at that time  4. Flat affect - continue to reassess during admission, but suspect situational due to acute event - no focal neurologic symptoms, more interactive during cardiac cath  5. Brief PSVT 2021 - quiescent, follow on telemetry   Risk Assessment/Risk Scores:    TIMI Risk Score for ST  Elevation MI:   The patient's TIMI risk score is 2, which indicates a 2.2% risk of all cause mortality at 30 days.        Severity of Illness: The appropriate patient status for this patient is INPATIENT. Inpatient status is judged to be reasonable and necessary in order to provide the  required intensity of service to ensure the patient's safety. The patient's presenting symptoms, physical exam findings, and initial radiographic and laboratory data in the context of their chronic comorbidities is felt to place them at high risk for further clinical deterioration. Furthermore, it is not anticipated that the patient will be medically stable for discharge from the hospital within 2 midnights of admission.   * I certify that at the point of admission it is my clinical judgment that the patient will require inpatient hospital care spanning beyond 2 midnights from the point of admission due to high intensity of service, high risk for further deterioration and high frequency of surveillance required.*   For questions or updates, please contact [place] Please consult [email or web address] for contact info under     Signed, Charlie Pitter, PA-C  07/18/2022 3:16 PM   I have personally seen and examined this patient. I agree with the assessment and plan as outlined above.  62 yo female with history of HLD and sickle cell trait being admitted with chest pain. EKG with inferolateral ST elevation c/w acute MI.  Pt alert and oriented. CV:RRR No labs to review Plan emergent cardiac cath. Further plans to follow after cath.   Lauree Chandler, MD, [facility] 07/18/2022 4:23 PM

## 2022-07-18 NOTE — Progress Notes (Signed)
K 2.9. D/w Dr. Angelena Form - plan 65meq KCl q3hr x 2. Mg level also ordered.

## 2022-07-18 NOTE — Progress Notes (Signed)
Date and time results received: 07/18/22 1902 (use smartphrase ".now" to insert current time)  Test: Troponin Critical Value: 3,422  Name of Provider Notified: Dr. Angelena Form on rounds  Orders Received? Or Actions Taken?: expected value

## 2022-07-18 NOTE — Progress Notes (Addendum)
Spoke with cardiologist on call, Dr. Cathie Hoops. Received order for 40 mEq/L K+ infusion and repeat K+ level at 0200am. Requested K+ from pharmacy.

## 2022-07-18 NOTE — Progress Notes (Signed)
Increased frequency of ectopy. Cardiology paged

## 2022-07-18 NOTE — Progress Notes (Signed)
   07/18/22 1500  Spiritual Encounters  Type of Visit Initial  Care provided to: Patient  Referral source Code page  Reason for visit Code  OnCall Visit No   Ch responded to code STEMI. Pt is on her way to Cath lab. No family present. No follow up needed at this time.

## 2022-07-18 NOTE — Progress Notes (Signed)
Pt having increased ectopy and approximately 8 beat runs of V Tach. Cardiology paged

## 2022-07-19 ENCOUNTER — Encounter (HOSPITAL_COMMUNITY): Payer: Self-pay | Admitting: Cardiovascular Disease

## 2022-07-19 ENCOUNTER — Inpatient Hospital Stay (HOSPITAL_COMMUNITY): Payer: Medicaid Other

## 2022-07-19 ENCOUNTER — Other Ambulatory Visit (HOSPITAL_COMMUNITY): Payer: Self-pay

## 2022-07-19 DIAGNOSIS — E7800 Pure hypercholesterolemia, unspecified: Secondary | ICD-10-CM

## 2022-07-19 DIAGNOSIS — I2510 Atherosclerotic heart disease of native coronary artery without angina pectoris: Secondary | ICD-10-CM

## 2022-07-19 DIAGNOSIS — I4729 Other ventricular tachycardia: Secondary | ICD-10-CM

## 2022-07-19 LAB — LIPID PANEL
Cholesterol: 365 mg/dL — ABNORMAL HIGH (ref 0–200)
HDL: 52 mg/dL (ref >40)
LDL Cholesterol: 278 mg/dL — ABNORMAL HIGH (ref 0–99)
Total CHOL/HDL Ratio: 7 RATIO
Triglycerides: 173 mg/dL — ABNORMAL HIGH (ref <150)
VLDL: 35 mg/dL (ref 0–40)

## 2022-07-19 LAB — BASIC METABOLIC PANEL
Anion gap: 10 (ref 5–15)
BUN: 14 mg/dL (ref 8–23)
CO2: 20 mmol/L — ABNORMAL LOW (ref 22–32)
Calcium: 9.1 mg/dL (ref 8.9–10.3)
Chloride: 107 mmol/L (ref 98–111)
Creatinine, Ser: 1.06 mg/dL — ABNORMAL HIGH (ref 0.44–1.00)
GFR, Estimated: 59 mL/min — ABNORMAL LOW (ref >60)
Glucose, Bld: 108 mg/dL — ABNORMAL HIGH (ref 70–99)
Potassium: 4.3 mmol/L (ref 3.5–5.1)
Sodium: 137 mmol/L (ref 135–145)

## 2022-07-19 LAB — CBC
HCT: 37.4 % (ref 36.0–46.0)
Hemoglobin: 12.5 g/dL (ref 12.0–15.0)
MCH: 31.1 pg (ref 26.0–34.0)
MCHC: 33.4 g/dL (ref 30.0–36.0)
MCV: 93 fL (ref 80.0–100.0)
Platelets: 356 10*3/uL (ref 150–400)
RBC: 4.02 MIL/uL (ref 3.87–5.11)
RDW: 13.2 % (ref 11.5–15.5)
WBC: 10.1 10*3/uL (ref 4.0–10.5)
nRBC: 0 % (ref 0.0–0.2)

## 2022-07-19 LAB — MAGNESIUM: Magnesium: 2 mg/dL (ref 1.7–2.4)

## 2022-07-19 LAB — ECHOCARDIOGRAM COMPLETE
Area-P 1/2: 4.06 cm2
Height: 68 in
S' Lateral: 2.3 cm
Weight: 2398.6 oz

## 2022-07-19 MED ORDER — NITROGLYCERIN 0.4 MG SL SUBL
0.4000 mg | SUBLINGUAL_TABLET | SUBLINGUAL | 1 refills | Status: AC | PRN
  Filled 2022-07-19: qty 25, 7d supply, fill #0

## 2022-07-19 MED ORDER — CHLORHEXIDINE GLUCONATE CLOTH 2 % EX PADS
6.0000 | MEDICATED_PAD | Freq: Every day | CUTANEOUS | Status: DC
  Administered 2022-07-19: 6 via TOPICAL

## 2022-07-19 MED ORDER — ATORVASTATIN CALCIUM 80 MG PO TABS
80.0000 mg | ORAL_TABLET | Freq: Every day | ORAL | 3 refills | Status: DC
  Filled 2022-07-19: qty 30, 30d supply, fill #0

## 2022-07-19 MED ORDER — LOSARTAN POTASSIUM 50 MG PO TABS
50.0000 mg | ORAL_TABLET | Freq: Every day | ORAL | Status: DC
  Administered 2022-07-19 – 2022-07-20 (×2): 50 mg via ORAL
  Filled 2022-07-19 (×2): qty 1

## 2022-07-19 MED ORDER — ORAL CARE MOUTH RINSE: 15.0000 mL | OROMUCOSAL | Status: DC | PRN

## 2022-07-19 MED ORDER — ASPIRIN 81 MG PO CHEW
81.0000 mg | CHEWABLE_TABLET | Freq: Every day | ORAL | 3 refills | Status: DC
  Filled 2022-07-19: qty 30, 30d supply, fill #0

## 2022-07-19 MED ORDER — METOPROLOL TARTRATE 25 MG PO TABS
25.0000 mg | ORAL_TABLET | Freq: Two times a day (BID) | ORAL | 3 refills | Status: DC
  Filled 2022-07-19: qty 60, 30d supply, fill #0

## 2022-07-19 MED ORDER — LOSARTAN POTASSIUM 50 MG PO TABS
50.0000 mg | ORAL_TABLET | Freq: Every day | ORAL | 3 refills | Status: DC
  Filled 2022-07-19: qty 30, 30d supply, fill #0

## 2022-07-19 MED ORDER — TICAGRELOR 90 MG PO TABS
90.0000 mg | ORAL_TABLET | Freq: Two times a day (BID) | ORAL | 11 refills | Status: DC
  Filled 2022-07-19: qty 60, 30d supply, fill #0

## 2022-07-19 NOTE — Progress Notes (Signed)
CARDIAC REHAB PHASE I   PRE:  Rate/Rhythm: 79 NSR  BP:  Sitting: 137/80      SaO2: 97 RA  MODE:  Ambulation: 370 ft   AD:  None  POST:  Rate/Rhythm: 89 NSR  BP:  Sitting: 159/87      SaO2: 97 RA  Pt amb with standby assistance, pt denies CP and SOB during amb and was returned to room w/o complaint.   Pt walked well  Echo tech arrived during education-will resume after.   Marisa Fuller  9:45 AM 07/19/2022    Service time is from 0905 to [phone].

## 2022-07-19 NOTE — Progress Notes (Signed)
  Echocardiogram 2D Echocardiogram has been performed.  Marisa Fuller 07/19/2022, 10:28 AM

## 2022-07-19 NOTE — Progress Notes (Signed)
Pt was educated on stent card, stent location, Birlinta and ASA use, wt restrictions, no baths/daily wash-ups, s/s of infection, ex guidelines (progressive walking and resistance exercise), s/s to stop exercising, NTG use and calling 911, heart healthy diet, risk factors (HLD) and CRPII.   Pt understands they cannot do CRPII until they have insurance. Referral placed to cover metric.

## 2022-07-19 NOTE — TOC Transition Note (Signed)
Transitions of care medications (5) locked up in main pharmacy for weekend discharge.

## 2022-07-19 NOTE — Care Management (Addendum)
  Transition of Care [facility]) Screening Note   Patient Details  Name: Marisa Fuller Date of Birth: 1960/12/04   Transition of Care [facility]) CM/SW Contact:    Bethena Roys, RN Phone Number: 07/19/2022, 2:44 PM    Transition of Care Department [facility]) has reviewed the patient. Patient presented for chest pain and is currently without insurance. Case Manager spoke with the patient and she is agreeable to Case Manager scheduling an appointment with the Internal Medicine Clinic Information is on the AVS. Case Manager discussed with Pharmacists Janett Billow and she is assisting patient with the Brilinta Patient assistance Application. Medications have been sent to [place] completed and meds will be held in the [facility] for d/c. Case Manager will continue to monitor patient advancement through interdisciplinary progression rounds. If new patient transition needs arise, please place a TOC consult.

## 2022-07-19 NOTE — Progress Notes (Signed)
Rounding Note    Patient Name: Marisa Fuller Date of Encounter: 07/19/2022  [place] Cardiologist: Buford Dresser, MD   Subjective   Feels well this am. No chest pain. Denies dyspnea.   Inpatient Medications    Scheduled Meds:  aspirin  81 mg Oral Daily   atorvastatin  80 mg Oral Daily   Chlorhexidine Gluconate Cloth  6 each Topical Daily   metoprolol tartrate  25 mg Oral BID   sodium chloride flush  3 mL Intravenous Q12H   ticagrelor  90 mg Oral BID   Continuous Infusions:  sodium chloride 10 mL/hr at 07/19/22 0700   0.9 % NaCl with KCl 40 mEq / L 10 mL/hr at 07/19/22 0700   PRN Meds: sodium chloride, acetaminophen, ondansetron (ZOFRAN) IV, mouth rinse, oxyCODONE, sodium chloride flush   Vital Signs    Vitals:   07/19/22 0500 07/19/22 0600 07/19/22 0700 07/19/22 0745  BP: 136/78 125/69 137/76   Pulse: 64 69 73   Resp: 15 15 (!) 22   Temp:  98.3 F (36.8 C)    TempSrc:  Oral    SpO2: 97% 98% 95% 97%  Weight:  68 kg    Height:        Intake/Output Summary (Last 24 hours) at 07/19/2022 0811 Last data filed at 07/19/2022 0700 Gross per 24 hour  Intake 887.05 ml  Output 1550 ml  Net -662.95 ml      07/19/2022    6:00 AM 07/18/2022    5:00 PM 07/18/2022    4:00 PM  Last 3 Weights  Weight (lbs) 149 lb 14.6 oz 157 lb 3 oz 157 lb 3 oz  Weight (kg) 68 kg 71.3 kg 71.3 kg      Telemetry    NSR, multiple runs of NSVT on monitor. Longest 19 beats - Personally Reviewed  ECG    NSR rate 69. Normal.  - Personally Reviewed  Physical Exam   GEN: No acute distress.   Neck: No JVD Cardiac: RRR, no murmurs, rubs, or gallops. Radial site without hematoma.  Respiratory: Clear to auscultation bilaterally. GI: Soft, nontender, non-distended  MS: No edema; No deformity. Neuro:  Nonfocal  Psych: Normal affect   Labs    High Sensitivity Troponin:   Recent Labs  Lab 07/18/22 1521 07/18/22 1738  TROPONINIHS 27* 3,422*     Chemistry Recent Labs   Lab 07/18/22 1521 07/18/22 1738  NA 137  143  --   K 2.9*  3.1*  --   CL 108  108  --   CO2 20*  --   GLUCOSE 137*  147*  --   BUN 14  15  --   CREATININE 1.01*  0.80  --   CALCIUM 8.7*  --   MG  --  2.0  PROT 7.0  --   ALBUMIN 3.8  --   AST 21  --   ALT 13  --   ALKPHOS 90  --   BILITOT 0.6  --   GFRNONAA >60  --   ANIONGAP 9  --     Lipids No results for input(s): "CHOL", "TRIG", "HDL", "LABVLDL", "LDLCALC", "CHOLHDL" in the last 168 hours.  Hematology Recent Labs  Lab 07/18/22 1521  WBC 8.4  RBC 3.79*  HGB 12.0  12.2  HCT 33.8*  36.0  MCV 89.2  MCH 31.7  MCHC 35.5  RDW 12.7  PLT 367   Thyroid  Recent Labs  Lab 07/18/22 1521  TSH  0.988    BNPNo results for input(s): "BNP", "PROBNP" in the last 168 hours.  DDimer No results for input(s): "DDIMER" in the last 168 hours.   Radiology    CARDIAC CATHETERIZATION  Result Date: 07/18/2022   Prox RCA lesion is 30% stenosed.   Mid RCA lesion is 60% stenosed.   Mid RCA to Dist RCA lesion is 40% stenosed.   1st Mrg lesion is 99% stenosed.   Prox LAD lesion is 20% stenosed.   A drug-eluting stent was successfully placed using a STENT ONYX FRONTIER [id number].   Post intervention, there is a 0% residual stenosis.   There is mild left ventricular systolic dysfunction.   The left ventricular ejection fraction is 35-45% by visual estimate. 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 Diffuse mild disease in the proximal and distal vessel. Anteroapical hypokinesis of LV, LVEF around 40%. Recommendations: Will admit to the ICU. Continue Aggrastat drip for 2 hours. Continue DAPT with ASA and Brilinta for one year. Will start high intensity statin and low dose beta blocker. Echo tomorrow. I think medical management of the moderate stenosis in  the mid RCA is appropriate.    Cardiac Studies   CORONARY STENT INTERVENTION  LEFT HEART CATH AND CORONARY ANGIOGRAPHY   Conclusion      Prox RCA lesion is 30% stenosed.   Mid RCA lesion is 60% stenosed.   Mid RCA to Dist RCA lesion is 40% stenosed.   1st Mrg lesion is 99% stenosed.   Prox LAD lesion is 20% stenosed.   A drug-eluting stent was successfully placed using a STENT ONYX FRONTIER [id number].   Post intervention, there is a 0% residual stenosis.   There is mild left ventricular systolic dysfunction.   The left ventricular ejection fraction is 35-45% by visual estimate.   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 Diffuse mild disease in the proximal and distal vessel.  Anteroapical hypokinesis of LV, LVEF around 40%.    Recommendations: Will admit to the ICU. Continue Aggrastat drip for 2 hours. Continue DAPT with ASA and Brilinta for one year. Will start high intensity statin and low dose beta blocker. Echo tomorrow. I think medical management of the moderate stenosis in the mid RCA is appropriate.    Coronary Diagrams  Diagnostic Dominance: Right  Intervention    Implants   Patient Profile     62 y.o. female with history of untreated HLD presents with acute lateral STEMI  Assessment & Plan    Acute lateral STEMI. Ecg with ST elevation laterally. Troponin peak 3422. Cardiac cath with occluded LCx s/p emergent PCI with DES. She has moderate mid RCA disease. Will continue DAPT for one year. High dose statin. Beta blocker. May add ACEi/ARB if renal function stable. Will assess Echo today HLD LDL > 190 in the past. Needs aggressive lipid lowering therapy NSVT. Post infarct. Replete potassium. Beta blocker. Will watch in unit today Hypokalemia. Replete.   For questions or  updates, please contact [place] Please consult [email or web address] for contact info under        Signed, Lailanie Hasley [place], MD  07/19/2022, 8:11 AM

## 2022-07-19 NOTE — Progress Notes (Signed)
   Discharge prescriptions sent to [facility] so that they will be available if patient is discharged over the weekend. I also placed ambulatory referral to Lipid disorder clinic as patient has LDL of [place], PA-C 07/19/2022 2:32 PM

## 2022-07-19 NOTE — Progress Notes (Signed)
Patient discharge medications delivered to bedside. Patient and family member educated on each medication and FDA-labeled indications/mechanism of action. Patient reiterated on importance of strict adherence to medication regimen with each medication and appropriate times to contact cardiologist for questions or concerns with side-effects, refills or dosing. Multiple opportunities provided for questions or concerns with all being answered and none remaining at this time. Patient assured that this information will be re-educated prior to discharge and medication reconciliation will be provided with recent dosing times while hospitalized and next dosing due time once discharged.

## 2022-07-20 DIAGNOSIS — I10 Essential (primary) hypertension: Secondary | ICD-10-CM | POA: Insufficient documentation

## 2022-07-20 DIAGNOSIS — I2510 Atherosclerotic heart disease of native coronary artery without angina pectoris: Secondary | ICD-10-CM | POA: Insufficient documentation

## 2022-07-20 LAB — BASIC METABOLIC PANEL
Anion gap: 7 (ref 5–15)
BUN: 19 mg/dL (ref 8–23)
CO2: 24 mmol/L (ref 22–32)
Calcium: 8.9 mg/dL (ref 8.9–10.3)
Chloride: 105 mmol/L (ref 98–111)
Creatinine, Ser: 1.04 mg/dL — ABNORMAL HIGH (ref 0.44–1.00)
GFR, Estimated: >60 mL/min (ref >60)
Glucose, Bld: 109 mg/dL — ABNORMAL HIGH (ref 70–99)
Potassium: 3.7 mmol/L (ref 3.5–5.1)
Sodium: 136 mmol/L (ref 135–145)

## 2022-07-20 LAB — LIPID PANEL
Cholesterol: 317 mg/dL — ABNORMAL HIGH (ref 0–200)
HDL: 45 mg/dL (ref >40)
LDL Cholesterol: 232 mg/dL — ABNORMAL HIGH (ref 0–99)
Total CHOL/HDL Ratio: 7 RATIO
Triglycerides: 202 mg/dL — ABNORMAL HIGH (ref <150)
VLDL: 40 mg/dL (ref 0–40)

## 2022-07-20 LAB — LIPOPROTEIN A (LPA): Lipoprotein (a): 169.2 nmol/L — ABNORMAL HIGH (ref <75.0)

## 2022-07-20 MED ORDER — ASPIRIN 81 MG PO TBEC: 81.0000 mg | DELAYED_RELEASE_TABLET | Freq: Every day | ORAL | Status: AC

## 2022-07-20 NOTE — Discharge Summary (Addendum)
Discharge Summary    Patient ID: Marisa Fuller MRN: [id number]; DOB: [birth year]  Admit date: 07/18/2022 Discharge date: 07/20/2022  PCP:  Pcp, No   [facility] Providers Cardiologist:  Jodelle Red, MD   Discharge Diagnoses    Principal Problem:   Acute ST elevation myocardial infarction (STEMI) of inferolateral wall [facility]) Active Problems:   Hypercholesterolemia with LDL greater than 190 mg/dL   ST elevation myocardial infarction (STEMI) of inferolateral wall (HCC)   CAD (coronary artery disease)   Hypertension    Diagnostic Studies/Procedures    Left heart cath 07/18/22:   Prox RCA lesion is 30% stenosed.   Mid RCA lesion is 60% stenosed.   Mid RCA to Dist RCA lesion is 40% stenosed.   1st Mrg lesion is 99% stenosed.   Prox LAD lesion is 20% stenosed.   A drug-eluting stent was successfully placed using a STENT ONYX FRONTIER [id number].   Post intervention, there is a 0% residual stenosis.   There is mild left ventricular systolic dysfunction.   The left ventricular ejection fraction is 35-45% by visual estimate.   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 Diffuse mild disease in the proximal and distal vessel.  Anteroapical hypokinesis of LV, LVEF around 40%.    Recommendations: Will admit to the ICU. Continue Aggrastat drip for 2 hours. Continue DAPT with ASA and Brilinta for one year. Will start high intensity statin and low dose beta blocker. Echo tomorrow. I think medical management of the moderate stenosis in the mid RCA is appropriate.   _____________   Echo 07/19/22:  1. Sub optimal images due to breast implants.   2. Mildly abnormal strain with some apical sparing . Left ventricular  ejection fraction, by estimation, is 60 to 65%. The left ventricle has   normal function. The left ventricle has no regional wall motion  abnormalities. There is mild left ventricular  hypertrophy. Left ventricular diastolic parameters were normal. The  average left ventricular global longitudinal strain is -13.7 %. The global  longitudinal strain is abnormal.   3. Right ventricular systolic function is normal. The right ventricular  size is normal.   4. The mitral valve is abnormal. Trivial mitral valve regurgitation. No  evidence of mitral stenosis.   5. The aortic valve is tricuspid. Aortic valve regurgitation is not  visualized. No aortic stenosis is present.   6. The inferior vena cava is normal in size with greater than 50%  respiratory variability, suggesting right atrial pressure of 3 mmHg.     History of Present Illness     Marisa Fuller is a 62 y.o. 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   Ms. Jefcoat previously saw Dr. Cristal Deer in 2021 for palpitations and chest discomfort. Monitor showed NSR with only 1 - 12 beat run SVT associated with symptoms, but otherwise the remaining 25 triggers were NSR. Coronary CT was ordered but not pursued by patient due to cost per last note with pharmD which was eval for suspected famililal HLD with LDL 205. She had been recommended to start statin and possible adjunctive therapy but did not commit to starting, overwhelmed at the time. She has not followed up since that time. Of  note she was seen at urgent care 06/2021 with right hip pain with LE venous duplex neg for DVT.   On 07/18/22, she developed chest pain shortly after 2pm and EMS was called. 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 This was associated with SOB, nausea, and sweating. Initial EKG was concerning for subtle ST elevation in leads III, avF, V5-V6 so code STEMI was paged out. 2nd EKG  en route showed improvement in ST elevation, then repeats demonstrated more severe diffuse ST elevation in I, II, avF, V4-V6 with ST depression/TWI in V2-V3 - max 24mm STE in V6. EMS administered 324mg  ASA and 1 SL NTG. Upon arrival to the ER she is not very talkative. She is A+Ox3 but answers in brief one or two word replies. She notes a headache after SL NTG. She continues to have ongoing CP. Last BP with EMS 100/50, pulse 93bpm. She reports she does not take any medicines, has no allergies, has no prior hx of TIA/stroke or bleeding, no tobacco/ETOH/illicit drug use or fam hx of CAD. Labs are pending. She reports a friend is calling her husband to notify him of the events.   Hospital Course     Consultants: none   Acute lateral STEMI  CAD HST 11 --> 3422 She was taken emergently to [facility] for heart catheterization. LHC showed thrombotic sub total occlusion of hte mid LCS treated successfully with PTCA/DES x 1 2.75 x 18 mm to OM1 branch. Residual nonobstructive disease in the pRCA 30%, midRCA 60%, and pLAD 20% treated medically. LVEF 35-45% at the time of heart cath with LVEDP 27 mmHg. Follow up echocardiogram the next day with LVEF 60-65%, no RWMA, normal RV, and no significant valvular disease.  She was treated with DAPT with ASA and Brilinta. Cardiac rehab complicated by lack of insurance.    NSVT Hypokalemia  Post procedure course complicated by NSVT and hypokalemia. Potassium has been replaced, stable BMP today.    Suspect familial hyperlipidemia 07/20/2022: Cholesterol 317; HDL 45; LDL Cholesterol 232; Triglycerides 202; VLDL 40 Started on 80 mg lipitor, will likely need additional therapies to achieve LDL goal < 70.  Repeat lipids in 6 weeks.   Hypertension Losartan 50 mg daily, metoprolol 25 mg BID   Pt seen and examined by Dr. Lovena Le and deemed stable for discharge. Follow up has been arranged.    Did the patient have an acute coronary syndrome (MI, NSTEMI, STEMI, etc) this  admission?:  Yes                               AHA/ACC Clinical Performance & Quality Measures: Aspirin prescribed? - Yes ADP Receptor Inhibitor (Plavix/Clopidogrel, Brilinta/Ticagrelor or Effient/Prasugrel) prescribed (includes medically managed patients)? - Yes Beta Blocker prescribed? - Yes High Intensity Statin (Lipitor 40-80mg  or Crestor 20-40mg ) prescribed? - Yes EF assessed during THIS hospitalization? - Yes For EF <40%, was ACEI/ARB prescribed? - Yes For EF <40%, Aldosterone Antagonist (Spironolactone or Eplerenone) prescribed? - Not Applicable (EF >/= 13%) Cardiac Rehab Phase II ordered (including medically managed patients)? - Yes       The patient will be scheduled for a TOC follow up appointment in 7-14 days.  A message has been sent to the [facility] and Scheduling Pool at the office where the patient should be seen for follow up.  _____________  Discharge Vitals Blood pressure 127/66, pulse 64, temperature 97.9 F (36.6 C), temperature  source Oral, resp. rate 16, height 5\' 8"  (1.727 m), weight 68 kg, SpO2 97 %.  Filed Weights   07/18/22 1600 07/18/22 1700 07/19/22 0600  Weight: 71.3 kg 71.3 kg 68 kg   PE Well appearing middle aged woman, NAD Lungs: clear CV: RRR Ext: no edema. Neuro: non-focal Labs & Radiologic Studies    CBC Recent Labs    07/18/22 1521 07/19/22 0724  WBC 8.4 10.1  NEUTROABS 4.6  --   HGB 12.0  12.2 12.5  HCT 33.8*  36.0 37.4  MCV 89.2 93.0  PLT 367 356   Basic Metabolic Panel Recent Labs    09/17/22 1738 07/19/22 0724 07/20/22 0658  NA  --  137 136  K  --  4.3 3.7  CL  --  107 105  CO2  --  20* 24  GLUCOSE  --  108* 109*  BUN  --  14 19  CREATININE  --  1.06* 1.04*  CALCIUM  --  9.1 8.9  MG 2.0 2.0  --    Liver Function Tests Recent Labs    07/18/22 1521  AST 21  ALT 13  ALKPHOS 90  BILITOT 0.6  PROT 7.0  ALBUMIN 3.8   No results for input(s): "LIPASE", "AMYLASE" in the last 72 hours. High Sensitivity Troponin:    Recent Labs  Lab 07/18/22 1521 07/18/22 1738  TROPONINIHS 27* 3,422*    BNP Invalid input(s): "POCBNP" D-Dimer No results for input(s): "DDIMER" in the last 72 hours. Hemoglobin A1C Recent Labs    07/18/22 1516  HGBA1C 6.3*   Fasting Lipid Panel Recent Labs    07/20/22 0658  CHOL 317*  HDL 45  LDLCALC 232*  TRIG 202*  CHOLHDL 7.0   Thyroid Function Tests Recent Labs    07/18/22 1521  TSH 0.988   _____________  ECHOCARDIOGRAM COMPLETE  Result Date: 07/19/2022    ECHOCARDIOGRAM REPORT   Patient Name:   ANDY MOYE Date of Exam: 07/19/2022 Medical Rec #:  09/17/2022     Height:       68.0 in Accession #:    [id number]    Weight:       149.9 lb Date of Birth:  16-Mar-1961    BSA:          1.808 m Patient Age:    61 years      BP:           129/79 mmHg Patient Gender: F             HR:           73 bpm. Exam Location:  Inpatient Procedure: 2D Echo Indications:    cad of native vessel  History:        Patient has no prior history of Echocardiogram examinations.                 Risk Factors:Dyslipidemia.  Sonographer:    04/15/1961 RDCS Referring Phys: 40 CHRISTOPHER D MCALHANY  Sonographer Comments: Global longitudinal strain was attempted. IMPRESSIONS  1. Sub optimal images due to breast implants.  2. Mildly abnormal strain with some apical sparing . Left ventricular ejection fraction, by estimation, is 60 to 65%. The left ventricle has normal function. The left ventricle has no regional wall motion abnormalities. There is mild left ventricular hypertrophy. Left ventricular diastolic parameters were normal. The average left ventricular global longitudinal strain is -13.7 %. The global longitudinal strain is abnormal.  3. Right ventricular systolic function is  normal. The right ventricular size is normal.  4. The mitral valve is abnormal. Trivial mitral valve regurgitation. No evidence of mitral stenosis.  5. The aortic valve is tricuspid. Aortic valve regurgitation is not  visualized. No aortic stenosis is present.  6. The inferior vena cava is normal in size with greater than 50% respiratory variability, suggesting right atrial pressure of 3 mmHg. FINDINGS  Left Ventricle: Mildly abnormal strain with some apical sparing. Left ventricular ejection fraction, by estimation, is 60 to 65%. The left ventricle has normal function. The left ventricle has no regional wall motion abnormalities. The average left ventricular global longitudinal strain is -13.7 %. The global longitudinal strain is abnormal. The left ventricular internal cavity size was normal in size. There is mild left ventricular hypertrophy. Left ventricular diastolic parameters were normal. Right Ventricle: The right ventricular size is normal. No increase in right ventricular wall thickness. Right ventricular systolic function is normal. Left Atrium: Left atrial size was normal in size. Right Atrium: Right atrial size was normal in size. Pericardium: Trivial pericardial effusion is present. The pericardial effusion is anterior to the right ventricle. Mitral Valve: The mitral valve is abnormal. There is mild thickening of the mitral valve leaflet(s). Trivial mitral valve regurgitation. No evidence of mitral valve stenosis. Tricuspid Valve: The tricuspid valve is normal in structure. Tricuspid valve regurgitation is trivial. No evidence of tricuspid stenosis. Aortic Valve: The aortic valve is tricuspid. Aortic valve regurgitation is not visualized. No aortic stenosis is present. Pulmonic Valve: The pulmonic valve was normal in structure. Pulmonic valve regurgitation is not visualized. No evidence of pulmonic stenosis. Aorta: The aortic root is normal in size and structure. Venous: The inferior vena cava is normal in size with greater than 50% respiratory variability, suggesting right atrial pressure of 3 mmHg. IAS/Shunts: No atrial level shunt detected by color flow Doppler. Additional Comments: Sub optimal images due to  breast implants.  LEFT VENTRICLE PLAX 2D LVIDd:         3.70 cm   Diastology LVIDs:         2.30 cm   LV e' medial:    8.05 cm/s LV PW:         1.00 cm   LV E/e' medial:  9.4 LV IVS:        1.10 cm   LV e' lateral:   9.57 cm/s LVOT diam:     1.90 cm   LV E/e' lateral: 7.9 LV SV:         58 LV SV Index:   32        2D Longitudinal Strain LVOT Area:     2.84 cm  2D Strain GLS Avg:     -13.7 %  RIGHT VENTRICLE             IVC RV Basal diam:  2.80 cm     IVC diam: 1.70 cm RV S prime:     11.10 cm/s TAPSE (M-mode): 2.0 cm LEFT ATRIUM             Index        RIGHT ATRIUM           Index LA diam:        3.30 cm 1.83 cm/m   RA Area:     10.50 cm LA Vol (A2C):   45.3 ml 25.05 ml/m  RA Volume:   20.40 ml  11.28 ml/m LA Vol (A4C):   20.3 ml 11.23 ml/m LA Biplane Vol: 32.2  ml 17.81 ml/m  AORTIC VALVE LVOT Vmax:   88.00 cm/s LVOT Vmean:  59.100 cm/s LVOT VTI:    0.205 m  AORTA Ao Root diam: 2.40 cm Ao Asc diam:  2.60 cm MITRAL VALVE MV Area (PHT): 4.06 cm    SHUNTS MV Decel Time: 187 msec    Systemic VTI:  0.20 m MV E velocity: 75.40 cm/s  Systemic Diam: 1.90 cm MV A velocity: 70.80 cm/s MV E/A ratio:  1.06 Charlton Haws MD Electronically signed by Charlton Haws MD Signature Date/Time: 07/19/2022/10:51:30 AM    Final    CARDIAC CATHETERIZATION  Result Date: 07/18/2022   Prox RCA lesion is 30% stenosed.   Mid RCA lesion is 60% stenosed.   Mid RCA to Dist RCA lesion is 40% stenosed.   1st Mrg lesion is 99% stenosed.   Prox LAD lesion is 20% stenosed.   A drug-eluting stent was successfully placed using a STENT ONYX FRONTIER [id number].   Post intervention, there is a 0% residual stenosis.   There is mild left ventricular systolic dysfunction.   The left ventricular ejection fraction is 35-45% by visual estimate. Acute inferolateral STEMI secondary to thrombotic sub-total of the mid Circumflex Successful PTCA/DES x 1 first obtuse marginal branch Mild non-obstructive disease in the LAD Large dominant RCA with moderate stenosis  in the mid vessel. Diffuse mild disease in the proximal and distal vessel. Anteroapical hypokinesis of LV, LVEF around 40%. Recommendations: Will admit to the ICU. Continue Aggrastat drip for 2 hours. Continue DAPT with ASA and Brilinta for one year. Will start high intensity statin and low dose beta blocker. Echo tomorrow. I think medical management of the moderate stenosis in the mid RCA is appropriate.   Disposition   Pt is being discharged home today in good condition.  Follow-up Plans & Appointments     Follow-up Information     Alver Sorrow, NP Follow up on 07/26/2022.   Specialty: Cardiology Why: 8:50 am for hospital follow up Contact information: [address] [place] [address] [phone]                Discharge Instructions     AMB Referral to Advanced Lipid Disorders Clinic   Complete by: As directed    Reason for referral: Patients with LDL>190 mg/dL   Internal Lipid Clinic Referral Scheduling  Internal lipid clinic referrals are providers within [facility], who wish to refer established patients for routine management (help in starting PCSK9 inhibitor therapy) or advanced therapies.  Internal MD referral criteria:              1. All patients with LDL>190 mg/dL  2. All patients with Triglycerides >500 mg/dL  3. Patients with suspected or confirmed heterozygous familial hyperlipidemia (HeFH) or homozygous familial hyperlipidemia (HoFH)  4. Patients with family history of suspicious for genetic dyslipidemia desiring genetic testing  5. Patients refractory to standard guideline based therapy  6. Patients with statin intolerance (failed 2 statins, one of which must be a high potency statin)  7. Patients who the provider desires to be seen by MD   Internal PharmD referral criteria:   1. Follow-up patients for medication management  2. Follow-up for compliance monitoring  3. Patients for drug education  4. Patients with statin intolerance  5. PCSK9  inhibitor education and prior authorization approvals  6. Patients with triglycerides <500 mg/dL  External Lipid Clinic Referral  External lipid clinic referrals are for providers outside of [facility], considered new clinic patients -  automatically routed to MD schedule   Amb Referral to Cardiac Rehabilitation   Complete by: As directed    Diagnosis:  Coronary Stents STEMI     After initial evaluation and assessments completed: Virtual Based Care may be provided alone or in conjunction with Phase 2 Cardiac Rehab based on patient barriers.: Yes   Intensive Cardiac Rehabilitation (ICR) [place] location only OR Traditional Cardiac Rehabilitation (TCR) *If criteria for ICR are not met will enroll in TCR [facility] only): Yes   Diet - low sodium heart healthy   Complete by: As directed    Discharge instructions   Complete by: As directed    No driving for 2 days. No lifting over 5 lbs for 1 week. No sexual activity for 1 week. You may return to work after follow up appointment. Keep procedure site clean & dry. If you notice increased pain, swelling, bleeding or pus, call/return!  You may shower, but no soaking baths/hot tubs/pools for 1 week.   Increase activity slowly   Complete by: As directed         Discharge Medications   Allergies as of 07/20/2022   No Known Allergies      Medication List     TAKE these medications    aspirin EC 81 MG tablet Take 1 tablet (81 mg total) by mouth daily. Swallow whole.   atorvastatin 80 MG tablet Commonly known as: LIPITOR Take 1 tablet (80 mg total) by mouth daily.   Brilinta 90 MG Tabs tablet Generic drug: ticagrelor Take 1 tablet (90 mg total) by mouth 2 (two) times daily.   cetirizine 10 MG tablet Commonly known as: ZYRTEC Take 10 mg by mouth at bedtime.   losartan 50 MG tablet Commonly known as: COZAAR Take 1 tablet (50 mg total) by mouth daily.   metoprolol tartrate 25 MG tablet Commonly known as: LOPRESSOR Take 1 tablet (25 mg  total) by mouth 2 (two) times daily.   MULTIVITAMIN WOMEN PO Take 1 tablet by mouth daily.   nitroGLYCERIN 0.4 MG SL tablet Commonly known as: Nitrostat Place 1 tablet (0.4 mg total) under the tongue every 5 (five) minutes as needed for chest pain.          Outstanding Labs/Studies   Lipids - lipid clinic referral  Duration of Discharge Encounter   Greater than 30 minutes including physician time.  Signed, Ledora Bottcher, PA 07/20/2022, 10:10 AM  Cardiology Attending  Patient seen and examined. Agree with above. The patient is chest pain free, vitals are stable and she is ok for DC with followup as arranged above.   Carleene Overlie Iisha Soyars,MD

## 2022-07-20 NOTE — Progress Notes (Signed)
Followed up with patient and reviewed prior education taught by EP yesterday. Reviewed Cardiac Rehab Phase 2, which patient currently declines, but informed patient we do have a wait list so I will refer her and she may changed her mind, risk factors, exercise guidelines, chest pain and nitro use, MI book reviewed, post cath restrictions, and nutrition. Pt understands without assistance. Pt ready to be discharged my primary RN.

## 2022-07-24 ENCOUNTER — Encounter: Payer: Self-pay | Admitting: *Deleted

## 2022-07-24 DIAGNOSIS — I2119 ST elevation (STEMI) myocardial infarction involving other coronary artery of inferior wall: Secondary | ICD-10-CM

## 2022-07-24 DIAGNOSIS — Z006 Encounter for examination for normal comparison and control in clinical research program: Secondary | ICD-10-CM

## 2022-07-24 MED ORDER — STUDY - EVOLVE-MI - EVOLOCUMAB (REPATHA) 140 MG/ML SQ INJECTION (PI-STUCKEY): 140.0000 mg | INJECTION | SUBCUTANEOUS | 0 refills | Status: DC

## 2022-07-24 MED ORDER — STUDY - EVOLVE-MI - EVOLOCUMAB (REPATHA) 140 MG/ML SQ INJECTION (PI-STUCKEY)
140.0000 mg | INJECTION | SUBCUTANEOUS | Status: DC
  Administered 2022-07-24: 140 mg via SUBCUTANEOUS
  Filled 2022-07-24: qty 1

## 2022-07-24 NOTE — Research (Signed)
EVOLVE  Informed Consent   Subject Name: Marisa Fuller  Subject met inclusion and exclusion criteria.  The informed consent form, study requirements and expectations were reviewed with the subject and questions and concerns were addressed prior to the signing of the consent form.  The subject verbalized understanding of the trial requirements.  The subject agreed to participate in the EVOLVE  trial and signed the informed consent at 12:05  on 07-24-2022.  The informed consent was obtained prior to performance of any protocol-specific procedures for the subject.  A copy of the signed informed consent was given to the subject and a copy was placed in the subject's medical record.   [name]      Protocol # [id number]   Subject ID#          [id number]                     DAY 1 Date:        24-Jul-2022  Randomization:   Geographic Region:    [place]  Randomization Date:     24-Jul-2022  Randomization Time:  12:13     Treatment type Assigned   [x]$  Treatment                                   []$   Control          Protocol # [id number]  Subject Initial ID#:       [id number]                           Age in years: 72  *Demographics are found in the [place] EMR source.   Protocol Version: v2.00 [id number]  Were all Eligibility Criteria Met?  [x]$  Yes  []$   No  Screening Visit Date:                24-Jul-2022      Protocol # [id number]   Subject ID#                               DAY 1 Date:        dd-mmm-yyyy     Initial Study Treatment- Evolocumab Self-administration  Training for Evolocumab Self-Administration Completed?  [x]$  YES  []$   NO  Training for Evolocumab Self-Administration Date:   24-Jul-2022  Evolocumab Administered?  [x]$  YES  []$   NO  Evolocumab Start Date: 24-Jul-2022  Lot Number of Initial Dose Administered: [id number]  Additional Lot Number Distributed to Subject: [id number]  Additional Lot Number Distributed to Subject: [id number]  Additional Lot Number  Distributed to Subject: [id number]  Additional Lot Number Distributed to Subject: [id number]  Additional Lot Number Distributed to Subject: - [id number]

## 2022-07-25 ENCOUNTER — Encounter (HOSPITAL_BASED_OUTPATIENT_CLINIC_OR_DEPARTMENT_OTHER): Payer: Self-pay | Admitting: Cardiology

## 2022-07-25 NOTE — Progress Notes (Signed)
Cardiology Office Note:    Date:  07/26/2022   ID:  Marisa Fuller, DOB [birth year], MRN [id number]  PCP:  Kathyrn Lass   [place] Providers Cardiologist:  Buford Dresser, MD     Referring MD: No ref. provider found   Chief Complaint: Hospital follow up  History of Present Illness:    Marisa Fuller is a 62 y.o. female with a hx of CAD (STEMI 07/18/2022, DES to first obtuse marginal branch), HLD, hypertension, sickle cell trait, GERD, DVT (2013 following a leg injury), ICM (EF 35-45% during cath, repeat echo following day 60-65%) . Last seen while admitted.  Initially evaluated with our office by Dr. Harrell Gave in 2021 for palpitations and chest discomfort, coronary CT was ordered but patient was not able to complete due to cost.  She was lost to follow-up after that.  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 Associated with shortness of breath, nausea, diaphoresis.  Initial EKG she was concerning for subtle ST elevations in leads III, aVF, V4 through 5.  She was taken to the Cath Lab, LHC showed thrombotic subtotal occlusion of the mid LCS treated successfully with DES to OM1 branch.  Recommendations for DAPT with aspirin and Brilinta x 1 year, she was started on Lipitor 80 mg once daily, metoprolol to tartrate 25 mg twice daily, losartan 50 mg once daily, Brilinta 90 mg twice daily, aspirin 81 mg once daily.  She was also referred to the lipid clinic for LDL of 278.  She met criteria for the Evolve-MI trial and will start on Repatha.  She presents today independently. Reports only mild soreness in her right arm and cath site but bruising is improved.  She has had some slight tenderness to her chest wall but no exertional chest pain. She notes feeling tired. Wakes up feeling well then fatigues easily.  Discussed gradual return to activity.  She is walking to her mailbox for exercise as well as riding on stationary bike.  Reviewed  hospitalization, medical therapy for coronary disease, lifestyle changes for coronary disease in depth.  Past Medical History:  Diagnosis Date   Allergy    Anemia    sickle cell trait   CAD (coronary artery disease) 07/18/2022   DES to first obtuse marginal   Clotting disorder (HCC)    DVT (deep venous thrombosis) (HCC)    Remote DVT per notes   GERD (gastroesophageal reflux disease)    Hyperlipidemia    Hypertension    Sickle cell anemia ([place])    trait    Past Surgical History:  Procedure Laterality Date   BREAST SURGERY     CORONARY STENT INTERVENTION N/A 07/18/2022   Procedure: CORONARY STENT INTERVENTION;  Surgeon: Burnell Blanks, MD;  Location: [facility];  Service: Cardiovascular;  Laterality: N/A;   COSMETIC SURGERY     LEFT HEART CATH AND CORONARY ANGIOGRAPHY N/A 07/18/2022   Procedure: LEFT HEART CATH AND CORONARY ANGIOGRAPHY;  Surgeon: Burnell Blanks, MD;  Location: [facility];  Service: Cardiovascular;  Laterality: N/A;   TUBAL LIGATION      Current Medications: Current Meds  Medication Sig   aspirin EC 81 MG tablet Take 1 tablet (81 mg total) by mouth daily. Swallow whole.   cetirizine (ZYRTEC) 10 MG tablet Take 10 mg by mouth at bedtime.   clopidogrel (PLAVIX) 75 MG tablet Take 1 tablet (75 mg total) by mouth daily. Take one tablet daily  IF you run out of Brilinta.   metoprolol succinate (TOPROL XL) 25 MG 24 hr tablet Take 1 tablet (25 mg total) by mouth daily.   Multiple Vitamins-Minerals (MULTIVITAMIN WOMEN PO) Take 1 tablet by mouth daily.   nitroGLYCERIN (NITROSTAT) 0.4 MG SL tablet Place 1 tablet (0.4 mg total) under the tongue every 5 (five) minutes as needed for chest pain.   Study - EVOLVE-MI - evolocumab (REPATHA) 140 mg/mL SQ injection (PI-Stuckey) Inject 1 mL (140 mg total) into the skin every 14 (fourteen) days. Inject 1 mL (140 mg) subcutaneously into abdomen, thigh, or upper arm every 14 days. Rotate injection sites and  do not inject into areas where skin is tender, bruised, or red. Please contact [facility] for any questions or concerns regarding this medication.   ticagrelor (BRILINTA) 90 MG TABS tablet Take 1 tablet (90 mg total) by mouth 2 (two) times daily.   [DISCONTINUED] atorvastatin (LIPITOR) 80 MG tablet Take 1 tablet (80 mg total) by mouth daily.   [DISCONTINUED] losartan (COZAAR) 50 MG tablet Take 1 tablet (50 mg total) by mouth daily.   [DISCONTINUED] metoprolol tartrate (LOPRESSOR) 25 MG tablet Take 1 tablet (25 mg total) by mouth 2 (two) times daily.   Current Facility-Administered Medications for the 07/26/22 encounter (Office Visit) with Loel Dubonnet, NP  Medication   Study - EVOLVE-MI - evolocumab (REPATHA) 140 mg/mL SQ injection (PI-Stuckey)     Allergies:   Patient has no known allergies.   Social History   Socioeconomic History   Marital status: Single    Spouse name: Not on file   Number of children: Not on file   Years of education: Not on file   Highest education level: Not on file  Occupational History   Not on file  Tobacco Use   Smoking status: Former   Smokeless tobacco: Never  Substance and Sexual Activity   Alcohol use: Yes   Drug use: No   Sexual activity: Not on file  Other Topics Concern   Not on file  Social History Narrative   Not on file   Social Determinants of Health   Financial Resource Strain: Not on file  Food Insecurity: No Food Insecurity (07/18/2022)   Hunger Vital Sign    Worried About Running Out of Food in the Last Year: Never true    Ran Out of Food in the Last Year: Never true  Transportation Needs: No Transportation Needs (07/18/2022)   PRAPARE - Hydrologist (Medical): No    Lack of Transportation (Non-Medical): No  Physical Activity: Not on file  Stress: Not on file  Social Connections: Not on file     Family History: The patient's family history includes ALS in her mother; Cancer in  her sister; HIV/AIDS in her sister; Seizures in her mother.  ROS:   Please see the history of present illness.     All other systems reviewed and are negative.  EKGs/Labs/Other Studies Reviewed:    The following studies were reviewed today:  07/18/2022 left heart cath -     Prox RCA lesion is 30% stenosed.   Mid RCA lesion is 60% stenosed.   Mid RCA to Dist RCA lesion is 40% stenosed.   1st Mrg lesion is 99% stenosed.   Prox LAD lesion is 20% stenosed.   A drug-eluting stent was successfully placed using a STENT ONYX FRONTIER [id number].   Post intervention, there is a 0% residual stenosis.   There  is mild left ventricular systolic dysfunction.   The left ventricular ejection fraction is 35-45% by visual estimate.   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 Diffuse mild disease in the proximal and distal vessel.  Anteroapical hypokinesis of LV, LVEF around 40%.   Continue DAPT with ASA and Brilinta for one year. Will start high intensity statin and low dose beta blocker.   Medical management of the moderate stenosis in the mid RCA is appropriate.     07/19/2022 echo complete -    1. Sub optimal images due to breast implants.   2. Mildly abnormal strain with some apical sparing . Left ventricular  ejection fraction, by estimation, is 60 to 65%. The left ventricle has  normal function. The left ventricle has no regional wall motion  abnormalities. There is mild left ventricular  hypertrophy. Left ventricular diastolic parameters were normal. The  average left ventricular global longitudinal strain is -13.7 %. The global  longitudinal strain is abnormal.   3. Right ventricular systolic function is normal. The right ventricular  size is normal.   4. The mitral valve is abnormal. Trivial  mitral valve regurgitation. No  evidence of mitral stenosis.   5. The aortic valve is tricuspid. Aortic valve regurgitation is not  visualized. No aortic stenosis is present.   6. The inferior vena cava is normal in size with greater than 50%  respiratory variability, suggesting right atrial pressure of 3 mmHg.   EKG:  EKG is ordered today.  The ekg ordered today demonstrates NSR 63 bpm with no acute ST/changes.  Recent Labs: 07/18/2022: ALT 13; TSH 0.988 07/19/2022: Hemoglobin 12.5; Magnesium 2.0; Platelets 356 07/20/2022: BUN 19; Creatinine, Ser 1.04; Potassium 3.7; Sodium 136  Recent Lipid Panel    Component Value Date/Time   CHOL 317 (H) 07/20/2022 0658   CHOL 291 (H) 08/31/2019 1520   TRIG 202 (H) 07/20/2022 0658   HDL 45 07/20/2022 0658   HDL 53 08/31/2019 1520   CHOLHDL 7.0 07/20/2022 0658   VLDL 40 07/20/2022 0658   LDLCALC 232 (H) 07/20/2022 0658   LDLCALC 205 (H) 08/31/2019 1520    Physical Exam:    VS:  BP 126/60   Pulse 63   Ht 5' 8"$  (1.727 m)   Wt 152 lb (68.9 kg)   BMI 23.11 kg/m     Wt Readings from Last 3 Encounters:  07/26/22 152 lb (68.9 kg)  07/19/22 149 lb 14.6 oz (68 kg)  11/22/19 146 lb (66.2 kg)     GEN:  Well nourished, well developed in no acute distress HEENT: Normal NECK: No JVD; No carotid bruits LYMPHATICS: No lymphadenopathy CARDIAC: RRR, no murmurs, rubs, gallops RESPIRATORY:  Clear to auscultation without rales, wheezing or rhonchi  ABDOMEN: Soft, non-tender, non-distended MUSCULOSKELETAL:  No edema; No deformity  SKIN: Warm and dry.  Right radial catheterization site with no hematoma.  There is a 2"x1" area of ecchymosis proximal to the catheterization site NEUROLOGIC:  Alert and oriented x 3 PSYCHIATRIC:  Normal affect   ASSESSMENT:    1. Coronary artery disease of native artery of native heart with stable angina pectoris ([place])   2. Essential hypertension   3. Hyperlipidemia LDL goal <70   4. Does not have health insurance     PLAN:    In order of problems listed above:  CAD/s/p STEMI - Stable with no anginal symptoms. No indication for ischemic evaluation.  EKG today NSR with no acute ST/T wave changes.  GDMT of DAPT Aspirin/Brilinta, beta-blocker, atorvastatin, Repatha. Will need DAPT x 1 year. [place] patient [place]. Applying for Medicaid, as below. Will provide her Rx for Plavix 94m QD which she will pick up if she is near running out of Brilinta prior to obtaining Medicaid. Refills provided. Heart healthy diet and regular cardiovascular exercise encouraged.  Handouts on heart healthy diet and CAD provided. Motivated to make dietary changes - will refer to health coaching.  HTN -BP well controlled. Continue current antihypertensive regimen. Discussed to monitor BP at home at least 2 hours after medications and sitting for 5-10 minutes.   HLD -LDL on 07/20/2022 is 232, Atorvastatin 80 mg daily was started, she was enrolled in the Evolve MI trial and started on Repatha. Continue same. Will route to research team to inquire if lipid levels are blinded and [facility]   Lack of insurance - SW team aware. She has been mailed info to enroll in [place] they will follow up with her next week.    Cardiac Rehabilitation Eligibility Assessment  The patient is ready to start cardiac rehabilitation from a cardiac standpoint. (Likely will be unable to participate due to cost)      Disposition: Follow up in 2 months with Dr. CHarrell Gaveor CLoel Dubonnet NP    Medication Adjustments/Labs and Tests Ordered: Current medicines are reviewed at length with the patient today.  Concerns regarding medicines are outlined above.  Orders Placed This Encounter  Procedures   Referral to HRT/VAS Care Navigation   EKG 12-Lead   Meds ordered this encounter  Medications   losartan (COZAAR) 50 MG tablet    Sig: Take 1 tablet (50 mg total) by mouth daily.     Dispense:  30 tablet    Refill:  5    Order Specific Question:   Supervising Provider    Answer:   CBuford Dresser[[id number]  atorvastatin (LIPITOR) 80 MG tablet    Sig: Take 1 tablet (80 mg total) by mouth daily.    Dispense:  30 tablet    Refill:  5    Order Specific Question:   Supervising Provider    Answer:   CMaris Berger  clopidogrel (PLAVIX) 75 MG tablet    Sig: Take 1 tablet (75 mg total) by mouth daily. Take one tablet daily IF you run out of Brilinta.    Dispense:  30 tablet    Refill:  5    Take one tablet daily IF you run out of Brilinta.    Order Specific Question:   Supervising Provider    Answer:   CBuford Dresser[[id number]  metoprolol succinate (TOPROL XL) 25 MG 24 hr tablet    Sig: Take 1 tablet (25 mg total) by mouth daily.    Dispense:  30 tablet    Refill:  5    Order Specific Question:   Supervising Provider    Answer:   CBuford Dresser[[id number]   Patient Instructions  Medication Instructions:  Your physician has recommended you make the following change in your medication:   STOP Metoprolol Tartrate  START Metoprolol Succinate one 246mtable daily  ___________________  We will fill out patient assistance paperwork for Brilinta. However this will be covered by Medicaid. Information on applying for Medicaid  is below.   To prevent any interruption in therapy, we will send prescription for Plavix 31m daily. IF you run out of Brilinta - take Plavix one 75 mg tablet daily  instead  *If you need a refill on your cardiac medications before your next appointment, please call your pharmacy*   Lab Work/Testing/Procedures: None ordered today.   Follow-Up: [facility] and your health needs are our priority.  As part of our continuing mission to provide you with exceptional heart care, we have created designated Provider Care Teams.  These Care Teams include your primary Cardiologist (physician) and  Advanced Practice Providers (APPs -  Physician Assistants and Nurse Practitioners) who all work together to provide you with the care you need, when you need it.  We recommend signing up for the patient portal called "MyChart".  Sign up information is provided on this After Visit Summary.  MyChart is used to connect with patients for Virtual Visits (Telemedicine).  Patients are able to view lab/test results, encounter notes, upcoming appointments, etc.  Non-urgent messages can be sent to your provider as well.   To learn more about what you can do with MyChart, go to [email or web address]    Your next appointment:   2 month(s)  Provider:   BBuford Dresser MD or CLaurann Montana NP    Other Instructions  Heart Healthy Diet Recommendations: A low-salt diet is recommended. Meats should be grilled, baked, or boiled. Avoid fried foods. Focus on lean protein sources like fish or chicken with vegetables and fruits. The American Heart Association is a GMicrobiologist  American Heart Association Diet and Lifeystyle Recommendations   Exercise recommendations: The American Heart Association recommends 150 minutes of moderate intensity exercise weekly. Try 30 minutes of moderate intensity exercise 4-5 times per week. This could include walking, jogging, or swimming.  Continue to gradually increase your walking and cycling regimen If you are getting 20 minutes of exercise each week - try to increase to 30 minutes next week - then 40 minutes the week after ________________________________  For coronary artery disease often called "heart disease" we aim for optimal guideline directed medical therapy. We use the "A, B, C"s to help keep [place] track!  A = Aspirin 88mdaily B = Beta blocker which helps to relax the heart. This is your Metoprolol. C = Cholesterol control. You take Atorvastatin and Repatha to help control your cholesterol.  D = Don't forget nitroglycerin! This is an emergency  tablet to be used if you have chest pain. E = Extras. In your case, this is Brilinta or Plavix for one year to protect your stent    _______________________________  Medicaid information has been mailed to your home. If by next week you do not receive, would recommend go in person to DSS to apply or online via e-pass! One of our social work team will call you next Friday to check in.   Signed, CaLoel DubonnetNP  07/26/2022 1:32 PM    [place] [place]

## 2022-07-26 ENCOUNTER — Encounter (HOSPITAL_BASED_OUTPATIENT_CLINIC_OR_DEPARTMENT_OTHER): Payer: Self-pay | Admitting: Cardiology

## 2022-07-26 ENCOUNTER — Ambulatory Visit (INDEPENDENT_AMBULATORY_CARE_PROVIDER_SITE_OTHER): Payer: Medicaid Other | Admitting: Family

## 2022-07-26 ENCOUNTER — Telehealth: Payer: Self-pay

## 2022-07-26 VITALS — BP 126/60 | HR 63 | Ht 68.0 in | Wt 152.0 lb

## 2022-07-26 DIAGNOSIS — I10 Essential (primary) hypertension: Secondary | ICD-10-CM | POA: Diagnosis not present

## 2022-07-26 DIAGNOSIS — Z5989 Other problems related to housing and economic circumstances: Secondary | ICD-10-CM | POA: Diagnosis not present

## 2022-07-26 DIAGNOSIS — E785 Hyperlipidemia, unspecified: Secondary | ICD-10-CM | POA: Diagnosis not present

## 2022-07-26 DIAGNOSIS — E782 Mixed hyperlipidemia: Secondary | ICD-10-CM

## 2022-07-26 DIAGNOSIS — I2510 Atherosclerotic heart disease of native coronary artery without angina pectoris: Secondary | ICD-10-CM

## 2022-07-26 DIAGNOSIS — I25118 Atherosclerotic heart disease of native coronary artery with other forms of angina pectoris: Secondary | ICD-10-CM | POA: Diagnosis not present

## 2022-07-26 DIAGNOSIS — Z0000 Encounter for general adult medical examination without abnormal findings: Secondary | ICD-10-CM

## 2022-07-26 DIAGNOSIS — I2119 ST elevation (STEMI) myocardial infarction involving other coronary artery of inferior wall: Secondary | ICD-10-CM

## 2022-07-26 MED ORDER — ATORVASTATIN CALCIUM 80 MG PO TABS: 80.0000 mg | ORAL_TABLET | Freq: Every day | ORAL | 5 refills | Status: DC

## 2022-07-26 MED ORDER — CLOPIDOGREL BISULFATE 75 MG PO TABS: 75.0000 mg | ORAL_TABLET | Freq: Every day | ORAL | 5 refills | Status: DC

## 2022-07-26 MED ORDER — METOPROLOL SUCCINATE ER 25 MG PO TB24: 25.0000 mg | ORAL_TABLET | Freq: Every day | ORAL | 5 refills | Status: DC

## 2022-07-26 MED ORDER — LOSARTAN POTASSIUM 50 MG PO TABS: 50.0000 mg | ORAL_TABLET | Freq: Every day | ORAL | 5 refills | Status: DC

## 2022-07-26 NOTE — Telephone Encounter (Signed)
Called patient per health coaching referral from Laurann Montana, NP regarding a heart healthy diet and increasing physical activity. Patient is interested in health coaching and has been scheduled to meet in-person on 2/12 at 11:00am.   Marisa Leeds, MS, ERHD, [facility]  Care Guide, Health & Wellness Coach [address] [place] [address] Telephone: [phone] Email: [email or web address]

## 2022-07-26 NOTE — Patient Instructions (Addendum)
Medication Instructions:  Your physician has recommended you make the following change in your medication:   STOP Metoprolol Tartrate  START Metoprolol Succinate one 16m table daily  ___________________  We will fill out patient assistance paperwork for Brilinta. However this will be covered by Medicaid. Information on applying for Medicaid is below.   To prevent any interruption in therapy, we will send prescription for Plavix 730mdaily. IF you run out of Brilinta - take Plavix one 75 mg tablet daily  instead  *If you need a refill on your cardiac medications before your next appointment, please call your pharmacy*   Lab Work/Testing/Procedures: None ordered today.   Follow-Up: [facility].  As part of our continuing mission to provide you with exceptional heart care, we have created designated Provider Care Teams.  These Care Teams include your primary Cardiologist (physician) and Advanced Practice Providers (APPs -  Physician Assistants and Nurse Practitioners) who all work together to provide you with the care you need, when you need it.  We recommend signing up for the patient portal called "MyChart".  Sign up information is provided on this After Visit Summary.  MyChart is used to connect with patients for Virtual Visits (Telemedicine).  Patients are able to view lab/test results, encounter notes, upcoming appointments, etc.  Non-urgent messages can be sent to your provider as well.   To learn more about what you can do with MyChart, go to [email or web address]   Your next appointment:   2 month(s)  Provider:   BrBuford DresserMD or CaLaurann MontanaNP    Other Instructions  Heart Healthy Diet Recommendations: A low-salt diet is recommended. Meats should be grilled, baked, or boiled. Avoid fried foods. Focus on lean protein sources like fish or chicken with vegetables and fruits. The American Heart  Association is a GRMicrobiologist American Heart Association Diet and Lifeystyle Recommendations   Exercise recommendations: The American Heart Association recommends 150 minutes of moderate intensity exercise weekly. Try 30 minutes of moderate intensity exercise 4-5 times per week. This could include walking, jogging, or swimming.  Continue to gradually increase your walking and cycling regimen If you are getting 20 minutes of exercise each week - try to increase to 30 minutes next week - then 40 minutes the week after ________________________________  For coronary artery disease often called "heart disease" we aim for optimal guideline directed medical therapy. We use the "A, B, C"s to help keep usKorean track!  A = Aspirin 8192maily B = Beta blocker which helps to relax the heart. This is your Metoprolol. C = Cholesterol control. You take Atorvastatin and Repatha to help control your cholesterol.  D = Don't forget nitroglycerin! This is an emergency tablet to be used if you have chest pain. E = Extras. In your case, this is Brilinta or Plavix for one year to protect your stent    _______________________________  Medicaid information has been mailed to your home. If by next week you do not receive, would recommend go in person to DSS to apply or online via e-pass! One of our social work team will call you next Friday to check in.

## 2022-07-29 ENCOUNTER — Encounter (HOSPITAL_BASED_OUTPATIENT_CLINIC_OR_DEPARTMENT_OTHER): Payer: Self-pay

## 2022-07-29 ENCOUNTER — Ambulatory Visit: Payer: Self-pay | Attending: Cardiology

## 2022-07-29 DIAGNOSIS — Z0000 Encounter for general adult medical examination without abnormal findings: Secondary | ICD-10-CM

## 2022-07-29 NOTE — Patient Instructions (Addendum)
Low-Sodium Eating Plan Sodium, which is an element that makes up salt, helps you maintain a healthy balance of fluids in your body. Too much sodium can increase your blood pressure and cause fluid and waste to be held in your body. Your health care provider or dietitian may recommend following this plan if you have high blood pressure (hypertension), kidney disease, liver disease, or heart failure. Eating less sodium can help lower your blood pressure, reduce swelling, and protect your heart, liver, and kidneys. What are tips for following this plan? Reading food labels The Nutrition Facts label lists the amount of sodium in one serving of the food. If you eat more than one serving, you must multiply the listed amount of sodium by the number of servings. Choose foods with less than 140 mg of sodium per serving. Avoid foods with 300 mg of sodium or more per serving. Shopping  Look for lower-sodium products, often labeled as "low-sodium" or "no salt added." Always check the sodium content, even if foods are labeled as "unsalted" or "no salt added." Buy fresh foods. Avoid canned foods and pre-made or frozen meals. Avoid canned, cured, or processed meats. Buy breads that have less than 80 mg of sodium per slice. Cooking  Eat more home-cooked food and less restaurant, buffet, and fast food. Avoid adding salt when cooking. Use salt-free seasonings or herbs instead of table salt or sea salt. Check with your health care provider or pharmacist before using salt substitutes. Cook with plant-based oils, such as canola, sunflower, or olive oil. Meal planning When eating at a restaurant, ask that your food be prepared with less salt or no salt, if possible. Avoid dishes labeled as brined, pickled, cured, smoked, or made with soy sauce, miso, or teriyaki sauce. Avoid foods that contain MSG (monosodium glutamate). MSG is sometimes added to [place] food, bouillon, and some canned foods. Make meals that can  be grilled, baked, poached, roasted, or steamed. These are generally made with less sodium. General information Most people on this plan should limit their sodium intake to 1,500-2,000 mg (milligrams) of sodium each day. What foods should I eat? Fruits Fresh, frozen, or canned fruit. Fruit juice. Vegetables Fresh or frozen vegetables. "No salt added" canned vegetables. "No salt added" tomato sauce and paste. Low-sodium or reduced-sodium tomato and vegetable juice. Grains Low-sodium cereals, including oats, puffed wheat and rice, and shredded wheat. Low-sodium crackers. Unsalted rice. Unsalted pasta. Low-sodium bread. Whole-grain breads and whole-grain pasta. Meats and other proteins Fresh or frozen (no salt added) meat, poultry, seafood, and fish. Low-sodium canned tuna and salmon. Unsalted nuts. Dried peas, beans, and lentils without added salt. Unsalted canned beans. Eggs. Unsalted nut butters. Dairy Milk. Soy milk. Cheese that is naturally low in sodium, such as ricotta cheese, fresh mozzarella, or Swiss cheese. Low-sodium or reduced-sodium cheese. Cream cheese. Yogurt. Seasonings and condiments Fresh and dried herbs and spices. Salt-free seasonings. Low-sodium mustard and ketchup. Sodium-free salad dressing. Sodium-free light mayonnaise. Fresh or refrigerated horseradish. Lemon juice. Vinegar. Other foods Homemade, reduced-sodium, or low-sodium soups. Unsalted popcorn and pretzels. Low-salt or salt-free chips. The items listed above may not be a complete list of foods and beverages you can eat. Contact a dietitian for more information. What foods should I avoid? Vegetables Sauerkraut, pickled vegetables, and relishes. Olives. [place] fries. Onion rings. Regular canned vegetables (not low-sodium or reduced-sodium). Regular canned tomato sauce and paste (not low-sodium or reduced-sodium). Regular tomato and vegetable juice (not low-sodium or reduced-sodium). Frozen vegetables in  sauces. Grains  Instant hot cereals. Bread stuffing, pancake, and biscuit mixes. Croutons. Seasoned rice or pasta mixes. Noodle soup cups. Boxed or frozen macaroni and cheese. Regular salted crackers. Self-rising flour. Meats and other proteins Meat or fish that is salted, canned, smoked, spiced, or pickled. Precooked or cured meat, such as sausages or meat loaves. Berniece Salines. Ham. Pepperoni. Hot dogs. Corned beef. Chipped beef. Salt pork. Jerky. Pickled herring. Anchovies and sardines. Regular canned tuna. Salted nuts. Dairy Processed cheese and cheese spreads. Hard cheeses. Cheese curds. Blue cheese. Feta cheese. String cheese. Regular cottage cheese. Buttermilk. Canned milk. Fats and oils Salted butter. Regular margarine. Ghee. Bacon fat. Seasonings and condiments Onion salt, garlic salt, seasoned salt, table salt, and sea salt. Canned and packaged gravies. Worcestershire sauce. Tartar sauce. Barbecue sauce. Teriyaki sauce. Soy sauce, including reduced-sodium. Steak sauce. Fish sauce. Oyster sauce. Cocktail sauce. Horseradish that you find on the shelf. Regular ketchup and mustard. Meat flavorings and tenderizers. Bouillon cubes. Hot sauce. Pre-made or packaged marinades. Pre-made or packaged taco seasonings. Relishes. Regular salad dressings. Salsa. Other foods Salted popcorn and pretzels. Corn chips and puffs. Potato and tortilla chips. Canned or dried soups. Pizza. Frozen entrees and pot pies. The items listed above may not be a complete list of foods and beverages you should avoid. Contact a dietitian for more information. Summary Eating less sodium can help lower your blood pressure, reduce swelling, and protect your heart, liver, and kidneys. Most people on this plan should limit their sodium intake to 1,500-2,000 mg (milligrams) of sodium each day. Canned, boxed, and frozen foods are high in sodium. Restaurant foods, fast foods, and pizza are also very high in sodium. You also get sodium by  adding salt to food. Try to cook at home, eat more fresh fruits and vegetables, and eat less fast food and canned, processed, or prepared foods. This information is not intended to replace advice given to you by your health care provider. Make sure you discuss any questions you have with your health care provider. Document Revised: 07/09/2019 Document Reviewed: 05/05/2019 Elsevier Patient Education  [place] refers to food and lifestyle choices that are based on the traditions of countries located on the [employer]. It focuses on eating more fruits, vegetables, whole grains, beans, nuts, seeds, and heart-healthy fats, and eating less dairy, meat, eggs, and processed foods with added sugar, salt, and fat. This way of eating has been shown to help prevent certain conditions and improve outcomes for people who have chronic diseases, like kidney disease and heart disease. What are tips for following this plan? Reading food labels Check the serving size of packaged foods. For foods such as rice and pasta, the serving size refers to the amount of cooked product, not dry. Check the total fat in packaged foods. Avoid foods that have saturated fat or trans fats. Check the ingredient list for added sugars, such as corn syrup. Shopping  Buy a variety of foods that offer a balanced diet, including: Fresh fruits and vegetables (produce). Grains, beans, nuts, and seeds. Some of these may be available in unpackaged forms or large amounts (in bulk). Fresh seafood. Poultry and eggs. Low-fat dairy products. Buy whole ingredients instead of prepackaged foods. Buy fresh fruits and vegetables in-season from local farmers markets. Buy plain frozen fruits and vegetables. If you do not have access to quality fresh seafood, buy precooked frozen shrimp or canned fish, such as tuna, salmon, or sardines. Stock Conservator, museum/gallery so you  always have certain foods on  hand, such as olive oil, canned tuna, canned tomatoes, rice, pasta, and beans. Cooking Cook foods with extra-virgin olive oil instead of using butter or other vegetable oils. Have meat as a side dish, and have vegetables or grains as your main dish. This means having meat in small portions or adding small amounts of meat to foods like pasta or stew. Use beans or vegetables instead of meat in common dishes like chili or lasagna. Experiment with different cooking methods. Try roasting, broiling, steaming, and sauting vegetables. Add frozen vegetables to soups, stews, pasta, or rice. Add nuts or seeds for added healthy fats and plant protein at each meal. You can add these to yogurt, salads, or vegetable dishes. Marinate fish or vegetables using olive oil, lemon juice, garlic, and fresh herbs. Meal planning Plan to eat one vegetarian meal one day each week. Try to work up to two vegetarian meals, if possible. Eat seafood two or more times a week. Have healthy snacks readily available, such as: Vegetable sticks with hummus. Greek yogurt. Fruit and nut trail mix. Eat balanced meals throughout the week. This includes: Fruit: 2-3 servings a day. Vegetables: 4-5 servings a day. Low-fat dairy: 2 servings a day. Fish, poultry, or lean meat: 1 serving a day. Beans and legumes: 2 or more servings a week. Nuts and seeds: 1-2 servings a day. Whole grains: 6-8 servings a day. Extra-virgin olive oil: 3-4 servings a day. Limit red meat and sweets to only a few servings a month. Lifestyle  Cook and eat meals together with your family, when possible. Drink enough fluid to keep your urine pale yellow. Be physically active every day. This includes: Aerobic exercise like running or swimming. Leisure activities like gardening, walking, or housework. Get 7-8 hours of sleep each night. If recommended by your health care provider, drink red wine in moderation. This means 1 glass a day for nonpregnant women  and 2 glasses a day for men. A glass of wine equals 5 oz (150 mL). What foods should I eat? Fruits Apples. Apricots. Avocado. Berries. Bananas. Cherries. Dates. Figs. Grapes. Lemons. Melon. Oranges. Peaches. Plums. Pomegranate. Vegetables Artichokes. Beets. Broccoli. Cabbage. Carrots. Eggplant. Green beans. Chard. Kale. Spinach. Onions. Leeks. Peas. Squash. Tomatoes. Peppers. Radishes. Grains Whole-grain pasta. Brown rice. Bulgur wheat. Polenta. Couscous. Whole-wheat bread. Orpah Cobb. Meats and other proteins Beans. Almonds. Sunflower seeds. Pine nuts. Peanuts. Cod. Salmon. Scallops. Shrimp. Tuna. Tilapia. Clams. Oysters. Eggs. Poultry without skin. Dairy Low-fat milk. Cheese. Greek yogurt. Fats and oils Extra-virgin olive oil. Avocado oil. Grapeseed oil. Beverages Water. Red wine. Herbal tea. Sweets and desserts Greek yogurt with honey. Baked apples. Poached pears. Trail mix. Seasonings and condiments Basil. Cilantro. Coriander. Cumin. Mint. Parsley. Sage. Rosemary. Tarragon. Garlic. Oregano. Thyme. Pepper. Balsamic vinegar. Tahini. Hummus. Tomato sauce. Olives. Mushrooms. The items listed above may not be a complete list of foods and beverages you can eat. Contact a dietitian for more information. What foods should I limit? This is a list of foods that should be eaten rarely or only on special occasions. Fruits Fruit canned in syrup. Vegetables Deep-fried potatoes (french fries). Grains Prepackaged pasta or rice dishes. Prepackaged cereal with added sugar. Prepackaged snacks with added sugar. Meats and other proteins Beef. Pork. Lamb. Poultry with skin. Hot dogs. Tomasa Blase. Dairy Ice cream. Sour cream. Whole milk. Fats and oils Butter. Canola oil. Vegetable oil. Beef fat (tallow). Lard. Beverages Juice. Sugar-sweetened soft drinks. Beer. Liquor and spirits. Sweets and desserts Cookies. Cakes. Pies.  Candy. Seasonings and condiments Mayonnaise. Pre-made sauces and  marinades. The items listed above may not be a complete list of foods and beverages you should limit. Contact a dietitian for more information. Summary The Mediterranean diet includes both food and lifestyle choices. Eat a variety of fresh fruits and vegetables, beans, nuts, seeds, and whole grains. Limit the amount of red meat and sweets that you eat. If recommended by your health care provider, drink red wine in moderation. This means 1 glass a day for nonpregnant women and 2 glasses a day for men. A glass of wine equals 5 oz (150 mL). This information is not intended to replace advice given to you by your health care provider. Make sure you discuss any questions you have with your health care provider. Document Revised: 07/09/2019 Document Reviewed: 05/06/2019 Elsevier Patient Education  2023 [employer]   Heart-Healthy Eating Plan Many factors influence your heart health, including eating and exercise habits. Heart health is also called coronary health. Coronary risk increases with abnormal blood fat (lipid) levels. A heart-healthy eating plan includes limiting unhealthy fats, increasing healthy fats, limiting salt (sodium) intake, and making other diet and lifestyle changes. What is my plan? Your health care provider may recommend that: You limit your fat intake to _________% or less of your total calories each day. You limit your saturated fat intake to _________% or less of your total calories each day. You limit the amount of cholesterol in your diet to less than _________ mg per day. You limit the amount of sodium in your diet to less than _________ mg per day. What are tips for following this plan? Cooking Cook foods using methods other than frying. Baking, boiling, grilling, and broiling are all good options. Other ways to reduce fat include: Removing the skin from poultry. Removing all visible fats from meats. Steaming vegetables in water or broth. Meal planning  At meals,  imagine dividing your plate into fourths: Fill one-half of your plate with vegetables and green salads. Fill one-fourth of your plate with whole grains. Fill one-fourth of your plate with lean protein foods. Eat 2-4 cups of vegetables per day. One cup of vegetables equals 1 cup (91 g) broccoli or cauliflower florets, 2 medium carrots, 1 large bell pepper, 1 large sweet potato, 1 large tomato, 1 medium white potato, 2 cups (150 g) raw leafy greens. Eat 1-2 cups of fruit per day. One cup of fruit equals 1 small apple, 1 large banana, 1 cup (237 g) mixed fruit, 1 large orange,  cup (82 g) dried fruit, 1 cup (240 mL) 100% fruit juice. Eat more foods that contain soluble fiber. Examples include apples, broccoli, carrots, beans, peas, and barley. Aim to get 25-30 g of fiber per day. Increase your consumption of legumes, nuts, and seeds to 4-5 servings per week. One serving of dried beans or legumes equals  cup (90 g) cooked, 1 serving of nuts is  oz (12 almonds, 24 pistachios, or 7 walnut halves), and 1 serving of seeds equals  oz (8 g). Fats Choose healthy fats more often. Choose monounsaturated and polyunsaturated fats, such as olive and canola oils, avocado oil, flaxseeds, walnuts, almonds, and seeds. Eat more omega-3 fats. Choose salmon, mackerel, sardines, tuna, flaxseed oil, and ground flaxseeds. Aim to eat fish at least 2 times each week. Check food labels carefully to identify foods with trans fats or high amounts of saturated fat. Limit saturated fats. These are found in animal products, such as meats, butter, and  cream. Plant sources of saturated fats include palm oil, palm kernel oil, and coconut oil. Avoid foods with partially hydrogenated oils in them. These contain trans fats. Examples are stick margarine, some tub margarines, cookies, crackers, and other baked goods. Avoid fried foods. General information Eat more home-cooked food and less restaurant, buffet, and fast food. Limit or  avoid alcohol. Limit foods that are high in added sugar and simple starches such as foods made using white refined flour (white breads, pastries, sweets). Lose weight if you are overweight. Losing just 5-10% of your body weight can help your overall health and prevent diseases such as diabetes and heart disease. Monitor your sodium intake, especially if you have high blood pressure. Talk with your health care provider about your sodium intake. Try to incorporate more vegetarian meals weekly. What foods should I eat? Fruits All fresh, canned (in natural juice), or frozen fruits. Vegetables Fresh or frozen vegetables (raw, steamed, roasted, or grilled). Green salads. Grains Most grains. Choose whole wheat and whole grains most of the time. Rice and pasta, including brown rice and pastas made with whole wheat. Meats and other proteins Lean, well-trimmed beef, veal, pork, and lamb. Chicken and [place] without skin. All fish and shellfish. Wild duck, rabbit, pheasant, and venison. Egg whites or low-cholesterol egg substitutes. Dried beans, peas, lentils, and tofu. Seeds and most nuts. Dairy Low-fat or nonfat cheeses, including ricotta and mozzarella. Skim or 1% milk (liquid, powdered, or evaporated). Buttermilk made with low-fat milk. Nonfat or low-fat yogurt. Fats and oils Non-hydrogenated (trans-free) margarines. Vegetable oils, including soybean, sesame, sunflower, olive, avocado, peanut, safflower, corn, canola, and cottonseed. Salad dressings or mayonnaise made with a vegetable oil. Beverages Water (mineral or sparkling). Coffee and tea. Unsweetened ice tea. Diet beverages. Sweets and desserts Sherbet, gelatin, and fruit ice. Small amounts of dark chocolate. Limit all sweets and desserts. Seasonings and condiments All seasonings and condiments. The items listed above may not be a complete list of foods and beverages you can eat. Contact a dietitian for more options. What foods should I  avoid? Fruits Canned fruit in heavy syrup. Fruit in cream or butter sauce. Fried fruit. Limit coconut. Vegetables Vegetables cooked in cheese, cream, or butter sauce. Fried vegetables. Grains Breads made with saturated or trans fats, oils, or whole milk. Croissants. Sweet rolls. Donuts. High-fat crackers, such as cheese crackers and chips. Meats and other proteins Fatty meats, such as hot dogs, ribs, sausage, bacon, rib-eye roast or steak. High-fat deli meats, such as salami and bologna. Caviar. Domestic duck and goose. Organ meats, such as liver. Dairy Cream, sour cream, cream cheese, and creamed cottage cheese. Whole-milk cheeses. Whole or 2% milk (liquid, evaporated, or condensed). Whole buttermilk. Cream sauce or high-fat cheese sauce. Whole-milk yogurt. Fats and oils Meat fat, or shortening. Cocoa butter, hydrogenated oils, palm oil, coconut oil, palm kernel oil. Solid fats and shortenings, including bacon fat, salt pork, lard, and butter. Nondairy cream substitutes. Salad dressings with cheese or sour cream. Beverages Regular sodas and any drinks with added sugar. Sweets and desserts Frosting. Pudding. Cookies. Cakes. Pies. Milk chocolate or white chocolate. Buttered syrups. Full-fat ice cream or ice cream drinks. The items listed above may not be a complete list of foods and beverages to avoid. Contact a dietitian for more information. Summary Heart-healthy meal planning includes limiting unhealthy fats, increasing healthy fats, limiting salt (sodium) intake and making other diet and lifestyle changes. Lose weight if you are overweight. Losing just 5-10% of your body weight can  help your overall health and prevent diseases such as diabetes and heart disease. Focus on eating a balance of foods, including fruits and vegetables, low-fat or nonfat dairy, lean protein, nuts and legumes, whole grains, and heart-healthy oils and fats. This information is not intended to replace advice given to  you by your health care provider. Make sure you discuss any questions you have with your health care provider. Document Revised: 07/09/2021 Document Reviewed: 07/09/2021 Elsevier Patient Education  [place].

## 2022-07-29 NOTE — Progress Notes (Signed)
Appointment Outcome: Completed, Session #: Initial                        Start time: 11:11am   End time: 12:11pm   Total Mins: 60 minutes   AGREEMENTS SECTION   Overall Goal(s): Increase physical activity to 30 minutes per day Improve healthy eating habits.                                    Agreement/Action Steps:  Increase physical activity Increase riding bike daily from 15-20 minutes Walk to and from mailbox 3-4 times per day Walk around in stores  Improve healthy eating habits Attend Feb. cooking class at [employer]   Progress Notes:   Improve healthy eating habits Met with patient today to discuss health coaching to increase physical activity and to improve healthy eating habits. Went over the [employer] and Code of ethics with patient. Patient did not have any questions regarding either document.  Patient stated that she wanted to know what to eat when she was dining out because she stays on the go, sometimes 2-3 days away from home. Patient stated that she eats away from home a lot. Discussed with patient the idea of meal planning and prepping snacks on shorter trips until returning home to cook. Discussed choosing baked meats over fried meats when eating at restaurants. Patient stated that this idea wouldn't work with her traveling and needed to know which restaurants are healthier to eat at and she would already choose the baked items.  Advised patient that I am not able to provide her a detail eating plan due to acting as a nutritionist is out of my scope of practice. Patient mentioned that she has food at home that according to the recommended heart healthy low sodium diet, she should avoid (e.g., bacon, sausage, and corned beef hash and want to know how she can incorporate these foods because she doesn't want her food to go to waste. Patient was provided a handout on a low sodium diet, and she stated that she does not eat some of the recommended foods (e.g.,  oats).  Discussed with the patient the strategy to practice portion control (e.g., instead of eating 3-4 strips of bacon, eat 1-2). Patient expressed that she is not a big eater. Patient shared that she had learned about the 80/20 rule during her last visit of eating clean 80% of the time. Patient shared that she has started paying attention to the sodium content in food more closely by reading food labels. Discussed with patient how she seasons her meals. Patient stated that she occasionally uses sea salt or a bouillon but knows contains a lot of sodium. Discussed with patient about using herbs and spices to season food.   Discussed with patient the idea of seeing a nutritionist, since her goals is to know what to eat when she is out of the home, so that she can develop an eating plan that meet her dietary needs. Discussed with patient the recommendation by provider to visit the American Heart Association Website to learn more about a heart healthy diet along with other tips. Patient stated that she thought the person that she would see for the class was a nutritionist that she was going to work with on her diet.    Increase physical activity Patient shared that she walks to her mailbox around 3-4xs/daily that  equals more than the recommendation of walking twice daily for 10 minutes. Patient explained that she was told to gradually increase her physical activity after one week. Patient stated that she walks much further when the walk from her car to stores and around in the store (e.g., grocery store).   Patient stated that she rides her bike at home daily for about 15 minutes in the morning. Patient mentioned that she has a treadmill that inclines but has not used it and was advised not to incline the treadmill while walking.   Patient stated that she can add an extra 5 minutes to her current time when riding her bike. Patient mentioned that her overall all goal is to work her way up to riding her bike  for 30 minutes over time.    Coaching Outcomes: Patient has established action steps to start working towards increasing her physical activity. Patient will attend the nutrition class at [facility] to gain insights on a Heart Healthy diet. Patient will work to establish action steps to improve her eating habits during the next session on 2/26.  Followed up with Laurann Montana, NP to determine when the nutrition class was going to take place and what it covers. Was informed that the next class will be held on 2/15 at [facility] in the [employer].   This class is with a cardiologist where participants will learn how to prepare a Heart Healthy meal. Participants will have a chance to ask questions and get tips for Heart Healthy eating.  Patient was provided a printed copy of the Welcome Letter and Code of Ethics for their records.   Patient was emailed information on the upcoming Teaching Kitchen class at [facility] on 2/15. Patient was provided documents on the [place] and Heart Healthy Diet and links to help spark her research on healthy restaurant options and heart healthy recipes.

## 2022-07-30 ENCOUNTER — Other Ambulatory Visit: Payer: Self-pay

## 2022-07-30 ENCOUNTER — Ambulatory Visit (INDEPENDENT_AMBULATORY_CARE_PROVIDER_SITE_OTHER): Payer: Medicaid Other | Admitting: Student

## 2022-07-30 ENCOUNTER — Encounter: Payer: Self-pay | Admitting: Student

## 2022-07-30 VITALS — BP 121/62 | HR 72 | Temp 98.1°F | Ht 67.0 in | Wt 152.1 lb

## 2022-07-30 DIAGNOSIS — E7800 Pure hypercholesterolemia, unspecified: Secondary | ICD-10-CM | POA: Diagnosis not present

## 2022-07-30 DIAGNOSIS — I10 Essential (primary) hypertension: Secondary | ICD-10-CM | POA: Diagnosis not present

## 2022-07-30 DIAGNOSIS — Z87891 Personal history of nicotine dependence: Secondary | ICD-10-CM

## 2022-07-30 DIAGNOSIS — I252 Old myocardial infarction: Secondary | ICD-10-CM | POA: Diagnosis not present

## 2022-07-30 DIAGNOSIS — Z86718 Personal history of other venous thrombosis and embolism: Secondary | ICD-10-CM | POA: Diagnosis not present

## 2022-07-30 NOTE — Patient Instructions (Signed)
[name], it was a pleasure seeing you today!  Today we discussed: - I'd like for you to come back in the next week or two to get lab work. Otherwise, continue taking your medications and follow-up with cardiology on the 26th.   I have ordered the following labs today:   Lab Orders         BMP8+Anion Gap      Follow-up: 3 months   Please make sure to arrive 15 minutes prior to your next appointment. If you arrive late, you may be asked to reschedule.   We look forward to seeing you next time. Please call our clinic at [phone] if you have any questions or concerns. The best time to call is Monday-Friday from 9am-4pm, but there is someone available 24/7. If after hours or the weekend, call the main hospital number and ask for the Internal Medicine Resident On-Call. If you need medication refills, please notify your pharmacy one week in advance and they will send [place] a request.  Thank you for letting us take part in your care. Wishing you the best!  Thank you, Sanjuan Dame, MD

## 2022-07-30 NOTE — Assessment & Plan Note (Signed)
Patient is presenting to [facility] as hospital follow-up from recent STEMI. She was admitted 2/1-2/3 and was found to have ST elevations in II, III, aVF and subsequently taken for a heart catheterization. Was fount to have thrombotic sub-total occlusion of LCS and treated with DES. Initial Echo revealed EF 35-45% with follow-up Echo after heart cath EF 60-65% without regional wall abnormalities or valvular disease.

## 2022-07-30 NOTE — Progress Notes (Signed)
   CC: hospital follow-up  HPI:  Marisa Fuller is a 62 y.o. person with medical history as below presenting to [facility] for hospital follow-up.  Please see problem-based list for further details, assessments, and plans.  Past Medical History:  Diagnosis Date  . Allergy   . Anemia    sickle cell trait  . CAD (coronary artery disease) 07/18/2022   DES to first obtuse marginal  . Clotting disorder ([place])   . DVT (deep venous thrombosis) ([place])    Remote DVT per notes  . GERD (gastroesophageal reflux disease)   . Hyperlipidemia   . Hypertension   . Sickle cell anemia (H[facility])    trait   Review of Systems:  As per HPI  Physical Exam:  Vitals:   07/30/22 1057  BP: 121/62  Pulse: 72  Temp: 98.1 F (36.7 C)  TempSrc: Oral  SpO2: 100%  Weight: 152 lb 1.6 oz (69 kg)  Height: 5\' 7"  (1.702 m)   General: Resting comfortably in chair in no acute distress CV: Regular rate, rhythm. No murmurs appreciated. Warm extremities.  Pulm: Normal work of breathing on room air. Clear to auscultation bilaterally. MSK: Normal bulk, tone. No peripheral edema noted. Skin: Warm, dry. No rashes or lesions appreciated. Neuro: Awake, alert, conversing appropriately. Grossly non-focal. Psych: Normal mood, affect, speech.   Assessment & Plan:   History of ST elevation myocardial infarction (STEMI) Patient is presenting to [facility] as hospital follow-up from recent STEMI. She was admitted 2/1-2/3 and was found to have ST elevations in II, III, aVF and subsequently taken for a heart catheterization. Was fount to have thrombotic sub-total occlusion of LCS and treated with DES. Initial Echo revealed EF 35-45% with follow-up Echo after heart cath EF 60-65% without regional wall abnormalities or valvular disease.    Patient discussed with Dr. Denita Lung, MD Internal Medicine PGY-3 Pager: [phone]

## 2022-07-31 ENCOUNTER — Telehealth: Payer: Self-pay | Admitting: Family

## 2022-07-31 NOTE — Assessment & Plan Note (Addendum)
Per chart review, patient had a DVT in 2013 and was subsequently put on anticoagulation for ~3 months. No documentation of provoked vs unprovoked, but suspect DVT was provoked since she received a short course of anticoagulation. She has not been on any anticoagulation since then and has not had any further episodes of clotting.

## 2022-07-31 NOTE — Assessment & Plan Note (Signed)
BP Readings from Last 3 Encounters:  07/30/22 121/62  07/26/22 126/60  07/20/22 127/66   Blood pressure appears at goal with current therapy. No issues with obtaining medication or medication tolerance.  - Continue losartan 71m daily - Metoprolol succinate 260mdaily - Follow-up BMP

## 2022-07-31 NOTE — Assessment & Plan Note (Signed)
Patient was found to have LDL 278 during most recent hospitalization. She reports she has been taking atorvastatin daily along with every 2 weeks Repatha thus far. She also notes that prior to this hospitalization she was eating whatever she wanted. Her favorite foods were typically burger and steak. Since discharge she has been struggling to find a diet that works well for her. Mentions she has been eating a lot of chicken but it starting to get old. Discussed alternative options, including different types of fish. She will continue to work on her diet.  - Dietary modifications discussed - Continue atorvastatin 32m daily - Follow-up with lipid clinic, continue with Repatha

## 2022-07-31 NOTE — Telephone Encounter (Signed)
Returned call to patient, discussed patient's ongoing fatigue pt is attributing to Losartan.  Explained fatigue would be an unusual side effect of Losartan.  While reviewing medications learned patient was taking Metoprolol Tartrate.  After reviewing office visit, noted patient was supposed to be taking metoprolol succinate. Explained to patient how once a day dosing may help with fatigue. Pt verbalized understanding.  We discussed how fatigue might remain for several week to months STEMI.  Pt is going to get her Metoprolol Succinate prescription.  She will start Succinate tomorrow. Georgana Curio MHA RN CCM

## 2022-07-31 NOTE — Telephone Encounter (Signed)
Pt c/o medication issue:  1. Name of Medication:   losartan (COZAAR) 50 MG tablet    2. How are you currently taking this medication (dosage and times per day)?   Take 1 tablet (50 mg total) by mouth daily.    3. Are you having a reaction (difficulty breathing--STAT)? No  4. What is your medication issue?  Pt states that she thinks medication is causing her to be tired and sleepy a lot. She would like a callback regarding this matter

## 2022-08-01 NOTE — Progress Notes (Signed)
Internal Medicine Clinic Attending  Case discussed with the resident at the time of the visit.  We reviewed the resident's history and exam and pertinent patient test results.  I agree with the assessment, diagnosis, and plan of care documented in the resident's note.  

## 2022-08-02 ENCOUNTER — Telehealth: Payer: Self-pay | Admitting: Licensed Clinical Social Worker

## 2022-08-02 NOTE — Telephone Encounter (Signed)
H&V Care Navigation CSW Progress Note  Clinical Social Worker contacted patient by phone to f/u to see if paperwork received by pt from [place] [address] team for Medicaid. No answer this morning at [phone]. Left voicemail- will re-attempt again as able.  Patient is participating in a Managed Medicaid Plan:  No, Self pay only.  SDOH Screenings   Food Insecurity: No Food Insecurity (07/30/2022)  Housing: Low Risk  (07/30/2022)  Transportation Needs: No Transportation Needs (07/30/2022)  Utilities: Not At Risk (07/30/2022)  Depression (PHQ2-9): Low Risk  (07/30/2022)  Physical Activity: Insufficiently Active (07/29/2022)  Social Connections: Unknown (07/30/2022)  Tobacco Use: Medium Risk (07/30/2022)    Westley Hummer, MSW, [place]  [phone]- work cell phone (preferred) [phone]- desk phone

## 2022-08-05 ENCOUNTER — Telehealth: Payer: Self-pay | Admitting: Cardiology

## 2022-08-05 NOTE — Telephone Encounter (Signed)
Pt c/o medication issue:  1. Name of Medication:   ticagrelor (BRILINTA) 90 MG TABS tablet    2. How are you currently taking this medication (dosage and times per day)?   3. Are you having a reaction (difficulty breathing--STAT)?   4. What is your medication issue? Pharmacy is requesting return call to get clarification on dosing of this medication.   Ref # [id number]

## 2022-08-05 NOTE — Telephone Encounter (Signed)
Returned call to pharmacy to provide clarification of prescription,    Patient assistance paperwork listed dosing as once a day but office note said twice, verbal correction made for dosing to be twice per day!

## 2022-08-08 ENCOUNTER — Telehealth: Payer: Self-pay | Admitting: Licensed Clinical Social Worker

## 2022-08-08 NOTE — Telephone Encounter (Signed)
H&V Care Navigation CSW Progress Note  Clinical Social Worker contacted patient by phone to f/u to see if paperwork received by pt from [place] team for Medicaid. No answer this morning at [phone]. Left second voicemail- remain available as needed should pt return my call.    Patient is participating in a Managed Medicaid Plan:  No, Self pay only.    SDOH Screenings   Food Insecurity: No Food Insecurity (07/30/2022)  Housing: Low Risk  (07/30/2022)  Transportation Needs: No Transportation Needs (07/30/2022)  Utilities: Not At Risk (07/30/2022)  Depression (PHQ2-9): Low Risk  (07/30/2022)  Physical Activity: Insufficiently Active (07/29/2022)  Social Connections: Unknown (07/30/2022)  Tobacco Use: Medium Risk (07/30/2022)    Westley Hummer, MSW, [place]  [phone]- work cell phone (preferred) [phone]- desk phone `

## 2022-08-12 ENCOUNTER — Telehealth: Payer: Self-pay

## 2022-08-12 ENCOUNTER — Ambulatory Visit: Payer: Self-pay

## 2022-08-12 ENCOUNTER — Telehealth (HOSPITAL_BASED_OUTPATIENT_CLINIC_OR_DEPARTMENT_OTHER): Payer: Self-pay | Admitting: Cardiology

## 2022-08-12 DIAGNOSIS — Z0000 Encounter for general adult medical examination without abnormal findings: Secondary | ICD-10-CM

## 2022-08-12 NOTE — Telephone Encounter (Signed)
Called patient to hold health coaching session over the phone as scheduled at 11am. Patient did not answer. Left patient a message to return call to hold session today or to reschedule.    Avelino Leeds, MS, ERHD, [facility]  Care Guide, Health & Wellness Coach [address] [place] [address] Telephone: [phone] Email: [email or web address]

## 2022-08-12 NOTE — Telephone Encounter (Signed)
Patient is returning Marisa Fuller's call regarding appt. Please advise.

## 2022-08-13 ENCOUNTER — Other Ambulatory Visit: Payer: Self-pay

## 2022-08-15 ENCOUNTER — Other Ambulatory Visit (INDEPENDENT_AMBULATORY_CARE_PROVIDER_SITE_OTHER): Payer: Medicaid Other

## 2022-08-15 DIAGNOSIS — I10 Essential (primary) hypertension: Secondary | ICD-10-CM | POA: Diagnosis not present

## 2022-08-17 LAB — BMP8+ANION GAP
Anion Gap: 18 mmol/L (ref 10.0–18.0)
BUN/Creatinine Ratio: 15 (ref 12–28)
BUN: 14 mg/dL (ref 8–27)
CO2: 22 mmol/L (ref 20–29)
Calcium: 9.3 mg/dL (ref 8.7–10.3)
Chloride: 105 mmol/L (ref 96–106)
Creatinine, Ser: 0.95 mg/dL (ref 0.57–1.00)
Glucose: 123 mg/dL — ABNORMAL HIGH (ref 70–99)
Potassium: 4.5 mmol/L (ref 3.5–5.2)
Sodium: 145 mmol/L — ABNORMAL HIGH (ref 134–144)
eGFR: 68 mL/min/{1.73_m2} (ref >59)

## 2022-08-19 ENCOUNTER — Encounter: Payer: Self-pay | Admitting: Student

## 2022-09-18 ENCOUNTER — Encounter: Payer: Self-pay | Admitting: *Deleted

## 2022-09-18 DIAGNOSIS — Z006 Encounter for examination for normal comparison and control in clinical research program: Secondary | ICD-10-CM

## 2022-09-18 NOTE — Research (Signed)
Follow-Up Visit Completed*   []  Not Necessary, No Potential Adverse Events Or Medication Issues Reported On Completed Subject Questionnaire   [x]  Yes, Contact With Subject/Alternate Contact Completed   []  Yes, No Contact With Subject/Alternate Contact Completed, But Electronic Health Record Was Reviewed   []  No, Unable To Contact Subject/Alternate Contact   Have you reviewed Ongoing medications on the Targeted Concomitant Medication form and updated the form as needed?   []  Yes   []  No   Subject Status*   [x]  Continuing In Follow-up   []  At Risk For Lost To Follow-up   []  Withdrawal From All Future Study Activities Including Passive Follow-up By [place] Record Review Or Contact With Healthcare Provider Or Family Member/Friend   []  Death   Vital Status*   [x]  Alive   []  Deceased   []  Unknown   Last Known To Be Alive Source*   [x]  Subject Completed Follow-up Questionnaire/Seen In Person/Via Telephone Contact   []  Family Member or Caretaker   []  Primary Physician Or Medical Records   []  Publicly Available Source   []  Other  Date of last dose taken   19-Sep-2022  Over the last 12 weeks did the subject miss any doses? NO  Over the last 12 weeks did the subject restart Evolocumab after an interruption? NO

## 2022-09-23 ENCOUNTER — Telehealth: Payer: Self-pay | Admitting: Cardiology

## 2022-09-23 NOTE — Telephone Encounter (Signed)
Returned call to patient,    She states that when she lifts anything it just really takes it out of her!   Reviewed the following information with the patient and offered her appointment time! She states that is very early and it will be hard to get up that soon but she will figure it out. Patient scheduled.   "Low suspicion symptoms are related to medication since her medications are in place to help prevent repeat heart attack.  If she wishes to have a sooner office visit we can bring her in during my empty slot tomorrow for an EKG and evaluation.  Otherwise, can follow-up with primary care.   Alver Sorrow, NP "

## 2022-09-23 NOTE — Telephone Encounter (Signed)
Returned call to patient, no answer, left message for patient to call back.

## 2022-09-23 NOTE — Telephone Encounter (Signed)
Pt c/o medication issue:  1. Name of Medication: Unsure  2. How are you currently taking this medication (dosage and times per day)?   3. Are you having a reaction (difficulty breathing--STAT)? No  4. What is your medication issue? Pt is having spells, that is causing her to feel from the chin/jaw down to her neck and chest like it is closing in. Pt stated that she can have these spells while sitting down or when moving. When the pt has these spells, she feels like she needs to stay still for the symptoms to pass. Pt did state that the feeling she gets is not like what she had felt when she had her heart attack. Pt is worried it is from either the amount of medications she is taking or one of the medication is causing her to feel this way.  Pt is requesting a callback.

## 2022-09-23 NOTE — Telephone Encounter (Signed)
Low suspicion symptoms are related to medication since her medications are in place to help prevent repeat heart attack.  If she wishes to have a sooner office visit we can bring her in during my empty slot tomorrow for an EKG and evaluation.  Otherwise, can follow-up with primary care.  Alver Sorrow, NP

## 2022-09-23 NOTE — Telephone Encounter (Signed)
Patient returned call to the office, transferred from the call center.   Patient states she was hesitant about medications after her heart attack. She didn't read any of her medication side effects initially. She states here lately and she isn't sure what changed, but she gets this episode where she gets from her jaw to her chest area, she losses her wind and a very funny feeling. Almost like she could pass out. She states when this happens she has to sit down until the symptoms pass. She states it doesn't feel like when she had her heart attack. She cannot correlate it with any medication. She states it started a few days ago. She states she hasn't started any new medications. She does endorse having lots of stress but doesn't believe it is a contributing factor. She states she doesn't notice a pattern to the spells, it happens in the day and the night. She states she has not checked her blood pressure. She denies any headaches, shortness of breath or chest pressure. She states it is like a tingling sensation, she describes it as the train running out out gas, she just has to sit down. She thinks they last about 5-7 minutes. She states if she gets up before the feeling is completely over it lasts longer. She states occasionally also has a sweat.

## 2022-09-24 ENCOUNTER — Encounter (HOSPITAL_BASED_OUTPATIENT_CLINIC_OR_DEPARTMENT_OTHER): Payer: Self-pay | Admitting: Family

## 2022-09-24 ENCOUNTER — Ambulatory Visit (INDEPENDENT_AMBULATORY_CARE_PROVIDER_SITE_OTHER): Payer: Medicaid Other | Admitting: Family

## 2022-09-24 VITALS — BP 122/62 | HR 65 | Ht 67.0 in | Wt 148.6 lb

## 2022-09-24 DIAGNOSIS — I252 Old myocardial infarction: Secondary | ICD-10-CM

## 2022-09-24 DIAGNOSIS — I10 Essential (primary) hypertension: Secondary | ICD-10-CM | POA: Diagnosis not present

## 2022-09-24 DIAGNOSIS — E785 Hyperlipidemia, unspecified: Secondary | ICD-10-CM

## 2022-09-24 DIAGNOSIS — I25118 Atherosclerotic heart disease of native coronary artery with other forms of angina pectoris: Secondary | ICD-10-CM | POA: Diagnosis not present

## 2022-09-24 DIAGNOSIS — J301 Allergic rhinitis due to pollen: Secondary | ICD-10-CM

## 2022-09-24 MED ORDER — FLUTICASONE PROPIONATE 50 MCG/ACT NA SUSP: 1.0000 | Freq: Every day | NASAL | 2 refills | Status: DC

## 2022-09-24 NOTE — Patient Instructions (Signed)
Medication Instructions:  Your physician has recommended you make the following change in your medication:   START Flonase  If you want to take a half tablet of Metoprolol instead of a whole tablet in the evening to see if this improves energy level, that would be fine.   *If you need a refill on your cardiac medications before your next appointment, please call your pharmacy*   Lab Work: Your physician recommends that you return for lab work today: lipid panel, CMP, CBC  If you have labs (blood work) drawn today and your tests are completely normal, you will receive your results only by: MyChart Message (if you have MyChart) OR A paper copy in the mail If you have any lab test that is abnormal or we need to change your treatment, we will call you to review the results.   Testing/Procedures: Your EKG today looked great!  Follow-Up: At [facility], you and your health needs are our priority.  As part of our continuing mission to provide you with exceptional heart care, we have created designated Provider Care Teams.  These Care Teams include your primary Cardiologist (physician) and Advanced Practice Providers (APPs -  Physician Assistants and Nurse Practitioners) who all work together to provide you with the care you need, when you need it.  We recommend signing up for the patient portal called "MyChart".  Sign up information is provided on this After Visit Summary.  MyChart is used to connect with patients for Virtual Visits (Telemedicine).  Patients are able to view lab/test results, encounter notes, upcoming appointments, etc.  Non-urgent messages can be sent to your provider as well.   To learn more about what you can do with MyChart, go to [email or web address].    Your next appointment:   As scheduled with Dr. Cristal Deer  Other Instructions  We have referred you to cardiac rehab to help increase your activity safely.  Heart Healthy Diet Recommendations: A  low-salt diet is recommended. Meats should be grilled, baked, or boiled. Avoid fried foods. Focus on lean protein sources like fish or chicken with vegetables and fruits. The American Heart Association is a Chief Technology Officer!  American Heart Association Diet and Lifeystyle Recommendations   Exercise recommendations: The American Heart Association recommends 150 minutes of moderate intensity exercise weekly. Try 30 minutes of moderate intensity exercise 4-5 times per week. This could include walking, jogging, or swimming.   Pursed Lip Breathing Pursed lip breathing is a technique to relieve the feeling of being short of breath.  Being short of breath can make you tense and anxious. Before you start this breathing exercise, take a minute to relax your shoulders and close your eyes. Then: Start the exercise by closing your mouth. Breathe in through your nose, taking a normal breath. You can do this at your normal rate of breathing. If you feel you are not getting enough air, breathe in while slowly counting to 2 or 3. Pucker (purse) your lips as if you were going to whistle. Gently tighten the muscles of your abdomenor press on your abdomen to help push the air out. Breathe out slowly through your pursed lips. Take at least twice as long to breathe out as it takes you to breathe in. Make sure that you breathe out all of the air, but do not force air out.

## 2022-09-24 NOTE — Progress Notes (Signed)
Cardiology Office Note:    Date:  09/24/2022   ID:  Marisa Fuller, DOB 31-Jan-1961, MRN [id number]  PCP:  Evlyn Kanner, MD   [facility] Providers Cardiologist:  Jodelle Red, MD     Referring MD: Evlyn Kanner, MD   Chief Complaint: Hospital follow up  History of Present Illness:    Marisa Fuller is a 62 y.o. female with a hx of CAD (STEMI 07/18/2022, DES to first obtuse marginal branch), HLD, hypertension, sickle cell trait, GERD, DVT (2013 following a leg injury), ICM (EF 35-45% during cath, repeat echo following day 60-65%) . Last seen 07/26/2022  Initially evaluated with our office by Dr. Cristal Deer in 2021 for palpitations and chest discomfort, coronary CT was ordered but patient was not able to complete due to cost.  She was lost to follow-up after that.  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 Associated with shortness of breath, nausea, diaphoresis.  Initial EKG she was concerning for subtle ST elevations in leads III, aVF, V4 through 5.  She was taken to the Cath Lab, LHC showed thrombotic subtotal occlusion of the mid LCS treated successfully with DES to OM1 branch.  Recommendations for DAPT with aspirin and Brilinta x 1 year, she was started on Lipitor 80 mg once daily, metoprolol to tartrate 25 mg twice daily, losartan 50 mg once daily, Brilinta 90 mg twice daily, aspirin 81 mg once daily.  She was also referred to the lipid clinic for LDL of 278.  She met criteria for the Evolve-MI trial and started on Repatha.  Last seen 07/26/2022.  No anginal symptoms.  She was referred to social work and was able to obtain Medicaid.  Patient assistance for Brilinta obtained.  Since that time she has contacted the office regarding concerned with medications and side effects.  She presents today independently. Tells me starting a few days ago has developed "episodes" where from her jaw to her mid chest she feels "closed in"  and feels like she can't get a deep breath. Reports no chest pain. Happens at no particular time of day. Does not seem to correlate with Brilinta. Notes she had one episode when she had to get up during the episode and it took longer to recover. No lightheadedness, dizziness.  No wheeze, fever, cough.  Does have allergy symptoms with stuffy nose and is taking Zyrtec.  She has been riding bike daily for exercise as well as walking with her grandchild outside. She is understandably frustrated at not being able to increase physical activity as much as she would like.    Notes she did feel a big difference in Metoprolol succinate versus tartrate as previous fatigue is resolved.  Past Medical History:  Diagnosis Date   Allergy    Anemia    sickle cell trait   CAD (coronary artery disease) 07/18/2022   DES to first obtuse marginal   Clotting disorder    DVT (deep venous thrombosis)    Remote DVT per notes   GERD (gastroesophageal reflux disease)    Hyperlipidemia    Hypertension    Sickle cell anemia    trait    Past Surgical History:  Procedure Laterality Date   BREAST SURGERY     CORONARY STENT INTERVENTION N/A 07/18/2022   Procedure: CORONARY STENT INTERVENTION;  Surgeon: Kathleene Hazel, MD;  Location: [facility];  Service: Cardiovascular;  Laterality: N/A;   COSMETIC SURGERY  LEFT HEART CATH AND CORONARY ANGIOGRAPHY N/A 07/18/2022   Procedure: LEFT HEART CATH AND CORONARY ANGIOGRAPHY;  Surgeon: Kathleene HazelMcAlhany, Christopher D, MD;  Location: [facility]VAS[facility] CV LAB;  Service: Cardiovascular;  Laterality: N/A;   TUBAL LIGATION      Current Medications: Current Meds  Medication Sig   aspirin EC 81 MG tablet Take 1 tablet (81 mg total) by mouth daily. Swallow whole.   atorvastatin (LIPITOR) 80 MG tablet Take 1 tablet (80 mg total) by mouth daily.   cetirizine (ZYRTEC) 10 MG tablet Take 10 mg by mouth at bedtime.   fluticasone (FLONASE) 50 MCG/ACT nasal spray Place 1 spray into  both nostrils daily.   losartan (COZAAR) 50 MG tablet Take 1 tablet (50 mg total) by mouth daily.   metoprolol succinate (TOPROL XL) 25 MG 24 hr tablet Take 1 tablet (25 mg total) by mouth daily.   Multiple Vitamins-Minerals (MULTIVITAMIN WOMEN PO) Take 1 tablet by mouth daily.   nitroGLYCERIN (NITROSTAT) 0.4 MG SL tablet Place 1 tablet (0.4 mg total) under the tongue every 5 (five) minutes as needed for chest pain.   Study - EVOLVE-MI - evolocumab (REPATHA) 140 mg/mL SQ injection (PI-Stuckey) Inject 1 mL (140 mg total) into the skin every 14 (fourteen) days. Inject 1 mL (140 mg) subcutaneously into abdomen, thigh, or upper arm every 14 days. Rotate injection sites and do not inject into areas where skin is tender, bruised, or red. Please contact [facility] for any questions or concerns regarding this medication.   ticagrelor (BRILINTA) 90 MG TABS tablet Take 1 tablet (90 mg total) by mouth 2 (two) times daily.   Current Facility-Administered Medications for the 09/24/22 encounter (Office Visit) with Alver SorrowWalker, Tyrick Dunagan S, NP  Medication   Study - EVOLVE-MI - evolocumab (REPATHA) 140 mg/mL SQ injection (PI-Stuckey)     Allergies:   Patient has no known allergies.   Social History   Socioeconomic History   Marital status: Single    Spouse name: Not on file   Number of children: Not on file   Years of education: Not on file   Highest education level: Not on file  Occupational History   Not on file  Tobacco Use   Smoking status: Former   Smokeless tobacco: Never  Substance and Sexual Activity   Alcohol use: Yes   Drug use: No   Sexual activity: Not on file  Other Topics Concern   Not on file  Social History Narrative   Not on file   Social Determinants of Health   Financial Resource Strain: Not on file  Food Insecurity: No Food Insecurity (07/30/2022)   Hunger Vital Sign    Worried About Running Out of Food in the Last Year: Never true    Ran Out of Food in the Last  Year: Never true  Transportation Needs: No Transportation Needs (07/30/2022)   PRAPARE - Administrator, Civil ServiceTransportation    Lack of Transportation (Medical): No    Lack of Transportation (Non-Medical): No  Physical Activity: Insufficiently Active (07/29/2022)   Exercise Vital Sign    Days of Exercise per Week: 7 days    Minutes of Exercise per Session: 20 min  Stress: Not on file  Social Connections: Unknown (07/30/2022)   Social Connection and Isolation Panel [NHANES]    Frequency of Communication with Friends and Family: More than three times a week    Frequency of Social Gatherings with Friends and Family: More than three times a week    Attends Religious Services:  More than 4 times per year    Active Member of Clubs or Organizations: No    Attends Banker Meetings: Never    Marital Status: Patient declined     Family History: The patient's family history includes ALS in her mother; Cancer in her sister; HIV/AIDS in her sister; Seizures in her mother.  ROS:   Please see the history of present illness.     All other systems reviewed and are negative.  EKGs/Labs/Other Studies Reviewed:    The following studies were reviewed today:  07/18/2022 left heart cath -     Prox RCA lesion is 30% stenosed.   Mid RCA lesion is 60% stenosed.   Mid RCA to Dist RCA lesion is 40% stenosed.   1st Mrg lesion is 99% stenosed.   Prox LAD lesion is 20% stenosed.   A drug-eluting stent was successfully placed using a STENT ONYX FRONTIER [id number].   Post intervention, there is a 0% residual stenosis.   There is mild left ventricular systolic dysfunction.   The left ventricular ejection fraction is 35-45% by visual estimate.   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 Diffuse mild disease in the proximal and  distal vessel.  Anteroapical hypokinesis of LV, LVEF around 40%.   Continue DAPT with ASA and Brilinta for one year. Will start high intensity statin and low dose beta blocker.   Medical management of the moderate stenosis in the mid RCA is appropriate.    07/19/2022 echo complete -    1. Sub optimal images due to breast implants.   2. Mildly abnormal strain with some apical sparing . Left ventricular  ejection fraction, by estimation, is 60 to 65%. The left ventricle has  normal function. The left ventricle has no regional wall motion  abnormalities. There is mild left ventricular  hypertrophy. Left ventricular diastolic parameters were normal. The  average left ventricular global longitudinal strain is -13.7 %. The global  longitudinal strain is abnormal.   3. Right ventricular systolic function is normal. The right ventricular  size is normal.   4. The mitral valve is abnormal. Trivial mitral valve regurgitation. No  evidence of mitral stenosis.   5. The aortic valve is tricuspid. Aortic valve regurgitation is not  visualized. No aortic stenosis is present.   6. The inferior vena cava is normal in size with greater than 50%  respiratory variability, suggesting right atrial pressure of 3 mmHg.   EKG:  EKG is ordered today.  The ekg ordered today demonstrates NSR 65 bpm with no acute ST/T wave changes.   Recent Labs: 07/18/2022: ALT 13; TSH 0.988 07/19/2022: Hemoglobin 12.5; Magnesium 2.0; Platelets 356 08/15/2022: BUN 14; Creatinine, Ser 0.95; Potassium 4.5; Sodium 145  Recent Lipid Panel    Component Value Date/Time   CHOL 317 (H) 07/20/2022 0658   CHOL 291 (H) 08/31/2019 1520   TRIG 202 (H) 07/20/2022 0658   HDL 45 07/20/2022 0658   HDL 53 08/31/2019 1520   CHOLHDL 7.0 07/20/2022 0658   VLDL 40 07/20/2022 0658   LDLCALC 232 (H) 07/20/2022 0658   LDLCALC 205 (H) 08/31/2019 1520    Physical Exam:    VS:  BP 122/62   Pulse 65   Ht 5\' 7"  (1.702 m)   Wt 148 lb 9.6 oz (67.4  kg)  BMI 23.27 kg/m     Wt Readings from Last 3 Encounters:  09/24/22 148 lb 9.6 oz (67.4 kg)  07/30/22 152 lb 1.6 oz (69 kg)  07/26/22 152 lb (68.9 kg)     GEN:  Well nourished, well developed in no acute distress HEENT: Normal NECK: No JVD; No carotid bruits LYMPHATICS: No lymphadenopathy CARDIAC: RRR, no murmurs, rubs, gallops RESPIRATORY:  Clear to auscultation without rales, wheezing or rhonchi  ABDOMEN: Soft, non-tender, non-distended MUSCULOSKELETAL:  No edema; No deformity  SKIN: Warm and dry.  Right radial catheterization site with no hematoma.  NEUROLOGIC:  Alert and oriented x 3 PSYCHIATRIC:  Normal affect   ASSESSMENT:    1. Coronary artery disease of native artery of native heart with stable angina pectoris   2. Hyperlipidemia LDL goal <70   3. Essential hypertension   4. History of ST elevation myocardial infarction (STEMI)   5. Seasonal allergic rhinitis due to pollen     PLAN:    In order of problems listed above:  Allergic rhinitis / Dyspnea -For the past few days developed episodes where from jaw to mid chest feels "closed in".  Not correlated with timing of Brilinta so low suspicion is contributory.  Happening at rest.  Different than prior anginal equivalent.  Does note nasal congestion not fully resolved by Zyrtec. No palpitations, chest pain, near syncope, syncope. EKG today no acute ST/T wave changes.  Consider etiology anxiety versus allergic rhinitis. Rx Flonase QD. Update CBC to rule out anemia or infection.  CAD/s/p STEMI - Stable with no anginal symptoms. No indication for ischemic evaluation.  GDMT of DAPT Aspirin/Brilinta, metoprolol succinate, atorvastatin, Repatha. Will need DAPT x 1 year.  Heart healthy diet and regular cardiovascular exercise encouraged.  Referred to cardiac rehab for further increasing physical activity as now has insurance coverage.   Cardiac Rehabilitation Eligibility Assessment  The patient is ready to start cardiac  rehabilitation from a cardiac standpoint.    HTN -BP well controlled. Continue current antihypertensive regimen. Discussed to monitor BP at home at least 2 hours after medications and sitting for 5-10 minutes.   HLD -LDL on 07/20/2022 was 232. Atorvastatin as well as Repatha initiated as part of Evolve MI trial  FLP/LFT today as lipid levels are not blinded per research study. Repatha paid for through research study.    Disposition: Follow up in 4-6 weeks with Dr. Cristal Deer or Alver Sorrow, NP    Medication Adjustments/Labs and Tests Ordered: Current medicines are reviewed at length with the patient today.  Concerns regarding medicines are outlined above.  Orders Placed This Encounter  Procedures   Lipid panel   Comprehensive metabolic panel   CBC   AMB referral to cardiac rehabilitation   EKG 12-Lead   Meds ordered this encounter  Medications   fluticasone (FLONASE) 50 MCG/ACT nasal spray    Sig: Place 1 spray into both nostrils daily.    Dispense:  18.2 mL    Refill:  2    Order Specific Question:   Supervising Provider    Answer:   Jodelle Red [[id number]]    Patient Instructions  Medication Instructions:  Your physician has recommended you make the following change in your medication:   START Flonase  If you want to take a half tablet of Metoprolol instead of a whole tablet in the evening to see if this improves energy level, that would be fine.   *If you need a refill on your cardiac medications before your  next appointment, please call your pharmacy*   Lab Work: Your physician recommends that you return for lab work today: lipid panel, CMP, CBC  If you have labs (blood work) drawn today and your tests are completely normal, you will receive your results only by: MyChart Message (if you have MyChart) OR A paper copy in the mail If you have any lab test that is abnormal or we need to change your treatment, we will call you to review the  results.   Testing/Procedures: Your EKG today looked great!  Follow-Up: At [facility], you and your health needs are our priority.  As part of our continuing mission to provide you with exceptional heart care, we have created designated Provider Care Teams.  These Care Teams include your primary Cardiologist (physician) and Advanced Practice Providers (APPs -  Physician Assistants and Nurse Practitioners) who all work together to provide you with the care you need, when you need it.  We recommend signing up for the patient portal called "MyChart".  Sign up information is provided on this After Visit Summary.  MyChart is used to connect with patients for Virtual Visits (Telemedicine).  Patients are able to view lab/test results, encounter notes, upcoming appointments, etc.  Non-urgent messages can be sent to your provider as well.   To learn more about what you can do with MyChart, go to [email or web address].    Your next appointment:   As scheduled with Dr. Cristal Deer  Other Instructions  We have referred you to cardiac rehab to help increase your activity safely.  Heart Healthy Diet Recommendations: A low-salt diet is recommended. Meats should be grilled, baked, or boiled. Avoid fried foods. Focus on lean protein sources like fish or chicken with vegetables and fruits. The American Heart Association is a Chief Technology Officer!  American Heart Association Diet and Lifeystyle Recommendations   Exercise recommendations: The American Heart Association recommends 150 minutes of moderate intensity exercise weekly. Try 30 minutes of moderate intensity exercise 4-5 times per week. This could include walking, jogging, or swimming.   Pursed Lip Breathing Pursed lip breathing is a technique to relieve the feeling of being short of breath.  Being short of breath can make you tense and anxious. Before you start this breathing exercise, take a minute to relax your shoulders and close your  eyes. Then: Start the exercise by closing your mouth. Breathe in through your nose, taking a normal breath. You can do this at your normal rate of breathing. If you feel you are not getting enough air, breathe in while slowly counting to 2 or 3. Pucker (purse) your lips as if you were going to whistle. Gently tighten the muscles of your abdomenor press on your abdomen to help push the air out. Breathe out slowly through your pursed lips. Take at least twice as long to breathe out as it takes you to breathe in. Make sure that you breathe out all of the air, but do not force air out.    Signed, Alver Sorrow, NP  09/24/2022 9:49 AM    [facility]

## 2022-09-25 LAB — CBC
Hematocrit: 36.2 % (ref 34.0–46.6)
Hemoglobin: 12 g/dL (ref 11.1–15.9)
MCH: 31 pg (ref 26.6–33.0)
MCHC: 33.1 g/dL (ref 31.5–35.7)
MCV: 94 fL (ref 79–97)
Platelets: 347 10*3/uL (ref 150–450)
RBC: 3.87 x10E6/uL (ref 3.77–5.28)
RDW: 13.5 % (ref 11.7–15.4)
WBC: 6.4 10*3/uL (ref 3.4–10.8)

## 2022-09-25 LAB — COMPREHENSIVE METABOLIC PANEL
ALT: 96 IU/L — ABNORMAL HIGH (ref 0–32)
AST: 45 IU/L — ABNORMAL HIGH (ref 0–40)
Albumin/Globulin Ratio: 1.6 (ref 1.2–2.2)
Albumin: 4.2 g/dL (ref 3.9–4.9)
Alkaline Phosphatase: 162 IU/L — ABNORMAL HIGH (ref 44–121)
BUN/Creatinine Ratio: 16 (ref 12–28)
BUN: 15 mg/dL (ref 8–27)
Bilirubin Total: 0.6 mg/dL (ref 0.0–1.2)
CO2: 20 mmol/L (ref 20–29)
Calcium: 9 mg/dL (ref 8.7–10.3)
Chloride: 108 mmol/L — ABNORMAL HIGH (ref 96–106)
Creatinine, Ser: 0.94 mg/dL (ref 0.57–1.00)
Globulin, Total: 2.6 g/dL (ref 1.5–4.5)
Glucose: 97 mg/dL (ref 70–99)
Potassium: 4.3 mmol/L (ref 3.5–5.2)
Sodium: 144 mmol/L (ref 134–144)
Total Protein: 6.8 g/dL (ref 6.0–8.5)
eGFR: 69 mL/min/{1.73_m2} (ref >59)

## 2022-09-25 LAB — LIPID PANEL
Chol/HDL Ratio: 1.6 ratio (ref 0.0–4.4)
Cholesterol, Total: 85 mg/dL — ABNORMAL LOW (ref 100–199)
HDL: 53 mg/dL (ref >39)
LDL Chol Calc (NIH): 16 mg/dL (ref 0–99)
Triglycerides: 77 mg/dL (ref 0–149)
VLDL Cholesterol Cal: 16 mg/dL (ref 5–40)

## 2022-09-26 ENCOUNTER — Telehealth (HOSPITAL_BASED_OUTPATIENT_CLINIC_OR_DEPARTMENT_OTHER): Payer: Self-pay

## 2022-09-26 DIAGNOSIS — I25118 Atherosclerotic heart disease of native coronary artery with other forms of angina pectoris: Secondary | ICD-10-CM

## 2022-09-26 DIAGNOSIS — E785 Hyperlipidemia, unspecified: Secondary | ICD-10-CM

## 2022-09-26 MED ORDER — ATORVASTATIN CALCIUM 40 MG PO TABS: 40.0000 mg | ORAL_TABLET | Freq: Every day | ORAL | 3 refills | Status: DC

## 2022-09-26 NOTE — Telephone Encounter (Addendum)
Called results to patient and left results on VM (ok per DPR), instructions left to call office back if patient has any questions!     ----- Message from Alver Sorrow, NP sent at 09/26/2022  8:01 AM EDT ----- CBC with no evidence of anemia nor infection.  Liver enzymes mildly elevated - reduce Atorvastatin to 40mg  daily. Normal kidney function. Cholesterol numbers look fantastic!  Repeat liver enzymes in 2 months for monitoring.

## 2022-10-01 ENCOUNTER — Ambulatory Visit (HOSPITAL_BASED_OUTPATIENT_CLINIC_OR_DEPARTMENT_OTHER): Payer: Self-pay | Admitting: Cardiology

## 2022-10-09 ENCOUNTER — Telehealth (HOSPITAL_COMMUNITY): Payer: Self-pay

## 2022-10-09 NOTE — Telephone Encounter (Signed)
Pt returned call and RN completed the Cardiac Rehab Nursing Assessment.

## 2022-10-09 NOTE — Telephone Encounter (Signed)
Pt called to confirm her Cardiac Rehab Orientation appointment tomorrow at 10:30. Instructions and directions given for the appointment. Will attempt to call back to complete the patient's Nursing Assessment.

## 2022-10-10 ENCOUNTER — Encounter (HOSPITAL_COMMUNITY)
Admission: RE | Admit: 2022-10-10 | Discharge: 2022-10-10 | Disposition: A | Discharge status: Home / Self Care | Payer: Medicaid Other | Source: Ambulatory Visit | Attending: Cardiology | Admitting: Cardiology

## 2022-10-10 VITALS — BP 126/70 | HR 61 | Ht 66.75 in | Wt 149.5 lb

## 2022-10-10 DIAGNOSIS — Z955 Presence of coronary angioplasty implant and graft: Secondary | ICD-10-CM | POA: Diagnosis present

## 2022-10-10 DIAGNOSIS — I2129 ST elevation (STEMI) myocardial infarction involving other sites: Secondary | ICD-10-CM | POA: Diagnosis present

## 2022-10-10 NOTE — Progress Notes (Signed)
Cardiac Rehab Medication Review   Does the patient  feel that his/her medications are working for him/her?  yes  Has the patient been experiencing any side effects to the medications prescribed?  yes  Does the patient measure his/her own blood pressure or blood glucose at home?  yes   Does the patient have any problems obtaining medications due to transportation or finances?   no  Understanding of regimen: good Understanding of indications: good Potential of compliance: good    Comments: Pt is unsure if she is having side effects form her medications. Reviewed the purpose of the following medications: Brilinta, Repatha, Metoprolol, and Losartan. Pt checks her blood pressure sometimes. Encouraged to measure regularly to establish her range of blood pressures.    Lorin Picket 10/10/2022 2:20 PM

## 2022-10-10 NOTE — Progress Notes (Signed)
Cardiac Individual Treatment Plan  Patient Details  Name: Miley Blanchett MRN: [id number] Date of Birth: [birth year] Referring Provider:   Flowsheet Row CARDIAC REHAB PHASE II ORIENTATION from 10/10/2022 in [facility]  Referring Provider Jodelle Red, MD       Initial Encounter Date:  Flowsheet Row CARDIAC REHAB PHASE II ORIENTATION from 10/10/2022 in [facility]  Date 10/10/22       Visit Diagnosis: 07/18/22 ST elevation myocardial infarction (STEMI)  07/18/22 OM Status post coronary artery stent placement  Patient's Home Medications on Admission:  Current Outpatient Medications:    aspirin EC 81 MG tablet, Take 1 tablet (81 mg total) by mouth daily. Swallow whole., Disp: , Rfl:    atorvastatin (LIPITOR) 40 MG tablet, Take 1 tablet (40 mg total) by mouth daily., Disp: 90 tablet, Rfl: 3   cetirizine (ZYRTEC) 10 MG tablet, Take 10 mg by mouth at bedtime., Disp: , Rfl:    fluticasone (FLONASE) 50 MCG/ACT nasal spray, Place 1 spray into both nostrils daily., Disp: 18.2 mL, Rfl: 2   losartan (COZAAR) 50 MG tablet, Take 1 tablet (50 mg total) by mouth daily., Disp: 30 tablet, Rfl: 5   metoprolol succinate (TOPROL XL) 25 MG 24 hr tablet, Take 1 tablet (25 mg total) by mouth daily., Disp: 30 tablet, Rfl: 5   nitroGLYCERIN (NITROSTAT) 0.4 MG SL tablet, Place 1 tablet (0.4 mg total) under the tongue every 5 (five) minutes as needed for chest pain., Disp: 25 tablet, Rfl: 1   Study - EVOLVE-MI - evolocumab (REPATHA) 140 mg/mL SQ injection (PI-Stuckey), Inject 1 mL (140 mg total) into the skin every 14 (fourteen) days. Inject 1 mL (140 mg) subcutaneously into abdomen, thigh, or upper arm every 14 days. Rotate injection sites and do not inject into areas where skin is tender, bruised, or red. Please contact [facility] for any questions or concerns regarding this  medication., Disp: 12 mL, Rfl: 0   ticagrelor (BRILINTA) 90 MG TABS tablet, Take 1 tablet (90 mg total) by mouth 2 (two) times daily., Disp: 60 tablet, Rfl: 11   Multiple Vitamins-Minerals (MULTIVITAMIN WOMEN PO), Take 1 tablet by mouth daily. (Patient not taking: Reported on 10/10/2022), Disp: , Rfl:   Current Facility-Administered Medications:    Study - EVOLVE-MI - evolocumab (REPATHA) 140 mg/mL SQ injection (PI-Stuckey), 140 mg, Subcutaneous, Q14 Days, Herby Abraham, MD, 140 mg at 07/24/22 1307  Past Medical History: Past Medical History:  Diagnosis Date   Allergy    Anemia    sickle cell trait   CAD (coronary artery disease) 07/18/2022   DES to first obtuse marginal   Clotting disorder    DVT (deep venous thrombosis)    Remote DVT per notes   GERD (gastroesophageal reflux disease)    Hyperlipidemia    Hypertension    Sickle cell anemia    trait    Tobacco Use: Social History   Tobacco Use  Smoking Status Former  Smokeless Tobacco Never    Labs: Review Flowsheet  More data exists      Latest Ref Rng & Units 08/31/2019 07/18/2022 07/19/2022 07/20/2022 09/24/2022  Labs for ITP Cardiac and Pulmonary Rehab  Cholestrol 100 - 199 mg/dL 409  - 811  914  85   LDL (calc) 0 - 99 mg/dL 782  - 956  213  16   HDL-C >39 mg/dL 53  - 52  45  53   Trlycerides 0 - 149 mg/dL 161  - 096  045  77   Hemoglobin A1c 4.8 - 5.6 % - 6.3  - - -  TCO2 22 - 32 mmol/L - 20  - - -    Capillary Blood Glucose: No results found for: "GLUCAP"   Exercise Target Goals: Exercise Program Goal: Individual exercise prescription set using results from initial 6 min walk test and THRR while considering  patient's activity barriers and safety.   Exercise Prescription Goal: Initial exercise prescription builds to 30-45 minutes a day of aerobic activity, 2-3 days per week.  Home exercise guidelines will be given to patient during program as part of exercise prescription that the participant will  acknowledge.  Activity Barriers & Risk Stratification:  Activity Barriers & Cardiac Risk Stratification - 10/10/22 1405       Activity Barriers & Cardiac Risk Stratification   Activity Barriers Deconditioning;Shortness of Breath    Cardiac Risk Stratification High             6 Minute Walk:  6 Minute Walk     Row Name 10/10/22 1212         6 Minute Walk   Phase Initial     Distance 1184 feet     Walk Time 6 minutes     # of Rest Breaks 0     MPH 2.24     METS 3.21     RPE 8     Perceived Dyspnea  1     VO2 Peak 11.22     Symptoms Yes (comment)     Comments SOB, RPD = 1, Fatigue     Resting HR 56 bpm     Resting BP 126/70     Resting Oxygen Saturation  100 %     Exercise Oxygen Saturation  during 6 min walk 100 %     Max Ex. HR 84 bpm     Max Ex. BP 132/78     2 Minute Post BP 120/70              Oxygen Initial Assessment:   Oxygen Re-Evaluation:   Oxygen Discharge (Final Oxygen Re-Evaluation):   Initial Exercise Prescription:  Initial Exercise Prescription - 10/10/22 1400       Date of Initial Exercise RX and Referring Provider   Date 10/10/22    Referring Provider bridgette Cristal Deer, MD    Expected Discharge Date 12/20/22      Bike   Level 1    Watts 25    Minutes 15    METs 3.2      NuStep   Level 1    SPM 75    Minutes 15    METs 3.2      Prescription Details   Frequency (times per week) 3    Duration Progress to 30 minutes of continuous aerobic without signs/symptoms of physical distress      Intensity   THRR 40-80% of Max Heartrate 62-123    Ratings of Perceived Exertion 11-13    Perceived Dyspnea 0-4      Progression   Progression Continue progressive overload as per policy without signs/symptoms or physical distress.      Resistance Training   Training Prescription Yes    Weight 3 lbs    Reps 10-15             Perform Capillary Blood Glucose checks as needed.  Exercise Prescription Changes:   Exercise  Comments:   Exercise Goals and Review:   Exercise Goals     Row Name 10/10/22 1404             Exercise Goals   Increase Physical Activity Yes       Intervention Provide advice, education, support and counseling about physical activity/exercise needs.;Develop an individualized exercise prescription for aerobic and resistive training based on initial evaluation findings, risk stratification, comorbidities and participant's personal goals.       Expected Outcomes Short Term: Attend rehab on a regular basis to increase amount of physical activity.;Long Term: Add in home exercise to make exercise part of routine and to increase amount of physical activity.;Long Term: Exercising regularly at least 3-5 days a week.       Increase Strength and Stamina Yes       Intervention Provide advice, education, support and counseling about physical activity/exercise needs.;Develop an individualized exercise prescription for aerobic and resistive training based on initial evaluation findings, risk stratification, comorbidities and participant's personal goals.       Expected Outcomes Short Term: Increase workloads from initial exercise prescription for resistance, speed, and METs.;Short Term: Perform resistance training exercises routinely during rehab and add in resistance training at home;Long Term: Improve cardiorespiratory fitness, muscular endurance and strength as measured by increased METs and functional capacity ( )       Able to understand and use rate of perceived exertion (RPE) scale Yes       Intervention Provide education and explanation on how to use RPE scale       Expected Outcomes Short Term: Able to use RPE daily in rehab to express subjective intensity level;Long Term:  Able to use RPE to guide intensity level when exercising independently       Knowledge and understanding of Target Heart Rate Range (THRR) Yes       Intervention Provide education and explanation of THRR including how the  numbers were predicted and where they are located for reference       Expected Outcomes Short Term: Able to state/look up THRR;Long Term: Able to use THRR to govern intensity when exercising independently;Short Term: Able to use daily as guideline for intensity in rehab       Understanding of Exercise Prescription Yes       Intervention Provide education, explanation, and written materials on patient's individual exercise prescription       Expected Outcomes Short Term: Able to explain program exercise prescription;Long Term: Able to explain home exercise prescription to exercise independently                Exercise Goals Re-Evaluation :   Discharge Exercise Prescription (Final Exercise Prescription Changes):   Nutrition:  Target Goals: Understanding of nutrition guidelines, daily intake of sodium 1500mg , cholesterol 200mg , calories 30% from fat and 7% or less from saturated fats, daily to have 5 or more servings of fruits and vegetables.  Biometrics:  Pre Biometrics - 10/10/22 1124       Pre Biometrics   Waist Circumference 35 inches    Hip Circumference 38 inches    Waist to Hip Ratio 0.92 %    Triceps Skinfold 16 mm    % Body Fat 33.2 %    Grip Strength 41 kg    Flexibility 13 in    Single Leg Stand 30 seconds              Nutrition Therapy Plan and Nutrition Goals:   Nutrition Assessments:  MEDIFICTS Score  Key: ?70 Need to make dietary changes  40-70 Heart Healthy Diet ? 40 Therapeutic Level Cholesterol Diet    Picture Your Plate Scores: <63 Unhealthy dietary pattern with much room for improvement. 41-50 Dietary pattern unlikely to meet recommendations for good health and room for improvement. 51-60 More healthful dietary pattern, with some room for improvement.  >60 Healthy dietary pattern, although there may be some specific behaviors that could be improved.    Nutrition Goals Re-Evaluation:   Nutrition Goals Re-Evaluation:   Nutrition Goals  Discharge (Final Nutrition Goals Re-Evaluation):   Psychosocial: Target Goals: Acknowledge presence or absence of significant depression and/or stress, maximize coping skills, provide positive support system. Participant is able to verbalize types and ability to use techniques and skills needed for reducing stress and depression.  Initial Review & Psychosocial Screening:  Initial Psych Review & Screening - 10/10/22 1142       Initial Review   Current issues with None Identified      Family Dynamics   Good Support System? Yes   Pt has son and daughter for support     Barriers   Psychosocial barriers to participate in program There are no identifiable barriers or psychosocial needs.      Screening Interventions   Interventions Encouraged to exercise             Quality of Life Scores:  Quality of Life - 10/10/22 1406       Quality of Life   Select Quality of Life      Quality of Life Scores   Health/Function Pre 27.2 %    Socioeconomic Pre 30 %    Psych/Spiritual Pre 30 %    Family Pre 30 %    GLOBAL Pre 28.8 %            Scores of 19 and below usually indicate a poorer quality of life in these areas.  A difference of  2-3 points is a clinically meaningful difference.  A difference of 2-3 points in the total score of the Quality of Life Index has been associated with significant improvement in overall quality of life, self-image, physical symptoms, and general health in studies assessing change in quality of life.  PHQ-9: Review Flowsheet       10/10/2022 07/30/2022  Depression screen PHQ 2/9  Decreased Interest 0 0  Down, Depressed, Hopeless 0 0  PHQ - 2 Score 0 0  Altered sleeping 0 0  Tired, decreased energy 3 3  Change in appetite 0 0  Feeling bad or failure about yourself  0 0  Trouble concentrating 0 0  Moving slowly or fidgety/restless 0 0  Suicidal thoughts 0 0  PHQ-9 Score 3 3  Difficult doing work/chores Somewhat difficult Not difficult at all    Interpretation of Total Score  Total Score Depression Severity:  1-4 = Minimal depression, 5-9 = Mild depression, 10-14 = Moderate depression, 15-19 = Moderately severe depression, 20-27 = Severe depression   Psychosocial Evaluation and Intervention:   Psychosocial Re-Evaluation:   Psychosocial Discharge (Final Psychosocial Re-Evaluation):   Vocational Rehabilitation: Provide vocational rehab assistance to qualifying candidates.   Vocational Rehab Evaluation & Intervention:  Vocational Rehab - 10/10/22 1142       Initial Vocational Rehab Evaluation & Intervention   Assessment shows need for Vocational Rehabilitation No   Pt denies any vocational needs at this time            Education: Education Goals: Education classes will be provided  on a weekly basis, covering required topics. Participant will state understanding/return demonstration of topics presented.     Core Videos: Exercise    Move It!  Clinical staff conducted group or individual video education with verbal and written material and guidebook.  Patient learns the recommended Pritikin exercise program. Exercise with the goal of living a long, healthy life. Some of the health benefits of exercise include controlled diabetes, healthier blood pressure levels, improved cholesterol levels, improved heart and lung capacity, improved sleep, and better body composition. Everyone should speak with their doctor before starting or changing an exercise routine.  Biomechanical Limitations Clinical staff conducted group or individual video education with verbal and written material and guidebook.  Patient learns how biomechanical limitations can impact exercise and how we can mitigate and possibly overcome limitations to have an impactful and balanced exercise routine.  Body Composition Clinical staff conducted group or individual video education with verbal and written material and guidebook.  Patient learns that body  composition (ratio of muscle mass to fat mass) is a key component to assessing overall fitness, rather than body weight alone. Increased fat mass, especially visceral belly fat, can put [place] at increased risk for metabolic syndrome, type 2 diabetes, heart disease, and even death. It is recommended to combine diet and exercise (cardiovascular and resistance training) to improve your body composition. Seek guidance from your physician and exercise physiologist before implementing an exercise routine.  Exercise Action Plan Clinical staff conducted group or individual video education with verbal and written material and guidebook.  Patient learns the recommended strategies to achieve and enjoy long-term exercise adherence, including variety, self-motivation, self-efficacy, and positive decision making. Benefits of exercise include fitness, good health, weight management, more energy, better sleep, less stress, and overall well-being.  Medical   Heart Disease Risk Reduction Clinical staff conducted group or individual video education with verbal and written material and guidebook.  Patient learns our heart is our most vital organ as it circulates oxygen, nutrients, white blood cells, and hormones throughout the entire body, and carries waste away. Data supports a plant-based eating plan like the Pritikin Program for its effectiveness in slowing progression of and reversing heart disease. The video provides a number of recommendations to address heart disease.   Metabolic Syndrome and Belly Fat  Clinical staff conducted group or individual video education with verbal and written material and guidebook.  Patient learns what metabolic syndrome is, how it leads to heart disease, and how one can reverse it and keep it from coming back. You have metabolic syndrome if you have 3 of the following 5 criteria: abdominal obesity, high blood pressure, high triglycerides, low HDL cholesterol, and high blood  sugar.  Hypertension and Heart Disease Clinical staff conducted group or individual video education with verbal and written material and guidebook.  Patient learns that high blood pressure, or hypertension, is very common in the [place]. Hypertension is largely due to excessive salt intake, but other important risk factors include being overweight, physical inactivity, drinking too much alcohol, smoking, and not eating enough potassium from fruits and vegetables. High blood pressure is a leading risk factor for heart attack, stroke, congestive heart failure, dementia, kidney failure, and premature death. Long-term effects of excessive salt intake include stiffening of the arteries and thickening of heart muscle and organ damage. Recommendations include ways to reduce hypertension and the risk of heart disease.  Diseases of Our Time - Focusing on Diabetes Clinical staff conducted group or individual video education with  verbal and written material and guidebook.  Patient learns why the best way to stop diseases of our time is prevention, through food and other lifestyle changes. Medicine (such as prescription pills and surgeries) is often only a Band-Aid on the problem, not a long-term solution. Most common diseases of our time include obesity, type 2 diabetes, hypertension, heart disease, and cancer. The Pritikin Program is recommended and has been proven to help reduce, reverse, and/or prevent the damaging effects of metabolic syndrome.  Nutrition   Overview of the Pritikin Eating Plan  Clinical staff conducted group or individual video education with verbal and written material and guidebook.  Patient learns about the Pritikin Eating Plan for disease risk reduction. The Pritikin Eating Plan emphasizes a wide variety of unrefined, minimally-processed carbohydrates, like fruits, vegetables, whole grains, and legumes. Go, Caution, and Stop food choices are explained. Plant-based and lean animal  proteins are emphasized. Rationale provided for low sodium intake for blood pressure control, low added sugars for blood sugar stabilization, and low added fats and oils for coronary artery disease risk reduction and weight management.  Calorie Density  Clinical staff conducted group or individual video education with verbal and written material and guidebook.  Patient learns about calorie density and how it impacts the Pritikin Eating Plan. Knowing the characteristics of the food you choose will help you decide whether those foods will lead to weight gain or weight loss, and whether you want to consume more or less of them. Weight loss is usually a side effect of the Pritikin Eating Plan because of its focus on low calorie-dense foods.  Label Reading  Clinical staff conducted group or individual video education with verbal and written material and guidebook.  Patient learns about the Pritikin recommended label reading guidelines and corresponding recommendations regarding calorie density, added sugars, sodium content, and whole grains.  Dining Out - Part 1  Clinical staff conducted group or individual video education with verbal and written material and guidebook.  Patient learns that restaurant meals can be sabotaging because they can be so high in calories, fat, sodium, and/or sugar. Patient learns recommended strategies on how to positively address this and avoid unhealthy pitfalls.  Facts on Fats  Clinical staff conducted group or individual video education with verbal and written material and guidebook.  Patient learns that lifestyle modifications can be just as effective, if not more so, as many medications for lowering your risk of heart disease. A Pritikin lifestyle can help to reduce your risk of inflammation and atherosclerosis (cholesterol build-up, or plaque, in the artery walls). Lifestyle interventions such as dietary choices and physical activity address the cause of atherosclerosis.  A review of the types of fats and their impact on blood cholesterol levels, along with dietary recommendations to reduce fat intake is also included.  Nutrition Action Plan  Clinical staff conducted group or individual video education with verbal and written material and guidebook.  Patient learns how to incorporate Pritikin recommendations into their lifestyle. Recommendations include planning and keeping personal health goals in mind as an important part of their success.  Healthy Mind-Set    Healthy Minds, Bodies, Hearts  Clinical staff conducted group or individual video education with verbal and written material and guidebook.  Patient learns how to identify when they are stressed. Video will discuss the impact of that stress, as well as the many benefits of stress management. Patient will also be introduced to stress management techniques. The way we think, act, and feel has an impact  on our hearts.  How Our Thoughts Can Heal Our Hearts  Clinical staff conducted group or individual video education with verbal and written material and guidebook.  Patient learns that negative thoughts can cause depression and anxiety. This can result in negative lifestyle behavior and serious health problems. Cognitive behavioral therapy is an effective method to help control our thoughts in order to change and improve our emotional outlook.  Additional Videos:  Exercise    Improving Performance  Clinical staff conducted group or individual video education with verbal and written material and guidebook.  Patient learns to use a non-linear approach by alternating intensity levels and lengths of time spent exercising to help burn more calories and lose more body fat. Cardiovascular exercise helps improve heart health, metabolism, hormonal balance, blood sugar control, and recovery from fatigue. Resistance training improves strength, endurance, balance, coordination, reaction time, metabolism, and muscle mass.  Flexibility exercise improves circulation, posture, and balance. Seek guidance from your physician and exercise physiologist before implementing an exercise routine and learn your capabilities and proper form for all exercise.  Introduction to Yoga  Clinical staff conducted group or individual video education with verbal and written material and guidebook.  Patient learns about yoga, a discipline of the coming together of mind, breath, and body. The benefits of yoga include improved flexibility, improved range of motion, better posture and core strength, increased lung function, weight loss, and positive self-image. Yoga's heart health benefits include lowered blood pressure, healthier heart rate, decreased cholesterol and triglyceride levels, improved immune function, and reduced stress. Seek guidance from your physician and exercise physiologist before implementing an exercise routine and learn your capabilities and proper form for all exercise.  Medical   Aging: Enhancing Your Quality of Life  Clinical staff conducted group or individual video education with verbal and written material and guidebook.  Patient learns key strategies and recommendations to stay in good physical health and enhance quality of life, such as prevention strategies, having an advocate, securing a Health Care Proxy and Power of Attorney, and keeping a list of medications and system for tracking them. It also discusses how to avoid risk for bone loss.  Biology of Weight Control  Clinical staff conducted group or individual video education with verbal and written material and guidebook.  Patient learns that weight gain occurs because we consume more calories than we burn (eating more, moving less). Even if your body weight is normal, you may have higher ratios of fat compared to muscle mass. Too much body fat puts you at increased risk for cardiovascular disease, heart attack, stroke, type 2 diabetes, and obesity-related  cancers. In addition to exercise, following the Pritikin Eating Plan can help reduce your risk.  Decoding Lab Results  Clinical staff conducted group or individual video education with verbal and written material and guidebook.  Patient learns that lab test reflects one measurement whose values change over time and are influenced by many factors, including medication, stress, sleep, exercise, food, hydration, pre-existing medical conditions, and more. It is recommended to use the knowledge from this video to become more involved with your lab results and evaluate your numbers to speak with your doctor.   Diseases of Our Time - Overview  Clinical staff conducted group or individual video education with verbal and written material and guidebook.  Patient learns that according to the CDC, 50% to 70% of chronic diseases (such as obesity, type 2 diabetes, elevated lipids, hypertension, and heart disease) are avoidable through lifestyle improvements including healthier  food choices, listening to satiety cues, and increased physical activity.  Sleep Disorders Clinical staff conducted group or individual video education with verbal and written material and guidebook.  Patient learns how good quality and duration of sleep are important to overall health and well-being. Patient also learns about sleep disorders and how they impact health along with recommendations to address them, including discussing with a physician.  Nutrition  Dining Out - Part 2 Clinical staff conducted group or individual video education with verbal and written material and guidebook.  Patient learns how to plan ahead and communicate in order to maximize their dining experience in a healthy and nutritious manner. Included are recommended food choices based on the type of restaurant the patient is visiting.   Fueling a Banker conducted group or individual video education with verbal and written material and  guidebook.  There is a strong connection between our food choices and our health. Diseases like obesity and type 2 diabetes are very prevalent and are in large-part due to lifestyle choices. The Pritikin Eating Plan provides plenty of food and hunger-curbing satisfaction. It is easy to follow, affordable, and helps reduce health risks.  Menu Workshop  Clinical staff conducted group or individual video education with verbal and written material and guidebook.  Patient learns that restaurant meals can sabotage health goals because they are often packed with calories, fat, sodium, and sugar. Recommendations include strategies to plan ahead and to communicate with the manager, chef, or server to help order a healthier meal.  Planning Your Eating Strategy  Clinical staff conducted group or individual video education with verbal and written material and guidebook.  Patient learns about the Pritikin Eating Plan and its benefit of reducing the risk of disease. The Pritikin Eating Plan does not focus on calories. Instead, it emphasizes high-quality, nutrient-rich foods. By knowing the characteristics of the foods, we choose, we can determine their calorie density and make informed decisions.  Targeting Your Nutrition Priorities  Clinical staff conducted group or individual video education with verbal and written material and guidebook.  Patient learns that lifestyle habits have a tremendous impact on disease risk and progression. This video provides eating and physical activity recommendations based on your personal health goals, such as reducing LDL cholesterol, losing weight, preventing or controlling type 2 diabetes, and reducing high blood pressure.  Vitamins and Minerals  Clinical staff conducted group or individual video education with verbal and written material and guidebook.  Patient learns different ways to obtain key vitamins and minerals, including through a recommended healthy diet. It is  important to discuss all supplements you take with your doctor.   Healthy Mind-Set    Smoking Cessation  Clinical staff conducted group or individual video education with verbal and written material and guidebook.  Patient learns that cigarette smoking and tobacco addiction pose a serious health risk which affects millions of people. Stopping smoking will significantly reduce the risk of heart disease, lung disease, and many forms of cancer. Recommended strategies for quitting are covered, including working with your doctor to develop a successful plan.  Culinary   Becoming a Set designer conducted group or individual video education with verbal and written material and guidebook.  Patient learns that cooking at home can be healthy, cost-effective, quick, and puts them in control. Keys to cooking healthy recipes will include looking at your recipe, assessing your equipment needs, planning ahead, making it simple, choosing cost-effective seasonal ingredients, and limiting the use  of added fats, salts, and sugars.  Cooking - Breakfast and Snacks  Clinical staff conducted group or individual video education with verbal and written material and guidebook.  Patient learns how important breakfast is to satiety and nutrition through the entire day. Recommendations include key foods to eat during breakfast to help stabilize blood sugar levels and to prevent overeating at meals later in the day. Planning ahead is also a key component.  Cooking - Educational psychologist conducted group or individual video education with verbal and written material and guidebook.  Patient learns eating strategies to improve overall health, including an approach to cook more at home. Recommendations include thinking of animal protein as a side on your plate rather than center stage and focusing instead on lower calorie dense options like vegetables, fruits, whole grains, and plant-based proteins,  such as beans. Making sauces in large quantities to freeze for later and leaving the skin on your vegetables are also recommended to maximize your experience.  Cooking - Healthy Salads and Dressing Clinical staff conducted group or individual video education with verbal and written material and guidebook.  Patient learns that vegetables, fruits, whole grains, and legumes are the foundations of the Pritikin Eating Plan. Recommendations include how to incorporate each of these in flavorful and healthy salads, and how to create homemade salad dressings. Proper handling of ingredients is also covered. Cooking - Soups and [employer] - Soups and Desserts Clinical staff conducted group or individual video education with verbal and written material and guidebook.  Patient learns that Pritikin soups and desserts make for easy, nutritious, and delicious snacks and meal components that are low in sodium, fat, sugar, and calorie density, while high in vitamins, minerals, and filling fiber. Recommendations include simple and healthy ideas for soups and desserts.   Overview     The Pritikin Solution Program Overview Clinical staff conducted group or individual video education with verbal and written material and guidebook.  Patient learns that the results of the Pritikin Program have been documented in more than 100 articles published in peer-reviewed journals, and the benefits include reducing risk factors for (and, in some cases, even reversing) high cholesterol, high blood pressure, type 2 diabetes, obesity, and more! An overview of the three key pillars of the Pritikin Program will be covered: eating well, doing regular exercise, and having a healthy mind-set.  WORKSHOPS  Exercise: Exercise Basics: Building Your Action Plan Clinical staff led group instruction and group discussion with PowerPoint presentation and patient guidebook. To enhance the learning environment the use of posters, models and  videos may be added. At the conclusion of this workshop, patients will comprehend the difference between physical activity and exercise, as well as the benefits of incorporating both, into their routine. Patients will understand the FITT (Frequency, Intensity, Time, and Type) principle and how to use it to build an exercise action plan. In addition, safety concerns and other considerations for exercise and cardiac rehab will be addressed by the presenter. The purpose of this lesson is to promote a comprehensive and effective weekly exercise routine in order to improve patients' overall level of fitness.   Managing Heart Disease: Your Path to a Healthier Heart Clinical staff led group instruction and group discussion with PowerPoint presentation and patient guidebook. To enhance the learning environment the use of posters, models and videos may be added.At the conclusion of this workshop, patients will understand the anatomy and physiology of the heart. Additionally, they will understand how  Pritikin's three pillars impact the risk factors, the progression, and the management of heart disease.  The purpose of this lesson is to provide a high-level overview of the heart, heart disease, and how the Pritikin lifestyle positively impacts risk factors.  Exercise Biomechanics Clinical staff led group instruction and group discussion with PowerPoint presentation and patient guidebook. To enhance the learning environment the use of posters, models and videos may be added. Patients will learn how the structural parts of their bodies function and how these functions impact their daily activities, movement, and exercise. Patients will learn how to promote a neutral spine, learn how to manage pain, and identify ways to improve their physical movement in order to promote healthy living. The purpose of this lesson is to expose patients to common physical limitations that impact physical activity. Participants  will learn practical ways to adapt and manage aches and pains, and to minimize their effect on regular exercise. Patients will learn how to maintain good posture while sitting, walking, and lifting.  Balance Training and Fall Prevention  Clinical staff led group instruction and group discussion with PowerPoint presentation and patient guidebook. To enhance the learning environment the use of posters, models and videos may be added. At the conclusion of this workshop, patients will understand the importance of their sensorimotor skills (vision, proprioception, and the vestibular system) in maintaining their ability to balance as they age. Patients will apply a variety of balancing exercises that are appropriate for their current level of function. Patients will understand the common causes for poor balance, possible solutions to these problems, and ways to modify their physical environment in order to minimize their fall risk. The purpose of this lesson is to teach patients about the importance of maintaining balance as they age and ways to minimize their risk of falling.  WORKSHOPS   Nutrition:  Fueling a Ship broker led group instruction and group discussion with PowerPoint presentation and patient guidebook. To enhance the learning environment the use of posters, models and videos may be added. Patients will review the foundational principles of the Pritikin Eating Plan and understand what constitutes a serving size in each of the food groups. Patients will also learn Pritikin-friendly foods that are better choices when away from home and review make-ahead meal and snack options. Calorie density will be reviewed and applied to three nutrition priorities: weight maintenance, weight loss, and weight gain. The purpose of this lesson is to reinforce (in a group setting) the key concepts around what patients are recommended to eat and how to apply these guidelines when away from home by  planning and selecting Pritikin-friendly options. Patients will understand how calorie density may be adjusted for different weight management goals.  Mindful Eating  Clinical staff led group instruction and group discussion with PowerPoint presentation and patient guidebook. To enhance the learning environment the use of posters, models and videos may be added. Patients will briefly review the concepts of the Pritikin Eating Plan and the importance of low-calorie dense foods. The concept of mindful eating will be introduced as well as the importance of paying attention to internal hunger signals. Triggers for non-hunger eating and techniques for dealing with triggers will be explored. The purpose of this lesson is to provide patients with the opportunity to review the basic principles of the Pritikin Eating Plan, discuss the value of eating mindfully and how to measure internal cues of hunger and fullness using the Hunger Scale. Patients will also discuss reasons for non-hunger eating  and learn strategies to use for controlling emotional eating.  Targeting Your Nutrition Priorities Clinical staff led group instruction and group discussion with PowerPoint presentation and patient guidebook. To enhance the learning environment the use of posters, models and videos may be added. Patients will learn how to determine their genetic susceptibility to disease by reviewing their family history. Patients will gain insight into the importance of diet as part of an overall healthy lifestyle in mitigating the impact of genetics and other environmental insults. The purpose of this lesson is to provide patients with the opportunity to assess their personal nutrition priorities by looking at their family history, their own health history and current risk factors. Patients will also be able to discuss ways of prioritizing and modifying the Pritikin Eating Plan for their highest risk areas  Menu  Clinical staff led group  instruction and group discussion with PowerPoint presentation and patient guidebook. To enhance the learning environment the use of posters, models and videos may be added. Using menus brought in from [employer], or printed from [employer], patients will apply the Pritikin dining out guidelines that were presented in the [employer] video. Patients will also be able to practice these guidelines in a variety of provided scenarios. The purpose of this lesson is to provide patients with the opportunity to practice hands-on learning of the Pritikin Dining Out guidelines with actual menus and practice scenarios.  Label Reading Clinical staff led group instruction and group discussion with PowerPoint presentation and patient guidebook. To enhance the learning environment the use of posters, models and videos may be added. Patients will review and discuss the Pritikin label reading guidelines presented in Pritikin's Label Reading Educational series video. Using fool labels brought in from local grocery stores and markets, patients will apply the label reading guidelines and determine if the packaged food meet the Pritikin guidelines. The purpose of this lesson is to provide patients with the opportunity to review, discuss, and practice hands-on learning of the Pritikin Label Reading guidelines with actual packaged food labels. Cooking School  Pritikin's [employer] are designed to teach patients ways to prepare quick, simple, and affordable recipes at home. The importance of nutrition's role in chronic disease risk reduction is reflected in its emphasis in the overall Pritikin program. By learning how to prepare essential core Pritikin Eating Plan recipes, patients will increase control over what they eat; be able to customize the flavor of foods without the use of added salt, sugar, or fat; and improve the quality of the food they consume. By learning a set of core recipes  which are easily assembled, quickly prepared, and affordable, patients are more likely to prepare more healthy foods at home. These workshops focus on convenient breakfasts, simple entres, side dishes, and desserts which can be prepared with minimal effort and are consistent with nutrition recommendations for cardiovascular risk reduction. Cooking [employer] are taught by a Armed forces logistics/support/administrative officer (RD) who has been trained by the [employer]. The chef or RD has a clear understanding of the importance of minimizing - if not completely eliminating - added fat, sugar, and sodium in recipes. Throughout the series of Cooking School Workshop sessions, patients will learn about healthy ingredients and efficient methods of cooking to build confidence in their capability to prepare    Cooking School weekly topics:  Adding Flavor- Sodium-Free  Fast and Healthy Breakfasts  Powerhouse Plant-Based Proteins  Satisfying Salads and Dressings  Simple Sides and Sauces  International Cuisine-Spotlight on the [employer] Zones  Delicious Desserts  Savory Soups  [employer] - Meals in a Astronomer Appetizers and Snacks  Comforting Weekend Breakfasts  One-Pot Wonders   Fast [employer] Your Pritikin Plate  WORKSHOPS   Healthy Mindset (Psychosocial):  Focused Goals, Sustainable Changes Clinical staff led group instruction and group discussion with PowerPoint presentation and patient guidebook. To enhance the learning environment the use of posters, models and videos may be added. Patients will be able to apply effective goal setting strategies to establish at least one personal goal, and then take consistent, meaningful action toward that goal. They will learn to identify common barriers to achieving personal goals and develop strategies to overcome them. Patients will also gain an understanding of how our mind-set can impact our ability to achieve  goals and the importance of cultivating a positive and growth-oriented mind-set. The purpose of this lesson is to provide patients with a deeper understanding of how to set and achieve personal goals, as well as the tools and strategies needed to overcome common obstacles which may arise along the way.  From Head to Heart: The Power of a Healthy Outlook  Clinical staff led group instruction and group discussion with PowerPoint presentation and patient guidebook. To enhance the learning environment the use of posters, models and videos may be added. Patients will be able to recognize and describe the impact of emotions and mood on physical health. They will discover the importance of self-care and explore self-care practices which may work for them. Patients will also learn how to utilize the 4 C's to cultivate a healthier outlook and better manage stress and challenges. The purpose of this lesson is to demonstrate to patients how a healthy outlook is an essential part of maintaining good health, especially as they continue their cardiac rehab journey.  Healthy Sleep for a Healthy Heart Clinical staff led group instruction and group discussion with PowerPoint presentation and patient guidebook. To enhance the learning environment the use of posters, models and videos may be added. At the conclusion of this workshop, patients will be able to demonstrate knowledge of the importance of sleep to overall health, well-being, and quality of life. They will understand the symptoms of, and treatments for, common sleep disorders. Patients will also be able to identify daytime and nighttime behaviors which impact sleep, and they will be able to apply these tools to help manage sleep-related challenges. The purpose of this lesson is to provide patients with a general overview of sleep and outline the importance of quality sleep. Patients will learn about a few of the most common sleep disorders. Patients will also be  introduced to the concept of "sleep hygiene," and discover ways to self-manage certain sleeping problems through simple daily behavior changes. Finally, the workshop will motivate patients by clarifying the links between quality sleep and their goals of heart-healthy living.   Recognizing and Reducing Stress Clinical staff led group instruction and group discussion with PowerPoint presentation and patient guidebook. To enhance the learning environment the use of posters, models and videos may be added. At the conclusion of this workshop, patients will be able to understand the types of stress reactions, differentiate between acute and chronic stress, and recognize the impact that chronic stress has on their health. They will also be able to apply different coping mechanisms, such as reframing negative self-talk. Patients will have the opportunity to practice a variety of stress management techniques, such as  deep abdominal breathing, progressive muscle relaxation, and/or guided imagery.  The purpose of this lesson is to educate patients on the role of stress in their lives and to provide healthy techniques for coping with it.  Learning Barriers/Preferences:  Learning Barriers/Preferences - 10/10/22 1142       Learning Barriers/Preferences   Learning Barriers Sight   wears glasses   Learning Preferences Computer/Internet;Audio;Group Instruction;Individual Instruction;Pictoral;Skilled Demonstration;Verbal Instruction;Video;Written Material             Education Topics:  Knowledge Questionnaire Score:  Knowledge Questionnaire Score - 10/10/22 1346       Knowledge Questionnaire Score   Pre Score 19/24             Core Components/Risk Factors/Patient Goals at Admission:  Personal Goals and Risk Factors at Admission - 10/10/22 1344       Core Components/Risk Factors/Patient Goals on Admission   Hypertension Yes    Intervention Provide education on lifestyle modifcations including  regular physical activity/exercise, weight management, moderate sodium restriction and increased consumption of fresh fruit, vegetables, and low fat dairy, alcohol moderation, and smoking cessation.;Monitor prescription use compliance.    Expected Outcomes Short Term: Continued assessment and intervention until BP is < 140/52mm HG in hypertensive participants. < 130/69mm HG in hypertensive participants with diabetes, heart failure or chronic kidney disease.;Long Term: Maintenance of blood pressure at goal levels.    Lipids Yes    Intervention Provide education and support for participant on nutrition & aerobic/resistive exercise along with prescribed medications to achieve LDL 70mg , HDL >40mg .    Expected Outcomes Short Term: Participant states understanding of desired cholesterol values and is compliant with medications prescribed. Participant is following exercise prescription and nutrition guidelines.;Long Term: Cholesterol controlled with medications as prescribed, with individualized exercise RX and with personalized nutrition plan. Value goals: LDL < 70mg , HDL > 40 mg.             Core Components/Risk Factors/Patient Goals Review:    Core Components/Risk Factors/Patient Goals at Discharge (Final Review):    ITP Comments:  ITP Comments     Row Name 10/10/22 1138           ITP Comments Armanda Magic, MD: Medical Director.  Introduction to the [facility] / Intensive Cardiac Rehab.  Initial orientation packet reviewed with the patient.                Comments: Participant attended orientation for the cardiac rehabilitation program on  10/10/2022  to perform initial intake and exercise walk test. Patient introduced to the Pritikin Program education and orientation packet was reviewed. Completed 6-minute walk test, measurements, initial ITP, and exercise prescription. Vital signs stable. Telemetry: bradycardia - normal sinus rhythm, asymptomatic.   Service time was  from 10:30 to 12:45.

## 2022-10-10 NOTE — Progress Notes (Deleted)
Cardiac Individual Treatment Plan  Patient Details  Name: Marisa Fuller MRN: [id number] Date of Birth: 1960/07/04 Referring Provider:   Flowsheet Row CARDIAC REHAB PHASE II ORIENTATION from 10/10/2022 in [facility]  Referring Provider Jodelle Red, MD       Initial Encounter Date:  Flowsheet Row CARDIAC REHAB PHASE II ORIENTATION from 10/10/2022 in [facility]  Date 10/10/22       Visit Diagnosis: 07/18/22 ST elevation myocardial infarction (STEMI)  07/18/22 OM Status post coronary artery stent placement  Patient's Home Medications on Admission:  Current Outpatient Medications:    aspirin EC 81 MG tablet, Take 1 tablet (81 mg total) by mouth daily. Swallow whole., Disp: , Rfl:    atorvastatin (LIPITOR) 40 MG tablet, Take 1 tablet (40 mg total) by mouth daily., Disp: 90 tablet, Rfl: 3   cetirizine (ZYRTEC) 10 MG tablet, Take 10 mg by mouth at bedtime., Disp: , Rfl:    fluticasone (FLONASE) 50 MCG/ACT nasal spray, Place 1 spray into both nostrils daily., Disp: 18.2 mL, Rfl: 2   losartan (COZAAR) 50 MG tablet, Take 1 tablet (50 mg total) by mouth daily., Disp: 30 tablet, Rfl: 5   metoprolol succinate (TOPROL XL) 25 MG 24 hr tablet, Take 1 tablet (25 mg total) by mouth daily., Disp: 30 tablet, Rfl: 5   Multiple Vitamins-Minerals (MULTIVITAMIN WOMEN PO), Take 1 tablet by mouth daily., Disp: , Rfl:    nitroGLYCERIN (NITROSTAT) 0.4 MG SL tablet, Place 1 tablet (0.4 mg total) under the tongue every 5 (five) minutes as needed for chest pain., Disp: 25 tablet, Rfl: 1   Study - EVOLVE-MI - evolocumab (REPATHA) 140 mg/mL SQ injection (PI-Stuckey), Inject 1 mL (140 mg total) into the skin every 14 (fourteen) days. Inject 1 mL (140 mg) subcutaneously into abdomen, thigh, or upper arm every 14 days. Rotate injection sites and do not inject into areas where skin is tender,  bruised, or red. Please contact [facility] for any questions or concerns regarding this medication., Disp: 12 mL, Rfl: 0   ticagrelor (BRILINTA) 90 MG TABS tablet, Take 1 tablet (90 mg total) by mouth 2 (two) times daily., Disp: 60 tablet, Rfl: 11  Current Facility-Administered Medications:    Study - EVOLVE-MI - evolocumab (REPATHA) 140 mg/mL SQ injection (PI-Stuckey), 140 mg, Subcutaneous, Q14 Days, Herby Abraham, MD, 140 mg at 07/24/22 1307  Past Medical History: Past Medical History:  Diagnosis Date   Allergy    Anemia    sickle cell trait   CAD (coronary artery disease) 07/18/2022   DES to first obtuse marginal   Clotting disorder    DVT (deep venous thrombosis)    Remote DVT per notes   GERD (gastroesophageal reflux disease)    Hyperlipidemia    Hypertension    Sickle cell anemia    trait    Tobacco Use: Social History   Tobacco Use  Smoking Status Former  Smokeless Tobacco Never    Labs: Review Flowsheet  More data exists      Latest Ref Rng & Units 08/31/2019 07/18/2022 07/19/2022 07/20/2022 09/24/2022  Labs for ITP Cardiac and Pulmonary Rehab  Cholestrol 100 - 199 mg/dL 782  - 956  213  85   LDL (calc) 0 - 99 mg/dL 086  - 578  469  16   HDL-C >39 mg/dL 53  - 52  45  53  Trlycerides 0 - 149 mg/dL 161  - 096  045  77   Hemoglobin A1c 4.8 - 5.6 % - 6.3  - - -  TCO2 22 - 32 mmol/L - 20  - - -    Capillary Blood Glucose: No results found for: "GLUCAP"   Exercise Target Goals: Exercise Program Goal: Individual exercise prescription set using results from initial 6 min walk test and THRR while considering  patient's activity barriers and safety.   Exercise Prescription Goal: Initial exercise prescription builds to 30-45 minutes a day of aerobic activity, 2-3 days per week.  Home exercise guidelines will be given to patient during program as part of exercise prescription that the participant will acknowledge.  Activity Barriers & Risk  Stratification:  Activity Barriers & Cardiac Risk Stratification - 10/10/22 1405       Activity Barriers & Cardiac Risk Stratification   Activity Barriers Deconditioning;Shortness of Breath    Cardiac Risk Stratification High             6 Minute Walk:  6 Minute Walk     Row Name 10/10/22 1212         6 Minute Walk   Phase Initial     Distance 1184 feet     Walk Time 6 minutes     # of Rest Breaks 0     MPH 2.24     METS 3.21     RPE 8     Perceived Dyspnea  1     VO2 Peak 11.22     Symptoms Yes (comment)     Comments SOB, RPD = 1, Fatigue     Resting HR 56 bpm     Resting BP 126/70     Resting Oxygen Saturation  100 %     Exercise Oxygen Saturation  during 6 min walk 100 %     Max Ex. HR 84 bpm     Max Ex. BP 132/78     2 Minute Post BP 120/70              Oxygen Initial Assessment:   Oxygen Re-Evaluation:   Oxygen Discharge (Final Oxygen Re-Evaluation):   Initial Exercise Prescription:  Initial Exercise Prescription - 10/10/22 1400       Date of Initial Exercise RX and Referring Provider   Date 10/10/22    Referring Provider bridgette Cristal Deer, MD    Expected Discharge Date 12/20/22      Bike   Level 1    Watts 25    Minutes 15    METs 3.2      NuStep   Level 1    SPM 75    Minutes 15    METs 3.2      Prescription Details   Frequency (times per week) 3    Duration Progress to 30 minutes of continuous aerobic without signs/symptoms of physical distress      Intensity   THRR 40-80% of Max Heartrate 62-123    Ratings of Perceived Exertion 11-13    Perceived Dyspnea 0-4      Progression   Progression Continue progressive overload as per policy without signs/symptoms or physical distress.      Resistance Training   Training Prescription Yes    Weight 3 lbs    Reps 10-15             Perform Capillary Blood Glucose checks as needed.  Exercise Prescription Changes:   Exercise Comments:   Exercise Goals  and  Review:   Exercise Goals     Row Name 10/10/22 1404             Exercise Goals   Increase Physical Activity Yes       Intervention Provide advice, education, support and counseling about physical activity/exercise needs.;Develop an individualized exercise prescription for aerobic and resistive training based on initial evaluation findings, risk stratification, comorbidities and participant's personal goals.       Expected Outcomes Short Term: Attend rehab on a regular basis to increase amount of physical activity.;Long Term: Add in home exercise to make exercise part of routine and to increase amount of physical activity.;Long Term: Exercising regularly at least 3-5 days a week.       Increase Strength and Stamina Yes       Intervention Provide advice, education, support and counseling about physical activity/exercise needs.;Develop an individualized exercise prescription for aerobic and resistive training based on initial evaluation findings, risk stratification, comorbidities and participant's personal goals.       Expected Outcomes Short Term: Increase workloads from initial exercise prescription for resistance, speed, and METs.;Short Term: Perform resistance training exercises routinely during rehab and add in resistance training at home;Long Term: Improve cardiorespiratory fitness, muscular endurance and strength as measured by increased METs and functional capacity ( )       Able to understand and use rate of perceived exertion (RPE) scale Yes       Intervention Provide education and explanation on how to use RPE scale       Expected Outcomes Short Term: Able to use RPE daily in rehab to express subjective intensity level;Long Term:  Able to use RPE to guide intensity level when exercising independently       Knowledge and understanding of Target Heart Rate Range (THRR) Yes       Intervention Provide education and explanation of THRR including how the numbers were predicted and where  they are located for reference       Expected Outcomes Short Term: Able to state/look up THRR;Long Term: Able to use THRR to govern intensity when exercising independently;Short Term: Able to use daily as guideline for intensity in rehab       Understanding of Exercise Prescription Yes       Intervention Provide education, explanation, and written materials on patient's individual exercise prescription       Expected Outcomes Short Term: Able to explain program exercise prescription;Long Term: Able to explain home exercise prescription to exercise independently                Exercise Goals Re-Evaluation :   Discharge Exercise Prescription (Final Exercise Prescription Changes):   Nutrition:  Target Goals: Understanding of nutrition guidelines, daily intake of sodium 1500mg , cholesterol 200mg , calories 30% from fat and 7% or less from saturated fats, daily to have 5 or more servings of fruits and vegetables.  Biometrics:  Pre Biometrics - 10/10/22 1124       Pre Biometrics   Waist Circumference 35 inches    Hip Circumference 38 inches    Waist to Hip Ratio 0.92 %    Triceps Skinfold 16 mm    % Body Fat 33.2 %    Grip Strength 41 kg    Flexibility 13 in    Single Leg Stand 30 seconds              Nutrition Therapy Plan and Nutrition Goals:   Nutrition Assessments:  MEDIFICTS Score Key: ?70 Need to  make dietary changes  40-70 Heart Healthy Diet ? 40 Therapeutic Level Cholesterol Diet    Picture Your Plate Scores: <08 Unhealthy dietary pattern with much room for improvement. 41-50 Dietary pattern unlikely to meet recommendations for good health and room for improvement. 51-60 More healthful dietary pattern, with some room for improvement.  >60 Healthy dietary pattern, although there may be some specific behaviors that could be improved.    Nutrition Goals Re-Evaluation:   Nutrition Goals Re-Evaluation:   Nutrition Goals Discharge (Final Nutrition Goals  Re-Evaluation):   Psychosocial: Target Goals: Acknowledge presence or absence of significant depression and/or stress, maximize coping skills, provide positive support system. Participant is able to verbalize types and ability to use techniques and skills needed for reducing stress and depression.  Initial Review & Psychosocial Screening:  Initial Psych Review & Screening - 10/10/22 1142       Initial Review   Current issues with None Identified      Family Dynamics   Good Support System? Yes   Pt has son and daughter for support     Barriers   Psychosocial barriers to participate in program There are no identifiable barriers or psychosocial needs.      Screening Interventions   Interventions Encouraged to exercise             Quality of Life Scores:  Quality of Life - 10/10/22 1406       Quality of Life   Select Quality of Life      Quality of Life Scores   Health/Function Pre 27.2 %    Socioeconomic Pre 30 %    Psych/Spiritual Pre 30 %    Family Pre 30 %    GLOBAL Pre 28.8 %            Scores of 19 and below usually indicate a poorer quality of life in these areas.  A difference of  2-3 points is a clinically meaningful difference.  A difference of 2-3 points in the total score of the Quality of Life Index has been associated with significant improvement in overall quality of life, self-image, physical symptoms, and general health in studies assessing change in quality of life.  PHQ-9: Review Flowsheet       10/10/2022 07/30/2022  Depression screen PHQ 2/9  Decreased Interest 0 0  Down, Depressed, Hopeless 0 0  PHQ - 2 Score 0 0  Altered sleeping 0 0  Tired, decreased energy 3 3  Change in appetite 0 0  Feeling bad or failure about yourself  0 0  Trouble concentrating 0 0  Moving slowly or fidgety/restless 0 0  Suicidal thoughts 0 0  PHQ-9 Score 3 3  Difficult doing work/chores Somewhat difficult Not difficult at all   Interpretation of Total Score   Total Score Depression Severity:  1-4 = Minimal depression, 5-9 = Mild depression, 10-14 = Moderate depression, 15-19 = Moderately severe depression, 20-27 = Severe depression   Psychosocial Evaluation and Intervention:   Psychosocial Re-Evaluation:   Psychosocial Discharge (Final Psychosocial Re-Evaluation):   Vocational Rehabilitation: Provide vocational rehab assistance to qualifying candidates.   Vocational Rehab Evaluation & Intervention:  Vocational Rehab - 10/10/22 1142       Initial Vocational Rehab Evaluation & Intervention   Assessment shows need for Vocational Rehabilitation No   Pt denies any vocational needs at this time            Education: Education Goals: Education classes will be provided on a weekly basis,  covering required topics. Participant will state understanding/return demonstration of topics presented.     Core Videos: Exercise    Move It!  Clinical staff conducted group or individual video education with verbal and written material and guidebook.  Patient learns the recommended Pritikin exercise program. Exercise with the goal of living a long, healthy life. Some of the health benefits of exercise include controlled diabetes, healthier blood pressure levels, improved cholesterol levels, improved heart and lung capacity, improved sleep, and better body composition. Everyone should speak with their doctor before starting or changing an exercise routine.  Biomechanical Limitations Clinical staff conducted group or individual video education with verbal and written material and guidebook.  Patient learns how biomechanical limitations can impact exercise and how we can mitigate and possibly overcome limitations to have an impactful and balanced exercise routine.  Body Composition Clinical staff conducted group or individual video education with verbal and written material and guidebook.  Patient learns that body composition (ratio of muscle mass to  fat mass) is a key component to assessing overall fitness, rather than body weight alone. Increased fat mass, especially visceral belly fat, can put [place] at increased risk for metabolic syndrome, type 2 diabetes, heart disease, and even death. It is recommended to combine diet and exercise (cardiovascular and resistance training) to improve your body composition. Seek guidance from your physician and exercise physiologist before implementing an exercise routine.  Exercise Action Plan Clinical staff conducted group or individual video education with verbal and written material and guidebook.  Patient learns the recommended strategies to achieve and enjoy long-term exercise adherence, including variety, self-motivation, self-efficacy, and positive decision making. Benefits of exercise include fitness, good health, weight management, more energy, better sleep, less stress, and overall well-being.  Medical   Heart Disease Risk Reduction Clinical staff conducted group or individual video education with verbal and written material and guidebook.  Patient learns our heart is our most vital organ as it circulates oxygen, nutrients, white blood cells, and hormones throughout the entire body, and carries waste away. Data supports a plant-based eating plan like the Pritikin Program for its effectiveness in slowing progression of and reversing heart disease. The video provides a number of recommendations to address heart disease.   Metabolic Syndrome and Belly Fat  Clinical staff conducted group or individual video education with verbal and written material and guidebook.  Patient learns what metabolic syndrome is, how it leads to heart disease, and how one can reverse it and keep it from coming back. You have metabolic syndrome if you have 3 of the following 5 criteria: abdominal obesity, high blood pressure, high triglycerides, low HDL cholesterol, and high blood sugar.  Hypertension and Heart Disease Clinical  staff conducted group or individual video education with verbal and written material and guidebook.  Patient learns that high blood pressure, or hypertension, is very common in the [place]. Hypertension is largely due to excessive salt intake, but other important risk factors include being overweight, physical inactivity, drinking too much alcohol, smoking, and not eating enough potassium from fruits and vegetables. High blood pressure is a leading risk factor for heart attack, stroke, congestive heart failure, dementia, kidney failure, and premature death. Long-term effects of excessive salt intake include stiffening of the arteries and thickening of heart muscle and organ damage. Recommendations include ways to reduce hypertension and the risk of heart disease.  Diseases of Our Time - Focusing on Diabetes Clinical staff conducted group or individual video education with verbal and written material  and guidebook.  Patient learns why the best way to stop diseases of our time is prevention, through food and other lifestyle changes. Medicine (such as prescription pills and surgeries) is often only a Band-Aid on the problem, not a long-term solution. Most common diseases of our time include obesity, type 2 diabetes, hypertension, heart disease, and cancer. The Pritikin Program is recommended and has been proven to help reduce, reverse, and/or prevent the damaging effects of metabolic syndrome.  Nutrition   Overview of the Pritikin Eating Plan  Clinical staff conducted group or individual video education with verbal and written material and guidebook.  Patient learns about the Pritikin Eating Plan for disease risk reduction. The Pritikin Eating Plan emphasizes a wide variety of unrefined, minimally-processed carbohydrates, like fruits, vegetables, whole grains, and legumes. Go, Caution, and Stop food choices are explained. Plant-based and lean animal proteins are emphasized. Rationale provided for low  sodium intake for blood pressure control, low added sugars for blood sugar stabilization, and low added fats and oils for coronary artery disease risk reduction and weight management.  Calorie Density  Clinical staff conducted group or individual video education with verbal and written material and guidebook.  Patient learns about calorie density and how it impacts the Pritikin Eating Plan. Knowing the characteristics of the food you choose will help you decide whether those foods will lead to weight gain or weight loss, and whether you want to consume more or less of them. Weight loss is usually a side effect of the Pritikin Eating Plan because of its focus on low calorie-dense foods.  Label Reading  Clinical staff conducted group or individual video education with verbal and written material and guidebook.  Patient learns about the Pritikin recommended label reading guidelines and corresponding recommendations regarding calorie density, added sugars, sodium content, and whole grains.  Dining Out - Part 1  Clinical staff conducted group or individual video education with verbal and written material and guidebook.  Patient learns that restaurant meals can be sabotaging because they can be so high in calories, fat, sodium, and/or sugar. Patient learns recommended strategies on how to positively address this and avoid unhealthy pitfalls.  Facts on Fats  Clinical staff conducted group or individual video education with verbal and written material and guidebook.  Patient learns that lifestyle modifications can be just as effective, if not more so, as many medications for lowering your risk of heart disease. A Pritikin lifestyle can help to reduce your risk of inflammation and atherosclerosis (cholesterol build-up, or plaque, in the artery walls). Lifestyle interventions such as dietary choices and physical activity address the cause of atherosclerosis. A review of the types of fats and their impact on  blood cholesterol levels, along with dietary recommendations to reduce fat intake is also included.  Nutrition Action Plan  Clinical staff conducted group or individual video education with verbal and written material and guidebook.  Patient learns how to incorporate Pritikin recommendations into their lifestyle. Recommendations include planning and keeping personal health goals in mind as an important part of their success.  Healthy Mind-Set    Healthy Minds, Bodies, Hearts  Clinical staff conducted group or individual video education with verbal and written material and guidebook.  Patient learns how to identify when they are stressed. Video will discuss the impact of that stress, as well as the many benefits of stress management. Patient will also be introduced to stress management techniques. The way we think, act, and feel has an impact on our hearts.  How Our Thoughts Can Heal Our Hearts  Clinical staff conducted group or individual video education with verbal and written material and guidebook.  Patient learns that negative thoughts can cause depression and anxiety. This can result in negative lifestyle behavior and serious health problems. Cognitive behavioral therapy is an effective method to help control our thoughts in order to change and improve our emotional outlook.  Additional Videos:  Exercise    Improving Performance  Clinical staff conducted group or individual video education with verbal and written material and guidebook.  Patient learns to use a non-linear approach by alternating intensity levels and lengths of time spent exercising to help burn more calories and lose more body fat. Cardiovascular exercise helps improve heart health, metabolism, hormonal balance, blood sugar control, and recovery from fatigue. Resistance training improves strength, endurance, balance, coordination, reaction time, metabolism, and muscle mass. Flexibility exercise improves circulation, posture,  and balance. Seek guidance from your physician and exercise physiologist before implementing an exercise routine and learn your capabilities and proper form for all exercise.  Introduction to Yoga  Clinical staff conducted group or individual video education with verbal and written material and guidebook.  Patient learns about yoga, a discipline of the coming together of mind, breath, and body. The benefits of yoga include improved flexibility, improved range of motion, better posture and core strength, increased lung function, weight loss, and positive self-image. Yoga's heart health benefits include lowered blood pressure, healthier heart rate, decreased cholesterol and triglyceride levels, improved immune function, and reduced stress. Seek guidance from your physician and exercise physiologist before implementing an exercise routine and learn your capabilities and proper form for all exercise.  Medical   Aging: Enhancing Your Quality of Life  Clinical staff conducted group or individual video education with verbal and written material and guidebook.  Patient learns key strategies and recommendations to stay in good physical health and enhance quality of life, such as prevention strategies, having an advocate, securing a Health Care Proxy and Power of Attorney, and keeping a list of medications and system for tracking them. It also discusses how to avoid risk for bone loss.  Biology of Weight Control  Clinical staff conducted group or individual video education with verbal and written material and guidebook.  Patient learns that weight gain occurs because we consume more calories than we burn (eating more, moving less). Even if your body weight is normal, you may have higher ratios of fat compared to muscle mass. Too much body fat puts you at increased risk for cardiovascular disease, heart attack, stroke, type 2 diabetes, and obesity-related cancers. In addition to exercise, following the Pritikin  Eating Plan can help reduce your risk.  Decoding Lab Results  Clinical staff conducted group or individual video education with verbal and written material and guidebook.  Patient learns that lab test reflects one measurement whose values change over time and are influenced by many factors, including medication, stress, sleep, exercise, food, hydration, pre-existing medical conditions, and more. It is recommended to use the knowledge from this video to become more involved with your lab results and evaluate your numbers to speak with your doctor.   Diseases of Our Time - Overview  Clinical staff conducted group or individual video education with verbal and written material and guidebook.  Patient learns that according to the CDC, 50% to 70% of chronic diseases (such as obesity, type 2 diabetes, elevated lipids, hypertension, and heart disease) are avoidable through lifestyle improvements including healthier food choices, listening to  satiety cues, and increased physical activity.  Sleep Disorders Clinical staff conducted group or individual video education with verbal and written material and guidebook.  Patient learns how good quality and duration of sleep are important to overall health and well-being. Patient also learns about sleep disorders and how they impact health along with recommendations to address them, including discussing with a physician.  Nutrition  Dining Out - Part 2 Clinical staff conducted group or individual video education with verbal and written material and guidebook.  Patient learns how to plan ahead and communicate in order to maximize their dining experience in a healthy and nutritious manner. Included are recommended food choices based on the type of restaurant the patient is visiting.   Fueling a Banker conducted group or individual video education with verbal and written material and guidebook.  There is a strong connection between our food  choices and our health. Diseases like obesity and type 2 diabetes are very prevalent and are in large-part due to lifestyle choices. The Pritikin Eating Plan provides plenty of food and hunger-curbing satisfaction. It is easy to follow, affordable, and helps reduce health risks.  Menu Workshop  Clinical staff conducted group or individual video education with verbal and written material and guidebook.  Patient learns that restaurant meals can sabotage health goals because they are often packed with calories, fat, sodium, and sugar. Recommendations include strategies to plan ahead and to communicate with the manager, chef, or server to help order a healthier meal.  Planning Your Eating Strategy  Clinical staff conducted group or individual video education with verbal and written material and guidebook.  Patient learns about the Pritikin Eating Plan and its benefit of reducing the risk of disease. The Pritikin Eating Plan does not focus on calories. Instead, it emphasizes high-quality, nutrient-rich foods. By knowing the characteristics of the foods, we choose, we can determine their calorie density and make informed decisions.  Targeting Your Nutrition Priorities  Clinical staff conducted group or individual video education with verbal and written material and guidebook.  Patient learns that lifestyle habits have a tremendous impact on disease risk and progression. This video provides eating and physical activity recommendations based on your personal health goals, such as reducing LDL cholesterol, losing weight, preventing or controlling type 2 diabetes, and reducing high blood pressure.  Vitamins and Minerals  Clinical staff conducted group or individual video education with verbal and written material and guidebook.  Patient learns different ways to obtain key vitamins and minerals, including through a recommended healthy diet. It is important to discuss all supplements you take with your  doctor.   Healthy Mind-Set    Smoking Cessation  Clinical staff conducted group or individual video education with verbal and written material and guidebook.  Patient learns that cigarette smoking and tobacco addiction pose a serious health risk which affects millions of people. Stopping smoking will significantly reduce the risk of heart disease, lung disease, and many forms of cancer. Recommended strategies for quitting are covered, including working with your doctor to develop a successful plan.  Culinary   Becoming a Set designer conducted group or individual video education with verbal and written material and guidebook.  Patient learns that cooking at home can be healthy, cost-effective, quick, and puts them in control. Keys to cooking healthy recipes will include looking at your recipe, assessing your equipment needs, planning ahead, making it simple, choosing cost-effective seasonal ingredients, and limiting the use of added fats, salts,  and sugars.  Cooking - Breakfast and Snacks  Clinical staff conducted group or individual video education with verbal and written material and guidebook.  Patient learns how important breakfast is to satiety and nutrition through the entire day. Recommendations include key foods to eat during breakfast to help stabilize blood sugar levels and to prevent overeating at meals later in the day. Planning ahead is also a key component.  Cooking - Educational psychologist conducted group or individual video education with verbal and written material and guidebook.  Patient learns eating strategies to improve overall health, including an approach to cook more at home. Recommendations include thinking of animal protein as a side on your plate rather than center stage and focusing instead on lower calorie dense options like vegetables, fruits, whole grains, and plant-based proteins, such as beans. Making sauces in large quantities to freeze  for later and leaving the skin on your vegetables are also recommended to maximize your experience.  Cooking - Healthy Salads and Dressing Clinical staff conducted group or individual video education with verbal and written material and guidebook.  Patient learns that vegetables, fruits, whole grains, and legumes are the foundations of the Pritikin Eating Plan. Recommendations include how to incorporate each of these in flavorful and healthy salads, and how to create homemade salad dressings. Proper handling of ingredients is also covered. Cooking - Soups and [employer] - Soups and Desserts Clinical staff conducted group or individual video education with verbal and written material and guidebook.  Patient learns that Pritikin soups and desserts make for easy, nutritious, and delicious snacks and meal components that are low in sodium, fat, sugar, and calorie density, while high in vitamins, minerals, and filling fiber. Recommendations include simple and healthy ideas for soups and desserts.   Overview     The Pritikin Solution Program Overview Clinical staff conducted group or individual video education with verbal and written material and guidebook.  Patient learns that the results of the Pritikin Program have been documented in more than 100 articles published in peer-reviewed journals, and the benefits include reducing risk factors for (and, in some cases, even reversing) high cholesterol, high blood pressure, type 2 diabetes, obesity, and more! An overview of the three key pillars of the Pritikin Program will be covered: eating well, doing regular exercise, and having a healthy mind-set.  WORKSHOPS  Exercise: Exercise Basics: Building Your Action Plan Clinical staff led group instruction and group discussion with PowerPoint presentation and patient guidebook. To enhance the learning environment the use of posters, models and videos may be added. At the conclusion of this workshop,  patients will comprehend the difference between physical activity and exercise, as well as the benefits of incorporating both, into their routine. Patients will understand the FITT (Frequency, Intensity, Time, and Type) principle and how to use it to build an exercise action plan. In addition, safety concerns and other considerations for exercise and cardiac rehab will be addressed by the presenter. The purpose of this lesson is to promote a comprehensive and effective weekly exercise routine in order to improve patients' overall level of fitness.   Managing Heart Disease: Your Path to a Healthier Heart Clinical staff led group instruction and group discussion with PowerPoint presentation and patient guidebook. To enhance the learning environment the use of posters, models and videos may be added.At the conclusion of this workshop, patients will understand the anatomy and physiology of the heart. Additionally, they will understand how Pritikin's three pillars impact  the risk factors, the progression, and the management of heart disease.  The purpose of this lesson is to provide a high-level overview of the heart, heart disease, and how the Pritikin lifestyle positively impacts risk factors.  Exercise Biomechanics Clinical staff led group instruction and group discussion with PowerPoint presentation and patient guidebook. To enhance the learning environment the use of posters, models and videos may be added. Patients will learn how the structural parts of their bodies function and how these functions impact their daily activities, movement, and exercise. Patients will learn how to promote a neutral spine, learn how to manage pain, and identify ways to improve their physical movement in order to promote healthy living. The purpose of this lesson is to expose patients to common physical limitations that impact physical activity. Participants will learn practical ways to adapt and manage aches and  pains, and to minimize their effect on regular exercise. Patients will learn how to maintain good posture while sitting, walking, and lifting.  Balance Training and Fall Prevention  Clinical staff led group instruction and group discussion with PowerPoint presentation and patient guidebook. To enhance the learning environment the use of posters, models and videos may be added. At the conclusion of this workshop, patients will understand the importance of their sensorimotor skills (vision, proprioception, and the vestibular system) in maintaining their ability to balance as they age. Patients will apply a variety of balancing exercises that are appropriate for their current level of function. Patients will understand the common causes for poor balance, possible solutions to these problems, and ways to modify their physical environment in order to minimize their fall risk. The purpose of this lesson is to teach patients about the importance of maintaining balance as they age and ways to minimize their risk of falling.  WORKSHOPS   Nutrition:  Fueling a Ship broker led group instruction and group discussion with PowerPoint presentation and patient guidebook. To enhance the learning environment the use of posters, models and videos may be added. Patients will review the foundational principles of the Pritikin Eating Plan and understand what constitutes a serving size in each of the food groups. Patients will also learn Pritikin-friendly foods that are better choices when away from home and review make-ahead meal and snack options. Calorie density will be reviewed and applied to three nutrition priorities: weight maintenance, weight loss, and weight gain. The purpose of this lesson is to reinforce (in a group setting) the key concepts around what patients are recommended to eat and how to apply these guidelines when away from home by planning and selecting Pritikin-friendly options.  Patients will understand how calorie density may be adjusted for different weight management goals.  Mindful Eating  Clinical staff led group instruction and group discussion with PowerPoint presentation and patient guidebook. To enhance the learning environment the use of posters, models and videos may be added. Patients will briefly review the concepts of the Pritikin Eating Plan and the importance of low-calorie dense foods. The concept of mindful eating will be introduced as well as the importance of paying attention to internal hunger signals. Triggers for non-hunger eating and techniques for dealing with triggers will be explored. The purpose of this lesson is to provide patients with the opportunity to review the basic principles of the Pritikin Eating Plan, discuss the value of eating mindfully and how to measure internal cues of hunger and fullness using the Hunger Scale. Patients will also discuss reasons for non-hunger eating and learn strategies to  use for controlling emotional eating.  Targeting Your Nutrition Priorities Clinical staff led group instruction and group discussion with PowerPoint presentation and patient guidebook. To enhance the learning environment the use of posters, models and videos may be added. Patients will learn how to determine their genetic susceptibility to disease by reviewing their family history. Patients will gain insight into the importance of diet as part of an overall healthy lifestyle in mitigating the impact of genetics and other environmental insults. The purpose of this lesson is to provide patients with the opportunity to assess their personal nutrition priorities by looking at their family history, their own health history and current risk factors. Patients will also be able to discuss ways of prioritizing and modifying the Pritikin Eating Plan for their highest risk areas  Menu  Clinical staff led group instruction and group discussion with PowerPoint  presentation and patient guidebook. To enhance the learning environment the use of posters, models and videos may be added. Using menus brought in from [employer], or printed from [employer], patients will apply the Pritikin dining out guidelines that were presented in the [employer] video. Patients will also be able to practice these guidelines in a variety of provided scenarios. The purpose of this lesson is to provide patients with the opportunity to practice hands-on learning of the Pritikin Dining Out guidelines with actual menus and practice scenarios.  Label Reading Clinical staff led group instruction and group discussion with PowerPoint presentation and patient guidebook. To enhance the learning environment the use of posters, models and videos may be added. Patients will review and discuss the Pritikin label reading guidelines presented in Pritikin's Label Reading Educational series video. Using fool labels brought in from local grocery stores and markets, patients will apply the label reading guidelines and determine if the packaged food meet the Pritikin guidelines. The purpose of this lesson is to provide patients with the opportunity to review, discuss, and practice hands-on learning of the Pritikin Label Reading guidelines with actual packaged food labels. Cooking School  Pritikin's [employer] are designed to teach patients ways to prepare quick, simple, and affordable recipes at home. The importance of nutrition's role in chronic disease risk reduction is reflected in its emphasis in the overall Pritikin program. By learning how to prepare essential core Pritikin Eating Plan recipes, patients will increase control over what they eat; be able to customize the flavor of foods without the use of added salt, sugar, or fat; and improve the quality of the food they consume. By learning a set of core recipes which are easily assembled, quickly prepared, and  affordable, patients are more likely to prepare more healthy foods at home. These workshops focus on convenient breakfasts, simple entres, side dishes, and desserts which can be prepared with minimal effort and are consistent with nutrition recommendations for cardiovascular risk reduction. Cooking [employer] are taught by a Armed forces logistics/support/administrative officer (RD) who has been trained by the [employer]. The chef or RD has a clear understanding of the importance of minimizing - if not completely eliminating - added fat, sugar, and sodium in recipes. Throughout the series of Cooking School Workshop sessions, patients will learn about healthy ingredients and efficient methods of cooking to build confidence in their capability to prepare    Cooking School weekly topics:  Adding Flavor- Sodium-Free  Fast and Healthy Breakfasts  Powerhouse Plant-Based Proteins  Satisfying Salads and Dressings  Simple Sides and Sauces  International Cuisine-Spotlight on  the Blue Zones  Delicious Desserts  Savory Soups  [employer] - Meals in a Snap  Tasty Appetizers and Snacks  Comforting Weekend Breakfasts  One-Pot Wonders   Fast [employer] Your Pritikin Plate  WORKSHOPS   Healthy Mindset (Psychosocial):  Focused Goals, Sustainable Changes Clinical staff led group instruction and group discussion with PowerPoint presentation and patient guidebook. To enhance the learning environment the use of posters, models and videos may be added. Patients will be able to apply effective goal setting strategies to establish at least one personal goal, and then take consistent, meaningful action toward that goal. They will learn to identify common barriers to achieving personal goals and develop strategies to overcome them. Patients will also gain an understanding of how our mind-set can impact our ability to achieve goals and the importance of cultivating a positive  and growth-oriented mind-set. The purpose of this lesson is to provide patients with a deeper understanding of how to set and achieve personal goals, as well as the tools and strategies needed to overcome common obstacles which may arise along the way.  From Head to Heart: The Power of a Healthy Outlook  Clinical staff led group instruction and group discussion with PowerPoint presentation and patient guidebook. To enhance the learning environment the use of posters, models and videos may be added. Patients will be able to recognize and describe the impact of emotions and mood on physical health. They will discover the importance of self-care and explore self-care practices which may work for them. Patients will also learn how to utilize the 4 C's to cultivate a healthier outlook and better manage stress and challenges. The purpose of this lesson is to demonstrate to patients how a healthy outlook is an essential part of maintaining good health, especially as they continue their cardiac rehab journey.  Healthy Sleep for a Healthy Heart Clinical staff led group instruction and group discussion with PowerPoint presentation and patient guidebook. To enhance the learning environment the use of posters, models and videos may be added. At the conclusion of this workshop, patients will be able to demonstrate knowledge of the importance of sleep to overall health, well-being, and quality of life. They will understand the symptoms of, and treatments for, common sleep disorders. Patients will also be able to identify daytime and nighttime behaviors which impact sleep, and they will be able to apply these tools to help manage sleep-related challenges. The purpose of this lesson is to provide patients with a general overview of sleep and outline the importance of quality sleep. Patients will learn about a few of the most common sleep disorders. Patients will also be introduced to the concept of "sleep hygiene," and  discover ways to self-manage certain sleeping problems through simple daily behavior changes. Finally, the workshop will motivate patients by clarifying the links between quality sleep and their goals of heart-healthy living.   Recognizing and Reducing Stress Clinical staff led group instruction and group discussion with PowerPoint presentation and patient guidebook. To enhance the learning environment the use of posters, models and videos may be added. At the conclusion of this workshop, patients will be able to understand the types of stress reactions, differentiate between acute and chronic stress, and recognize the impact that chronic stress has on their health. They will also be able to apply different coping mechanisms, such as reframing negative self-talk. Patients will have the opportunity to practice a variety of stress management techniques, such as deep abdominal breathing,  progressive muscle relaxation, and/or guided imagery.  The purpose of this lesson is to educate patients on the role of stress in their lives and to provide healthy techniques for coping with it.  Learning Barriers/Preferences:  Learning Barriers/Preferences - 10/10/22 1142       Learning Barriers/Preferences   Learning Barriers Sight   wears glasses   Learning Preferences Computer/Internet;Audio;Group Instruction;Individual Instruction;Pictoral;Skilled Demonstration;Verbal Instruction;Video;Written Material             Education Topics:  Knowledge Questionnaire Score:  Knowledge Questionnaire Score - 10/10/22 1346       Knowledge Questionnaire Score   Pre Score 19/24             Core Components/Risk Factors/Patient Goals at Admission:  Personal Goals and Risk Factors at Admission - 10/10/22 1344       Core Components/Risk Factors/Patient Goals on Admission   Hypertension Yes    Intervention Provide education on lifestyle modifcations including regular physical activity/exercise, weight  management, moderate sodium restriction and increased consumption of fresh fruit, vegetables, and low fat dairy, alcohol moderation, and smoking cessation.;Monitor prescription use compliance.    Expected Outcomes Short Term: Continued assessment and intervention until BP is < 140/8mm HG in hypertensive participants. < 130/45mm HG in hypertensive participants with diabetes, heart failure or chronic kidney disease.;Long Term: Maintenance of blood pressure at goal levels.    Lipids Yes    Intervention Provide education and support for participant on nutrition & aerobic/resistive exercise along with prescribed medications to achieve LDL 70mg , HDL >40mg .    Expected Outcomes Short Term: Participant states understanding of desired cholesterol values and is compliant with medications prescribed. Participant is following exercise prescription and nutrition guidelines.;Long Term: Cholesterol controlled with medications as prescribed, with individualized exercise RX and with personalized nutrition plan. Value goals: LDL < 70mg , HDL > 40 mg.             Core Components/Risk Factors/Patient Goals Review:    Core Components/Risk Factors/Patient Goals at Discharge (Final Review):    ITP Comments:  ITP Comments     Row Name 10/10/22 1138           ITP Comments Armanda Magic, MD: Medical Director.  Introduction to the [facility] / Intensive Cardiac Rehab.  Initial orientation packet reviewed with the patient.                Comments: Participant attended orientation for the cardiac rehabilitation program on  10/10/2022  to perform initial intake and exercise walk test. Patient introduced to the Pritikin Program education and orientation packet was reviewed. Completed 6-minute walk test, measurements, initial ITP, and exercise prescription. Vital signs stable. Telemetry: bradycardiac - normal sinus rhythm, asymptomatic.   Service time was from 10:30 to 12:45.

## 2022-10-14 ENCOUNTER — Encounter (HOSPITAL_COMMUNITY)
Admission: RE | Admit: 2022-10-14 | Discharge: 2022-10-14 | Disposition: A | Discharge status: Home / Self Care | Payer: Medicaid Other | Source: Ambulatory Visit | Attending: Cardiology | Admitting: Cardiology

## 2022-10-14 DIAGNOSIS — I2129 ST elevation (STEMI) myocardial infarction involving other sites: Secondary | ICD-10-CM

## 2022-10-14 DIAGNOSIS — Z955 Presence of coronary angioplasty implant and graft: Secondary | ICD-10-CM

## 2022-10-14 NOTE — Progress Notes (Signed)
Daily Session Note  Patient Details  Name: Marisa Fuller MRN: [id number] Date of Birth: [birth year] Referring Provider:   Flowsheet Row CARDIAC REHAB PHASE II ORIENTATION from 10/10/2022 in [facility]  Referring Provider Jodelle Red, MD       Encounter Date: 10/14/2022  Check In:  Session Check In - 10/14/22 1330       Check-In   Supervising physician immediately available to respond to emergencies [facility] MD immediately available    Physician(s) Neila Gear, NP    Location [facility]    Staff Present Lorin Picket, MS, ACSM-CEP, CCRP, Exercise Physiologist;Sarah Cleophas Dunker, RN, MSN;Edythe Riches, RN, Lavonia Dana, RN, Zachery Conch, MS, ACSM-CEP, Exercise Physiologist;Casey Katrinka Blazing, RT    Virtual Visit No    Medication changes reported     No    Fall or balance concerns reported    No    Tobacco Cessation No Change    Warm-up and Cool-down Not performed (comment)   CRP2 orientation   Resistance Training Performed No    VAD Patient? No    PAD/SET Patient? No      Pain Assessment   Currently in Pain? No/denies    Pain Score 0-No pain    Multiple Pain Sites No             Capillary Blood Glucose: No results found for this or any previous visit (from the past 24 hour(s)).   Exercise Prescription Changes - 10/14/22 1400       Response to Exercise   Blood Pressure (Admit) 114/64    Blood Pressure (Exercise) 132/80    Blood Pressure (Exit) 98/72    Heart Rate (Admit) 60 bpm    Heart Rate (Exercise) 82 bpm    Heart Rate (Exit) 56 bpm    Rating of Perceived Exertion (Exercise) 12    Symptoms None    Comments Pt's first day in the CRP2 program    Duration Continue with 30 min of aerobic exercise without signs/symptoms of physical distress.    Intensity THRR unchanged      Progression   Progression Continue to follow PAD protocol    Average METs 2.2      Resistance  Training   Training Prescription Yes    Weight 3 lbs    Reps 10-15    Time 10 Minutes      Interval Training   Interval Training No      Bike   Level 1    Minutes 15    METs 2.3      NuStep   Level 1    SPM 86    Minutes 15    METs 2.1             Social History   Tobacco Use  Smoking Status Former  Smokeless Tobacco Never    Goals Met:  Exercise tolerated well No report of concerns or symptoms today Strength training completed today  Goals Unmet:  Not Applicable  Comments: Pt started cardiac rehab today.  Pt tolerated light exercise without difficulty. VSS, telemetry-Sinus Rhythm, asymptomatic.  Medication list reconciled. Pt denies barriers to medicaiton compliance.  PSYCHOSOCIAL ASSESSMENT:  PHQ-3. Pt exhibits positive coping skills, hopeful outlook with supportive family. No psychosocial needs identified at this time, no psychosocial interventions necessary.    Pt enjoys spending time with her grandchild .   Pt oriented to exercise equipment and routine.    Understanding verbalized.  Harrell Gave RN BSN    Dr. Fransico Him is Medical Director for Cardiac Rehab at [facility].

## 2022-10-16 ENCOUNTER — Encounter (HOSPITAL_COMMUNITY)
Admission: RE | Admit: 2022-10-16 | Discharge: 2022-10-16 | Disposition: A | Discharge status: Home / Self Care | Payer: Medicaid Other | Source: Ambulatory Visit | Attending: Cardiology | Admitting: Cardiology

## 2022-10-16 DIAGNOSIS — Z955 Presence of coronary angioplasty implant and graft: Secondary | ICD-10-CM

## 2022-10-16 DIAGNOSIS — I2129 ST elevation (STEMI) myocardial infarction involving other sites: Secondary | ICD-10-CM

## 2022-10-18 ENCOUNTER — Encounter (HOSPITAL_COMMUNITY): Payer: Medicaid Other

## 2022-10-21 ENCOUNTER — Encounter (HOSPITAL_COMMUNITY)
Admission: RE | Admit: 2022-10-21 | Discharge: 2022-10-21 | Disposition: A | Discharge status: Home / Self Care | Payer: Medicaid Other | Source: Ambulatory Visit | Attending: Cardiology | Admitting: Cardiology

## 2022-10-21 DIAGNOSIS — Z955 Presence of coronary angioplasty implant and graft: Secondary | ICD-10-CM

## 2022-10-21 DIAGNOSIS — I2129 ST elevation (STEMI) myocardial infarction involving other sites: Secondary | ICD-10-CM

## 2022-10-22 NOTE — Progress Notes (Signed)
Cardiac Individual Treatment Plan  Patient Details  Name: Marisa Fuller MRN: [id number] Date of Birth: [birth year] Referring Provider:   Flowsheet Row CARDIAC REHAB PHASE II ORIENTATION from 10/10/2022 in [facility]  Referring Provider Jodelle Red, MD       Initial Encounter Date:  Flowsheet Row CARDIAC REHAB PHASE II ORIENTATION from 10/10/2022 in [facility] for Heart, Vascular, & Lung Health  Date 10/10/22       Visit Diagnosis: 07/18/22 ST elevation myocardial infarction (STEMI)  07/18/22 OM Status post coronary artery stent placement  Patient's Home Medications on Admission:  Current Outpatient Medications:    aspirin EC 81 MG tablet, Take 1 tablet (81 mg total) by mouth daily. Swallow whole., Disp: , Rfl:    atorvastatin (LIPITOR) 40 MG tablet, Take 1 tablet (40 mg total) by mouth daily., Disp: 90 tablet, Rfl: 3   cetirizine (ZYRTEC) 10 MG tablet, Take 10 mg by mouth at bedtime., Disp: , Rfl:    fluticasone (FLONASE) 50 MCG/ACT nasal spray, Place 1 spray into both nostrils daily., Disp: 18.2 mL, Rfl: 2   losartan (COZAAR) 50 MG tablet, Take 1 tablet (50 mg total) by mouth daily., Disp: 30 tablet, Rfl: 5   metoprolol succinate (TOPROL XL) 25 MG 24 hr tablet, Take 1 tablet (25 mg total) by mouth daily., Disp: 30 tablet, Rfl: 5   Multiple Vitamins-Minerals (MULTIVITAMIN WOMEN PO), Take 1 tablet by mouth daily. (Patient not taking: Reported on 10/10/2022), Disp: , Rfl:    nitroGLYCERIN (NITROSTAT) 0.4 MG SL tablet, Place 1 tablet (0.4 mg total) under the tongue every 5 (five) minutes as needed for chest pain., Disp: 25 tablet, Rfl: 1   Study - EVOLVE-MI - evolocumab (REPATHA) 140 mg/mL SQ injection (PI-Stuckey), Inject 1 mL (140 mg total) into the skin every 14 (fourteen) days. Inject 1 mL (140 mg) subcutaneously into abdomen, thigh, or upper arm every 14 days. Rotate injection sites and do not  inject into areas where skin is tender, bruised, or red. Please contact [facility] for any questions or concerns regarding this medication., Disp: 12 mL, Rfl: 0   ticagrelor (BRILINTA) 90 MG TABS tablet, Take 1 tablet (90 mg total) by mouth 2 (two) times daily., Disp: 60 tablet, Rfl: 11  Current Facility-Administered Medications:    Study - EVOLVE-MI - evolocumab (REPATHA) 140 mg/mL SQ injection (PI-Stuckey), 140 mg, Subcutaneous, Q14 Days, Herby Abraham, MD, 140 mg at 07/24/22 1307  Past Medical History: Past Medical History:  Diagnosis Date   Allergy    Anemia    sickle cell trait   CAD (coronary artery disease) 07/18/2022   DES to first obtuse marginal   Clotting disorder (HCC)    DVT (deep venous thrombosis) (HCC)    Remote DVT per notes   GERD (gastroesophageal reflux disease)    Hyperlipidemia    Hypertension    Sickle cell anemia (HCC)    trait    Tobacco Use: Social History   Tobacco Use  Smoking Status Former  Smokeless Tobacco Never    Labs: Review Flowsheet  More data exists      Latest Ref Rng & Units 08/31/2019 07/18/2022 07/19/2022 07/20/2022 09/24/2022  Labs for ITP Cardiac and Pulmonary Rehab  Cholestrol 100 - 199 mg/dL 409  - 811  914  85   LDL (calc) 0 - 99 mg/dL 782  - 956  213  16   HDL-C >39 mg/dL 53  -  52  45  53   Trlycerides 0 - 149 mg/dL 161  - 096  045  77   Hemoglobin A1c 4.8 - 5.6 % - 6.3  - - -  TCO2 22 - 32 mmol/L - 20  - - -    Capillary Blood Glucose: No results found for: "GLUCAP"   Exercise Target Goals: Exercise Program Goal: Individual exercise prescription set using results from initial 6 min walk test and THRR while considering  patient's activity barriers and safety.   Exercise Prescription Goal: Initial exercise prescription builds to 30-45 minutes a day of aerobic activity, 2-3 days per week.  Home exercise guidelines will be given to patient during program as part of exercise prescription that the participant  will acknowledge.  Activity Barriers & Risk Stratification:  Activity Barriers & Cardiac Risk Stratification - 10/10/22 1405       Activity Barriers & Cardiac Risk Stratification   Activity Barriers Deconditioning;Shortness of Breath    Cardiac Risk Stratification High             6 Minute Walk:  6 Minute Walk     Row Name 10/10/22 1212         6 Minute Walk   Phase Initial     Distance 1184 feet     Walk Time 6 minutes     # of Rest Breaks 0     MPH 2.24     METS 3.21     RPE 8     Perceived Dyspnea  1     VO2 Peak 11.22     Symptoms Yes (comment)     Comments SOB, RPD = 1, Fatigue     Resting HR 56 bpm     Resting BP 126/70     Resting Oxygen Saturation  100 %     Exercise Oxygen Saturation  during 6 min walk 100 %     Max Ex. HR 84 bpm     Max Ex. BP 132/78     2 Minute Post BP 120/70              Oxygen Initial Assessment:   Oxygen Re-Evaluation:   Oxygen Discharge (Final Oxygen Re-Evaluation):   Initial Exercise Prescription:  Initial Exercise Prescription - 10/10/22 1400       Date of Initial Exercise RX and Referring Provider   Date 10/10/22    Referring Provider bridgette Cristal Deer, MD    Expected Discharge Date 12/20/22      Bike   Level 1    Watts 25    Minutes 15    METs 3.2      NuStep   Level 1    SPM 75    Minutes 15    METs 3.2      Prescription Details   Frequency (times per week) 3    Duration Progress to 30 minutes of continuous aerobic without signs/symptoms of physical distress      Intensity   THRR 40-80% of Max Heartrate 62-123    Ratings of Perceived Exertion 11-13    Perceived Dyspnea 0-4      Progression   Progression Continue progressive overload as per policy without signs/symptoms or physical distress.      Resistance Training   Training Prescription Yes    Weight 3 lbs    Reps 10-15             Perform Capillary Blood Glucose checks as needed.  Exercise Prescription Changes:  Exercise Prescription Changes     Row Name 10/14/22 1400             Response to Exercise   Blood Pressure (Admit) 114/64       Blood Pressure (Exercise) 132/80       Blood Pressure (Exit) 98/72       Heart Rate (Admit) 60 bpm       Heart Rate (Exercise) 82 bpm       Heart Rate (Exit) 56 bpm       Rating of Perceived Exertion (Exercise) 12       Symptoms None       Comments Pt's first day in the CRP2 program       Duration Continue with 30 min of aerobic exercise without signs/symptoms of physical distress.       Intensity THRR unchanged         Progression   Progression Continue to follow PAD protocol       Average METs 2.2         Resistance Training   Training Prescription Yes       Weight 3 lbs       Reps 10-15       Time 10 Minutes         Interval Training   Interval Training No         Bike   Level 1       Minutes 15       METs 2.3         NuStep   Level 1       SPM 86       Minutes 15       METs 2.1                Exercise Comments:   Exercise Comments     Row Name 10/14/22 1410           Exercise Comments Pt's first day in the CRP2 program. Pt exercise today with no complaints.                Exercise Goals and Review:   Exercise Goals     Row Name 10/10/22 1404             Exercise Goals   Increase Physical Activity Yes       Intervention Provide advice, education, support and counseling about physical activity/exercise needs.;Develop an individualized exercise prescription for aerobic and resistive training based on initial evaluation findings, risk stratification, comorbidities and participant's personal goals.       Expected Outcomes Short Term: Attend rehab on a regular basis to increase amount of physical activity.;Long Term: Add in home exercise to make exercise part of routine and to increase amount of physical activity.;Long Term: Exercising regularly at least 3-5 days a week.       Increase Strength and Stamina Yes        Intervention Provide advice, education, support and counseling about physical activity/exercise needs.;Develop an individualized exercise prescription for aerobic and resistive training based on initial evaluation findings, risk stratification, comorbidities and participant's personal goals.       Expected Outcomes Short Term: Increase workloads from initial exercise prescription for resistance, speed, and METs.;Short Term: Perform resistance training exercises routinely during rehab and add in resistance training at home;Long Term: Improve cardiorespiratory fitness, muscular endurance and strength as measured by increased METs and functional capacity ( )       Able to understand and  use rate of perceived exertion (RPE) scale Yes       Intervention Provide education and explanation on how to use RPE scale       Expected Outcomes Short Term: Able to use RPE daily in rehab to express subjective intensity level;Long Term:  Able to use RPE to guide intensity level when exercising independently       Knowledge and understanding of Target Heart Rate Range (THRR) Yes       Intervention Provide education and explanation of THRR including how the numbers were predicted and where they are located for reference       Expected Outcomes Short Term: Able to state/look up THRR;Long Term: Able to use THRR to govern intensity when exercising independently;Short Term: Able to use daily as guideline for intensity in rehab       Understanding of Exercise Prescription Yes       Intervention Provide education, explanation, and written materials on patient's individual exercise prescription       Expected Outcomes Short Term: Able to explain program exercise prescription;Long Term: Able to explain home exercise prescription to exercise independently                Exercise Goals Re-Evaluation :  Exercise Goals Re-Evaluation     Row Name 10/14/22 1409             Exercise Goal Re-Evaluation   Exercise  Goals Review Increase Physical Activity;Increase Strength and Stamina;Able to understand and use rate of perceived exertion (RPE) scale;Knowledge and understanding of Target Heart Rate Range (THRR);Understanding of Exercise Prescription       Comments Pt's first day in the cRP2 program. Pt understands the exercise Rx, RPE scale and THRR,       Expected Outcomes Will continue to montior patient and progress exercise workloads as tolerated.                Discharge Exercise Prescription (Final Exercise Prescription Changes):  Exercise Prescription Changes - 10/14/22 1400       Response to Exercise   Blood Pressure (Admit) 114/64    Blood Pressure (Exercise) 132/80    Blood Pressure (Exit) 98/72    Heart Rate (Admit) 60 bpm    Heart Rate (Exercise) 82 bpm    Heart Rate (Exit) 56 bpm    Rating of Perceived Exertion (Exercise) 12    Symptoms None    Comments Pt's first day in the CRP2 program    Duration Continue with 30 min of aerobic exercise without signs/symptoms of physical distress.    Intensity THRR unchanged      Progression   Progression Continue to follow PAD protocol    Average METs 2.2      Resistance Training   Training Prescription Yes    Weight 3 lbs    Reps 10-15    Time 10 Minutes      Interval Training   Interval Training No      Bike   Level 1    Minutes 15    METs 2.3      NuStep   Level 1    SPM 86    Minutes 15    METs 2.1             Nutrition:  Target Goals: Understanding of nutrition guidelines, daily intake of sodium 1500mg , cholesterol 200mg , calories 30% from fat and 7% or less from saturated fats, daily to have 5 or more servings of fruits and vegetables.  Biometrics:  Pre Biometrics - 10/10/22 1124       Pre Biometrics   Waist Circumference 35 inches    Hip Circumference 38 inches    Waist to Hip Ratio 0.92 %    Triceps Skinfold 16 mm    % Body Fat 33.2 %    Grip Strength 41 kg    Flexibility 13 in    Single Leg Stand  30 seconds              Nutrition Therapy Plan and Nutrition Goals:  Nutrition Therapy & Goals - 10/14/22 1508       Nutrition Therapy   Diet Heart healthy diet    Drug/Food Interactions Statins/Certain Fruits      Personal Nutrition Goals   Nutrition Goal Patient to identify strategies for reducing cardiovascular risk by attending the Pritikin education and nutrition series weekly.    Personal Goal #2 Patient to improve diet quality by using the plate method as a guide for meal planning to include lean protein/plant protein, fruits, vegetables, whole grains, nonfat dairy as part of a well-balanced diet.    Personal Goal #3 Patient to reduce sodium intake to 1500mg  per day    Comments Patient will benefit from participation in intensive cardiac rehab for nutrition, exercise, and lifestyle modification.      Intervention Plan   Intervention Prescribe, educate and counsel regarding individualized specific dietary modifications aiming towards targeted core components such as weight, hypertension, lipid management, diabetes, heart failure and other comorbidities.;Nutrition handout(s) given to patient.    Expected Outcomes Short Term Goal: Understand basic principles of dietary content, such as calories, fat, sodium, cholesterol and nutrients.;Long Term Goal: Adherence to prescribed nutrition plan.             Nutrition Assessments:  Nutrition Assessments - 10/17/22 1629       Rate Your Plate Scores   Pre Score 68            MEDIFICTS Score Key: ?70 Need to make dietary changes  40-70 Heart Healthy Diet ? 40 Therapeutic Level Cholesterol Diet   Flowsheet Row CARDIAC REHAB PHASE II EXERCISE from 10/16/2022 in [facility], & Lung [facility]  Picture Your Plate Total Score on Admission 68      Picture Your Plate Scores: <16 Unhealthy dietary pattern with much room for improvement. 41-50 Dietary pattern unlikely to meet  recommendations for good health and room for improvement. 51-60 More healthful dietary pattern, with some room for improvement.  >60 Healthy dietary pattern, although there may be some specific behaviors that could be improved.    Nutrition Goals Re-Evaluation:  Nutrition Goals Re-Evaluation     Row Name 10/14/22 1508             Goals   Current Weight 149 lb 4 oz (67.7 kg)       Comment lipids drastically improved WNL, lipoprotein A169.2, AST 45, ALT 96       Expected Outcome Patient will benefit from participation in intensive cardiac rehab for nutrition, exercise, and lifestyle modification.                Nutrition Goals Re-Evaluation:  Nutrition Goals Re-Evaluation     Row Name 10/14/22 1508             Goals   Current Weight 149 lb 4 oz (67.7 kg)       Comment lipids drastically improved WNL, lipoprotein A169.2, AST 45, ALT  96       Expected Outcome Patient will benefit from participation in intensive cardiac rehab for nutrition, exercise, and lifestyle modification.                Nutrition Goals Discharge (Final Nutrition Goals Re-Evaluation):  Nutrition Goals Re-Evaluation - 10/14/22 1508       Goals   Current Weight 149 lb 4 oz (67.7 kg)    Comment lipids drastically improved WNL, lipoprotein A169.2, AST 45, ALT 96    Expected Outcome Patient will benefit from participation in intensive cardiac rehab for nutrition, exercise, and lifestyle modification.             Psychosocial: Target Goals: Acknowledge presence or absence of significant depression and/or stress, maximize coping skills, provide positive support system. Participant is able to verbalize types and ability to use techniques and skills needed for reducing stress and depression.  Initial Review & Psychosocial Screening:  Initial Psych Review & Screening - 10/10/22 1142       Initial Review   Current issues with None Identified      Family Dynamics   Good Support System? Yes    Pt has son and daughter for support     Barriers   Psychosocial barriers to participate in program There are no identifiable barriers or psychosocial needs.      Screening Interventions   Interventions Encouraged to exercise             Quality of Life Scores:  Quality of Life - 10/10/22 1406       Quality of Life   Select Quality of Life      Quality of Life Scores   Health/Function Pre 27.2 %    Socioeconomic Pre 30 %    Psych/Spiritual Pre 30 %    Family Pre 30 %    GLOBAL Pre 28.8 %            Scores of 19 and below usually indicate a poorer quality of life in these areas.  A difference of  2-3 points is a clinically meaningful difference.  A difference of 2-3 points in the total score of the Quality of Life Index has been associated with significant improvement in overall quality of life, self-image, physical symptoms, and general health in studies assessing change in quality of life.  PHQ-9: Review Flowsheet       10/10/2022 07/30/2022  Depression screen PHQ 2/9  Decreased Interest 0 0  Down, Depressed, Hopeless 0 0  PHQ - 2 Score 0 0  Altered sleeping 0 0  Tired, decreased energy 3 3  Change in appetite 0 0  Feeling bad or failure about yourself  0 0  Trouble concentrating 0 0  Moving slowly or fidgety/restless 0 0  Suicidal thoughts 0 0  PHQ-9 Score 3 3  Difficult doing work/chores Somewhat difficult Not difficult at all   Interpretation of Total Score  Total Score Depression Severity:  1-4 = Minimal depression, 5-9 = Mild depression, 10-14 = Moderate depression, 15-19 = Moderately severe depression, 20-27 = Severe depression   Psychosocial Evaluation and Intervention:   Psychosocial Re-Evaluation:  Psychosocial Re-Evaluation     Row Name 10/14/22 1434 10/22/22 1740           Psychosocial Re-Evaluation   Current issues with None Identified None Identified      Interventions Encouraged to attend Cardiac Rehabilitation for the exercise  Encouraged to attend Cardiac Rehabilitation for the exercise  Continue Psychosocial Services  No Follow up required No Follow up required               Psychosocial Discharge (Final Psychosocial Re-Evaluation):  Psychosocial Re-Evaluation - 10/22/22 1740       Psychosocial Re-Evaluation   Current issues with None Identified    Interventions Encouraged to attend Cardiac Rehabilitation for the exercise    Continue Psychosocial Services  No Follow up required             Vocational Rehabilitation: Provide vocational rehab assistance to qualifying candidates.   Vocational Rehab Evaluation & Intervention:  Vocational Rehab - 10/10/22 1142       Initial Vocational Rehab Evaluation & Intervention   Assessment shows need for Vocational Rehabilitation No   Pt denies any vocational needs at this time            Education: Education Goals: Education classes will be provided on a weekly basis, covering required topics. Participant will state understanding/return demonstration of topics presented.    Education     Row Name 10/14/22 1500     Education   Cardiac Education Topics Pritikin   Licensed conveyancer Nutrition   Nutrition Facts on Fat   Instruction Review Code 1- Verbalizes Understanding   Class Start Time 1400   Class Stop Time 1439   Class Time Calculation (min) 39 min    Row Name 10/16/22 1600     Education   Cardiac Education Topics Pritikin   Customer service manager   Weekly Topic Fast Evening Meals   Instruction Review Code 1- Verbalizes Understanding   Class Start Time 1400   Class Stop Time 1440   Class Time Calculation (min) 40 min    Row Name 10/21/22 1700     Education   Cardiac Education Topics Pritikin   Geographical information systems officer Psychosocial   Psychosocial Workshop Healthy Sleep for a Healthy  Heart   Instruction Review Code 1- Verbalizes Understanding   Class Start Time 1145   Class Stop Time 1233   Class Time Calculation (min) 48 min    Row Name 10/23/22 1400     Education   Cardiac Education Topics Pritikin   Customer service manager   Weekly Topic International Cuisine- Spotlight on the [employer] Zones   Instruction Review Code 1- Verbalizes Understanding   Class Start Time 1140   Class Stop Time 1212   Class Time Calculation (min) 32 min            Core Videos: Exercise    Move It!  Clinical staff conducted group or individual video education with verbal and written material and guidebook.  Patient learns the recommended Pritikin exercise program. Exercise with the goal of living a long, healthy life. Some of the health benefits of exercise include controlled diabetes, healthier blood pressure levels, improved cholesterol levels, improved heart and lung capacity, improved sleep, and better body composition. Everyone should speak with their doctor before starting or changing an exercise routine.  Biomechanical Limitations Clinical staff conducted group or individual video education with verbal and written material and guidebook.  Patient learns how biomechanical limitations can impact exercise and how we can mitigate and possibly overcome limitations to have an impactful and  balanced exercise routine.  Body Composition Clinical staff conducted group or individual video education with verbal and written material and guidebook.  Patient learns that body composition (ratio of muscle mass to fat mass) is a key component to assessing overall fitness, rather than body weight alone. Increased fat mass, especially visceral belly fat, can put [place] at increased risk for metabolic syndrome, type 2 diabetes, heart disease, and even death. It is recommended to combine diet and exercise (cardiovascular and resistance training) to improve your body  composition. Seek guidance from your physician and exercise physiologist before implementing an exercise routine.  Exercise Action Plan Clinical staff conducted group or individual video education with verbal and written material and guidebook.  Patient learns the recommended strategies to achieve and enjoy long-term exercise adherence, including variety, self-motivation, self-efficacy, and positive decision making. Benefits of exercise include fitness, good health, weight management, more energy, better sleep, less stress, and overall well-being.  Medical   Heart Disease Risk Reduction Clinical staff conducted group or individual video education with verbal and written material and guidebook.  Patient learns our heart is our most vital organ as it circulates oxygen, nutrients, white blood cells, and hormones throughout the entire body, and carries waste away. Data supports a plant-based eating plan like the Pritikin Program for its effectiveness in slowing progression of and reversing heart disease. The video provides a number of recommendations to address heart disease.   Metabolic Syndrome and Belly Fat  Clinical staff conducted group or individual video education with verbal and written material and guidebook.  Patient learns what metabolic syndrome is, how it leads to heart disease, and how one can reverse it and keep it from coming back. You have metabolic syndrome if you have 3 of the following 5 criteria: abdominal obesity, high blood pressure, high triglycerides, low HDL cholesterol, and high blood sugar.  Hypertension and Heart Disease Clinical staff conducted group or individual video education with verbal and written material and guidebook.  Patient learns that high blood pressure, or hypertension, is very common in the [place]. Hypertension is largely due to excessive salt intake, but other important risk factors include being overweight, physical inactivity, drinking too much  alcohol, smoking, and not eating enough potassium from fruits and vegetables. High blood pressure is a leading risk factor for heart attack, stroke, congestive heart failure, dementia, kidney failure, and premature death. Long-term effects of excessive salt intake include stiffening of the arteries and thickening of heart muscle and organ damage. Recommendations include ways to reduce hypertension and the risk of heart disease.  Diseases of Our Time - Focusing on Diabetes Clinical staff conducted group or individual video education with verbal and written material and guidebook.  Patient learns why the best way to stop diseases of our time is prevention, through food and other lifestyle changes. Medicine (such as prescription pills and surgeries) is often only a Band-Aid on the problem, not a long-term solution. Most common diseases of our time include obesity, type 2 diabetes, hypertension, heart disease, and cancer. The Pritikin Program is recommended and has been proven to help reduce, reverse, and/or prevent the damaging effects of metabolic syndrome.  Nutrition   Overview of the Pritikin Eating Plan  Clinical staff conducted group or individual video education with verbal and written material and guidebook.  Patient learns about the Pritikin Eating Plan for disease risk reduction. The Pritikin Eating Plan emphasizes a wide variety of unrefined, minimally-processed carbohydrates, like fruits, vegetables, whole grains, and legumes. Go, Caution, and  Stop food choices are explained. Plant-based and lean animal proteins are emphasized. Rationale provided for low sodium intake for blood pressure control, low added sugars for blood sugar stabilization, and low added fats and oils for coronary artery disease risk reduction and weight management.  Calorie Density  Clinical staff conducted group or individual video education with verbal and written material and guidebook.  Patient learns about calorie  density and how it impacts the Pritikin Eating Plan. Knowing the characteristics of the food you choose will help you decide whether those foods will lead to weight gain or weight loss, and whether you want to consume more or less of them. Weight loss is usually a side effect of the Pritikin Eating Plan because of its focus on low calorie-dense foods.  Label Reading  Clinical staff conducted group or individual video education with verbal and written material and guidebook.  Patient learns about the Pritikin recommended label reading guidelines and corresponding recommendations regarding calorie density, added sugars, sodium content, and whole grains.  Dining Out - Part 1  Clinical staff conducted group or individual video education with verbal and written material and guidebook.  Patient learns that restaurant meals can be sabotaging because they can be so high in calories, fat, sodium, and/or sugar. Patient learns recommended strategies on how to positively address this and avoid unhealthy pitfalls.  Facts on Fats  Clinical staff conducted group or individual video education with verbal and written material and guidebook.  Patient learns that lifestyle modifications can be just as effective, if not more so, as many medications for lowering your risk of heart disease. A Pritikin lifestyle can help to reduce your risk of inflammation and atherosclerosis (cholesterol build-up, or plaque, in the artery walls). Lifestyle interventions such as dietary choices and physical activity address the cause of atherosclerosis. A review of the types of fats and their impact on blood cholesterol levels, along with dietary recommendations to reduce fat intake is also included.  Nutrition Action Plan  Clinical staff conducted group or individual video education with verbal and written material and guidebook.  Patient learns how to incorporate Pritikin recommendations into their lifestyle. Recommendations include  planning and keeping personal health goals in mind as an important part of their success.  Healthy Mind-Set    Healthy Minds, Bodies, Hearts  Clinical staff conducted group or individual video education with verbal and written material and guidebook.  Patient learns how to identify when they are stressed. Video will discuss the impact of that stress, as well as the many benefits of stress management. Patient will also be introduced to stress management techniques. The way we think, act, and feel has an impact on our hearts.  How Our Thoughts Can Heal Our Hearts  Clinical staff conducted group or individual video education with verbal and written material and guidebook.  Patient learns that negative thoughts can cause depression and anxiety. This can result in negative lifestyle behavior and serious health problems. Cognitive behavioral therapy is an effective method to help control our thoughts in order to change and improve our emotional outlook.  Additional Videos:  Exercise    Improving Performance  Clinical staff conducted group or individual video education with verbal and written material and guidebook.  Patient learns to use a non-linear approach by alternating intensity levels and lengths of time spent exercising to help burn more calories and lose more body fat. Cardiovascular exercise helps improve heart health, metabolism, hormonal balance, blood sugar control, and recovery from fatigue. Resistance training improves  strength, endurance, balance, coordination, reaction time, metabolism, and muscle mass. Flexibility exercise improves circulation, posture, and balance. Seek guidance from your physician and exercise physiologist before implementing an exercise routine and learn your capabilities and proper form for all exercise.  Introduction to Yoga  Clinical staff conducted group or individual video education with verbal and written material and guidebook.  Patient learns about yoga, a  discipline of the coming together of mind, breath, and body. The benefits of yoga include improved flexibility, improved range of motion, better posture and core strength, increased lung function, weight loss, and positive self-image. Yoga's heart health benefits include lowered blood pressure, healthier heart rate, decreased cholesterol and triglyceride levels, improved immune function, and reduced stress. Seek guidance from your physician and exercise physiologist before implementing an exercise routine and learn your capabilities and proper form for all exercise.  Medical   Aging: Enhancing Your Quality of Life  Clinical staff conducted group or individual video education with verbal and written material and guidebook.  Patient learns key strategies and recommendations to stay in good physical health and enhance quality of life, such as prevention strategies, having an advocate, securing a Health Care Proxy and Power of Attorney, and keeping a list of medications and system for tracking them. It also discusses how to avoid risk for bone loss.  Biology of Weight Control  Clinical staff conducted group or individual video education with verbal and written material and guidebook.  Patient learns that weight gain occurs because we consume more calories than we burn (eating more, moving less). Even if your body weight is normal, you may have higher ratios of fat compared to muscle mass. Too much body fat puts you at increased risk for cardiovascular disease, heart attack, stroke, type 2 diabetes, and obesity-related cancers. In addition to exercise, following the Pritikin Eating Plan can help reduce your risk.  Decoding Lab Results  Clinical staff conducted group or individual video education with verbal and written material and guidebook.  Patient learns that lab test reflects one measurement whose values change over time and are influenced by many factors, including medication, stress, sleep, exercise,  food, hydration, pre-existing medical conditions, and more. It is recommended to use the knowledge from this video to become more involved with your lab results and evaluate your numbers to speak with your doctor.   Diseases of Our Time - Overview  Clinical staff conducted group or individual video education with verbal and written material and guidebook.  Patient learns that according to the CDC, 50% to 70% of chronic diseases (such as obesity, type 2 diabetes, elevated lipids, hypertension, and heart disease) are avoidable through lifestyle improvements including healthier food choices, listening to satiety cues, and increased physical activity.  Sleep Disorders Clinical staff conducted group or individual video education with verbal and written material and guidebook.  Patient learns how good quality and duration of sleep are important to overall health and well-being. Patient also learns about sleep disorders and how they impact health along with recommendations to address them, including discussing with a physician.  Nutrition  Dining Out - Part 2 Clinical staff conducted group or individual video education with verbal and written material and guidebook.  Patient learns how to plan ahead and communicate in order to maximize their dining experience in a healthy and nutritious manner. Included are recommended food choices based on the type of restaurant the patient is visiting.   Fueling a Banker conducted group or individual video education with  verbal and written material and guidebook.  There is a strong connection between our food choices and our health. Diseases like obesity and type 2 diabetes are very prevalent and are in large-part due to lifestyle choices. The Pritikin Eating Plan provides plenty of food and hunger-curbing satisfaction. It is easy to follow, affordable, and helps reduce health risks.  Menu Workshop  Clinical staff conducted group or individual  video education with verbal and written material and guidebook.  Patient learns that restaurant meals can sabotage health goals because they are often packed with calories, fat, sodium, and sugar. Recommendations include strategies to plan ahead and to communicate with the manager, chef, or server to help order a healthier meal.  Planning Your Eating Strategy  Clinical staff conducted group or individual video education with verbal and written material and guidebook.  Patient learns about the Pritikin Eating Plan and its benefit of reducing the risk of disease. The Pritikin Eating Plan does not focus on calories. Instead, it emphasizes high-quality, nutrient-rich foods. By knowing the characteristics of the foods, we choose, we can determine their calorie density and make informed decisions.  Targeting Your Nutrition Priorities  Clinical staff conducted group or individual video education with verbal and written material and guidebook.  Patient learns that lifestyle habits have a tremendous impact on disease risk and progression. This video provides eating and physical activity recommendations based on your personal health goals, such as reducing LDL cholesterol, losing weight, preventing or controlling type 2 diabetes, and reducing high blood pressure.  Vitamins and Minerals  Clinical staff conducted group or individual video education with verbal and written material and guidebook.  Patient learns different ways to obtain key vitamins and minerals, including through a recommended healthy diet. It is important to discuss all supplements you take with your doctor.   Healthy Mind-Set    Smoking Cessation  Clinical staff conducted group or individual video education with verbal and written material and guidebook.  Patient learns that cigarette smoking and tobacco addiction pose a serious health risk which affects millions of people. Stopping smoking will significantly reduce the risk of heart  disease, lung disease, and many forms of cancer. Recommended strategies for quitting are covered, including working with your doctor to develop a successful plan.  Culinary   Becoming a Set designer conducted group or individual video education with verbal and written material and guidebook.  Patient learns that cooking at home can be healthy, cost-effective, quick, and puts them in control. Keys to cooking healthy recipes will include looking at your recipe, assessing your equipment needs, planning ahead, making it simple, choosing cost-effective seasonal ingredients, and limiting the use of added fats, salts, and sugars.  Cooking - Breakfast and Snacks  Clinical staff conducted group or individual video education with verbal and written material and guidebook.  Patient learns how important breakfast is to satiety and nutrition through the entire day. Recommendations include key foods to eat during breakfast to help stabilize blood sugar levels and to prevent overeating at meals later in the day. Planning ahead is also a key component.  Cooking - Educational psychologist conducted group or individual video education with verbal and written material and guidebook.  Patient learns eating strategies to improve overall health, including an approach to cook more at home. Recommendations include thinking of animal protein as a side on your plate rather than center stage and focusing instead on lower calorie dense options like vegetables, fruits, whole grains, and  plant-based proteins, such as beans. Making sauces in large quantities to freeze for later and leaving the skin on your vegetables are also recommended to maximize your experience.  Cooking - Healthy Salads and Dressing Clinical staff conducted group or individual video education with verbal and written material and guidebook.  Patient learns that vegetables, fruits, whole grains, and legumes are the foundations of the  Pritikin Eating Plan. Recommendations include how to incorporate each of these in flavorful and healthy salads, and how to create homemade salad dressings. Proper handling of ingredients is also covered. Cooking - Soups and [employer] - Soups and Desserts Clinical staff conducted group or individual video education with verbal and written material and guidebook.  Patient learns that Pritikin soups and desserts make for easy, nutritious, and delicious snacks and meal components that are low in sodium, fat, sugar, and calorie density, while high in vitamins, minerals, and filling fiber. Recommendations include simple and healthy ideas for soups and desserts.   Overview     The Pritikin Solution Program Overview Clinical staff conducted group or individual video education with verbal and written material and guidebook.  Patient learns that the results of the Pritikin Program have been documented in more than 100 articles published in peer-reviewed journals, and the benefits include reducing risk factors for (and, in some cases, even reversing) high cholesterol, high blood pressure, type 2 diabetes, obesity, and more! An overview of the three key pillars of the Pritikin Program will be covered: eating well, doing regular exercise, and having a healthy mind-set.  WORKSHOPS  Exercise: Exercise Basics: Building Your Action Plan Clinical staff led group instruction and group discussion with PowerPoint presentation and patient guidebook. To enhance the learning environment the use of posters, models and videos may be added. At the conclusion of this workshop, patients will comprehend the difference between physical activity and exercise, as well as the benefits of incorporating both, into their routine. Patients will understand the FITT (Frequency, Intensity, Time, and Type) principle and how to use it to build an exercise action plan. In addition, safety concerns and other considerations for  exercise and cardiac rehab will be addressed by the presenter. The purpose of this lesson is to promote a comprehensive and effective weekly exercise routine in order to improve patients' overall level of fitness.   Managing Heart Disease: Your Path to a Healthier Heart Clinical staff led group instruction and group discussion with PowerPoint presentation and patient guidebook. To enhance the learning environment the use of posters, models and videos may be added.At the conclusion of this workshop, patients will understand the anatomy and physiology of the heart. Additionally, they will understand how Pritikin's three pillars impact the risk factors, the progression, and the management of heart disease.  The purpose of this lesson is to provide a high-level overview of the heart, heart disease, and how the Pritikin lifestyle positively impacts risk factors.  Exercise Biomechanics Clinical staff led group instruction and group discussion with PowerPoint presentation and patient guidebook. To enhance the learning environment the use of posters, models and videos may be added. Patients will learn how the structural parts of their bodies function and how these functions impact their daily activities, movement, and exercise. Patients will learn how to promote a neutral spine, learn how to manage pain, and identify ways to improve their physical movement in order to promote healthy living. The purpose of this lesson is to expose patients to common physical limitations that impact physical activity. Participants will learn  practical ways to adapt and manage aches and pains, and to minimize their effect on regular exercise. Patients will learn how to maintain good posture while sitting, walking, and lifting.  Balance Training and Fall Prevention  Clinical staff led group instruction and group discussion with PowerPoint presentation and patient guidebook. To enhance the learning environment the use of  posters, models and videos may be added. At the conclusion of this workshop, patients will understand the importance of their sensorimotor skills (vision, proprioception, and the vestibular system) in maintaining their ability to balance as they age. Patients will apply a variety of balancing exercises that are appropriate for their current level of function. Patients will understand the common causes for poor balance, possible solutions to these problems, and ways to modify their physical environment in order to minimize their fall risk. The purpose of this lesson is to teach patients about the importance of maintaining balance as they age and ways to minimize their risk of falling.  WORKSHOPS   Nutrition:  Fueling a Ship broker led group instruction and group discussion with PowerPoint presentation and patient guidebook. To enhance the learning environment the use of posters, models and videos may be added. Patients will review the foundational principles of the Pritikin Eating Plan and understand what constitutes a serving size in each of the food groups. Patients will also learn Pritikin-friendly foods that are better choices when away from home and review make-ahead meal and snack options. Calorie density will be reviewed and applied to three nutrition priorities: weight maintenance, weight loss, and weight gain. The purpose of this lesson is to reinforce (in a group setting) the key concepts around what patients are recommended to eat and how to apply these guidelines when away from home by planning and selecting Pritikin-friendly options. Patients will understand how calorie density may be adjusted for different weight management goals.  Mindful Eating  Clinical staff led group instruction and group discussion with PowerPoint presentation and patient guidebook. To enhance the learning environment the use of posters, models and videos may be added. Patients will briefly review the  concepts of the Pritikin Eating Plan and the importance of low-calorie dense foods. The concept of mindful eating will be introduced as well as the importance of paying attention to internal hunger signals. Triggers for non-hunger eating and techniques for dealing with triggers will be explored. The purpose of this lesson is to provide patients with the opportunity to review the basic principles of the Pritikin Eating Plan, discuss the value of eating mindfully and how to measure internal cues of hunger and fullness using the Hunger Scale. Patients will also discuss reasons for non-hunger eating and learn strategies to use for controlling emotional eating.  Targeting Your Nutrition Priorities Clinical staff led group instruction and group discussion with PowerPoint presentation and patient guidebook. To enhance the learning environment the use of posters, models and videos may be added. Patients will learn how to determine their genetic susceptibility to disease by reviewing their family history. Patients will gain insight into the importance of diet as part of an overall healthy lifestyle in mitigating the impact of genetics and other environmental insults. The purpose of this lesson is to provide patients with the opportunity to assess their personal nutrition priorities by looking at their family history, their own health history and current risk factors. Patients will also be able to discuss ways of prioritizing and modifying the Pritikin Eating Plan for their highest risk areas  Menu  Clinical staff  led group instruction and group discussion with PowerPoint presentation and patient guidebook. To enhance the learning environment the use of posters, models and videos may be added. Using menus brought in from [employer], or printed from [employer], patients will apply the Pritikin dining out guidelines that were presented in the [employer] video. Patients will also be able to  practice these guidelines in a variety of provided scenarios. The purpose of this lesson is to provide patients with the opportunity to practice hands-on learning of the Pritikin Dining Out guidelines with actual menus and practice scenarios.  Label Reading Clinical staff led group instruction and group discussion with PowerPoint presentation and patient guidebook. To enhance the learning environment the use of posters, models and videos may be added. Patients will review and discuss the Pritikin label reading guidelines presented in Pritikin's Label Reading Educational series video. Using fool labels brought in from local grocery stores and markets, patients will apply the label reading guidelines and determine if the packaged food meet the Pritikin guidelines. The purpose of this lesson is to provide patients with the opportunity to review, discuss, and practice hands-on learning of the Pritikin Label Reading guidelines with actual packaged food labels. Cooking School  Pritikin's [employer] are designed to teach patients ways to prepare quick, simple, and affordable recipes at home. The importance of nutrition's role in chronic disease risk reduction is reflected in its emphasis in the overall Pritikin program. By learning how to prepare essential core Pritikin Eating Plan recipes, patients will increase control over what they eat; be able to customize the flavor of foods without the use of added salt, sugar, or fat; and improve the quality of the food they consume. By learning a set of core recipes which are easily assembled, quickly prepared, and affordable, patients are more likely to prepare more healthy foods at home. These workshops focus on convenient breakfasts, simple entres, side dishes, and desserts which can be prepared with minimal effort and are consistent with nutrition recommendations for cardiovascular risk reduction. Cooking [employer] are taught by a Interior and spatial designer (RD) who has been trained by the [employer]. The chef or RD has a clear understanding of the importance of minimizing - if not completely eliminating - added fat, sugar, and sodium in recipes. Throughout the series of Cooking School Workshop sessions, patients will learn about healthy ingredients and efficient methods of cooking to build confidence in their capability to prepare    Cooking School weekly topics:  Adding Flavor- Sodium-Free  Fast and Healthy Breakfasts  Powerhouse Plant-Based Proteins  Satisfying Salads and Dressings  Simple Sides and Sauces  International Cuisine-Spotlight on the [employer] Zones  Delicious Desserts  Savory Soups  [employer] - Meals in a Astronomer Appetizers and Snacks  Comforting Weekend Breakfasts  One-Pot Wonders   Fast Evening Meals  Landscape architect Your Pritikin Plate  WORKSHOPS   Healthy Mindset (Psychosocial):  Focused Goals, Sustainable Changes Clinical staff led group instruction and group discussion with PowerPoint presentation and patient guidebook. To enhance the learning environment the use of posters, models and videos may be added. Patients will be able to apply effective goal setting strategies to establish at least one personal goal, and then take consistent, meaningful action toward that goal. They will learn to identify common barriers to achieving personal goals and develop strategies to overcome them. Patients will also gain an understanding of how our mind-set can impact our  ability to achieve goals and the importance of cultivating a positive and growth-oriented mind-set. The purpose of this lesson is to provide patients with a deeper understanding of how to set and achieve personal goals, as well as the tools and strategies needed to overcome common obstacles which may arise along the way.  From Head to Heart: The Power of a Healthy Outlook  Clinical staff led group instruction and  group discussion with PowerPoint presentation and patient guidebook. To enhance the learning environment the use of posters, models and videos may be added. Patients will be able to recognize and describe the impact of emotions and mood on physical health. They will discover the importance of self-care and explore self-care practices which may work for them. Patients will also learn how to utilize the 4 C's to cultivate a healthier outlook and better manage stress and challenges. The purpose of this lesson is to demonstrate to patients how a healthy outlook is an essential part of maintaining good health, especially as they continue their cardiac rehab journey.  Healthy Sleep for a Healthy Heart Clinical staff led group instruction and group discussion with PowerPoint presentation and patient guidebook. To enhance the learning environment the use of posters, models and videos may be added. At the conclusion of this workshop, patients will be able to demonstrate knowledge of the importance of sleep to overall health, well-being, and quality of life. They will understand the symptoms of, and treatments for, common sleep disorders. Patients will also be able to identify daytime and nighttime behaviors which impact sleep, and they will be able to apply these tools to help manage sleep-related challenges. The purpose of this lesson is to provide patients with a general overview of sleep and outline the importance of quality sleep. Patients will learn about a few of the most common sleep disorders. Patients will also be introduced to the concept of "sleep hygiene," and discover ways to self-manage certain sleeping problems through simple daily behavior changes. Finally, the workshop will motivate patients by clarifying the links between quality sleep and their goals of heart-healthy living.   Recognizing and Reducing Stress Clinical staff led group instruction and group discussion with PowerPoint presentation and  patient guidebook. To enhance the learning environment the use of posters, models and videos may be added. At the conclusion of this workshop, patients will be able to understand the types of stress reactions, differentiate between acute and chronic stress, and recognize the impact that chronic stress has on their health. They will also be able to apply different coping mechanisms, such as reframing negative self-talk. Patients will have the opportunity to practice a variety of stress management techniques, such as deep abdominal breathing, progressive muscle relaxation, and/or guided imagery.  The purpose of this lesson is to educate patients on the role of stress in their lives and to provide healthy techniques for coping with it.  Learning Barriers/Preferences:  Learning Barriers/Preferences - 10/10/22 1142       Learning Barriers/Preferences   Learning Barriers Sight   wears glasses   Learning Preferences Computer/Internet;Audio;Group Instruction;Individual Instruction;Pictoral;Skilled Demonstration;Verbal Instruction;Video;Written Material             Education Topics:  Knowledge Questionnaire Score:  Knowledge Questionnaire Score - 10/10/22 1346       Knowledge Questionnaire Score   Pre Score 19/24             Core Components/Risk Factors/Patient Goals at Admission:  Personal Goals and Risk Factors at Admission - 10/10/22 1344  Core Components/Risk Factors/Patient Goals on Admission   Hypertension Yes    Intervention Provide education on lifestyle modifcations including regular physical activity/exercise, weight management, moderate sodium restriction and increased consumption of fresh fruit, vegetables, and low fat dairy, alcohol moderation, and smoking cessation.;Monitor prescription use compliance.    Expected Outcomes Short Term: Continued assessment and intervention until BP is < 140/46mm HG in hypertensive participants. < 130/63mm HG in hypertensive participants  with diabetes, heart failure or chronic kidney disease.;Long Term: Maintenance of blood pressure at goal levels.    Lipids Yes    Intervention Provide education and support for participant on nutrition & aerobic/resistive exercise along with prescribed medications to achieve LDL 70mg , HDL >40mg .    Expected Outcomes Short Term: Participant states understanding of desired cholesterol values and is compliant with medications prescribed. Participant is following exercise prescription and nutrition guidelines.;Long Term: Cholesterol controlled with medications as prescribed, with individualized exercise RX and with personalized nutrition plan. Value goals: LDL < 70mg , HDL > 40 mg.             Core Components/Risk Factors/Patient Goals Review:   Goals and Risk Factor Review     Row Name 10/14/22 1437 10/22/22 1741           Core Components/Risk Factors/Patient Goals Review   Personal Goals Review Hypertension;Lipids Hypertension;Lipids      Review Stanisha started intensive cardiac rehab on 10/14/22 and did well with exercise Dhwani started intensive cardiac rehab on 10/14/22 and is off to a good start to exercise. Vital signs have been stable      Expected Outcomes Lavergne will continue to participate in intensive cardiac rehab for exercise, nutrition and lifestyle modifications Ily will continue to participate in intensive cardiac rehab for exercise, nutrition and lifestyle modifications               Core Components/Risk Factors/Patient Goals at Discharge (Final Review):   Goals and Risk Factor Review - 10/22/22 1741       Core Components/Risk Factors/Patient Goals Review   Personal Goals Review Hypertension;Lipids    Review Sabria started intensive cardiac rehab on 10/14/22 and is off to a good start to exercise. Vital signs have been stable    Expected Outcomes Esly will continue to participate in intensive cardiac rehab for exercise, nutrition and lifestyle modifications              ITP Comments:  ITP Comments     Row Name 10/10/22 1138 10/14/22 1428 10/22/22 1740       ITP Comments Armanda Magic, MD: Medical Director.  Introduction to the [facility] / Intensive Cardiac Rehab.  Initial orientation packet reviewed with the patient. 30 day ITP Review. Amaia started intensive cardiac rehab on  10/13/21 and did well with exercise. 30 day ITP Review. Jaslynne started intensive cardiac rehab on  10/13/21 and is off to a good start to exercise              Comments: See ITP comments.

## 2022-10-23 ENCOUNTER — Encounter (HOSPITAL_COMMUNITY)
Admission: RE | Admit: 2022-10-23 | Discharge: 2022-10-23 | Disposition: A | Discharge status: Home / Self Care | Payer: Medicaid Other | Source: Ambulatory Visit | Attending: Cardiology | Admitting: Cardiology

## 2022-10-23 DIAGNOSIS — Z955 Presence of coronary angioplasty implant and graft: Secondary | ICD-10-CM

## 2022-10-23 DIAGNOSIS — I2129 ST elevation (STEMI) myocardial infarction involving other sites: Secondary | ICD-10-CM

## 2022-10-23 NOTE — Progress Notes (Signed)
Incomplete Session Note  Patient Details  Name: Marisa Fuller MRN: [id number] Date of Birth: August 03, 1960 Referring Provider:   Flowsheet Row CARDIAC REHAB PHASE II ORIENTATION from 10/10/2022 in [facility]  Referring Provider bridgette Cristal Deer, MD       Marisa Fuller did not complete her rehab session.  Marisa Fuller is not sure but thinks she may have have taken her daily medication's  twice. Blood pressure 100/60. I advised Marisa Fuller not to exercise today if she is not sure. Marisa Fuller plans to return on Monday as she has to attend her cousins funeral on Friday.Thayer Headings RN BSN

## 2022-10-25 ENCOUNTER — Encounter (HOSPITAL_COMMUNITY): Payer: Medicaid Other

## 2022-10-28 ENCOUNTER — Encounter (HOSPITAL_COMMUNITY)
Admission: RE | Admit: 2022-10-28 | Discharge: 2022-10-28 | Disposition: A | Discharge status: Home / Self Care | Payer: Medicaid Other | Source: Ambulatory Visit | Attending: Cardiology | Admitting: Cardiology

## 2022-10-28 DIAGNOSIS — I2129 ST elevation (STEMI) myocardial infarction involving other sites: Secondary | ICD-10-CM

## 2022-10-28 DIAGNOSIS — Z955 Presence of coronary angioplasty implant and graft: Secondary | ICD-10-CM

## 2022-10-30 ENCOUNTER — Encounter (HOSPITAL_COMMUNITY)
Admission: RE | Admit: 2022-10-30 | Discharge: 2022-10-30 | Disposition: A | Discharge status: Home / Self Care | Payer: Medicaid Other | Source: Ambulatory Visit | Attending: Cardiology | Admitting: Cardiology

## 2022-10-30 DIAGNOSIS — I2129 ST elevation (STEMI) myocardial infarction involving other sites: Secondary | ICD-10-CM

## 2022-10-30 DIAGNOSIS — Z955 Presence of coronary angioplasty implant and graft: Secondary | ICD-10-CM

## 2022-11-01 ENCOUNTER — Encounter (HOSPITAL_COMMUNITY)
Admission: RE | Admit: 2022-11-01 | Discharge: 2022-11-01 | Disposition: A | Discharge status: Home / Self Care | Payer: Medicaid Other | Source: Ambulatory Visit | Attending: Cardiology | Admitting: Cardiology

## 2022-11-01 DIAGNOSIS — Z955 Presence of coronary angioplasty implant and graft: Secondary | ICD-10-CM

## 2022-11-01 DIAGNOSIS — I2129 ST elevation (STEMI) myocardial infarction involving other sites: Secondary | ICD-10-CM

## 2022-11-04 ENCOUNTER — Encounter (HOSPITAL_COMMUNITY)
Admission: RE | Admit: 2022-11-04 | Discharge: 2022-11-04 | Disposition: A | Discharge status: Home / Self Care | Payer: Medicaid Other | Source: Ambulatory Visit | Attending: Cardiology | Admitting: Cardiology

## 2022-11-04 DIAGNOSIS — Z955 Presence of coronary angioplasty implant and graft: Secondary | ICD-10-CM

## 2022-11-04 DIAGNOSIS — I2129 ST elevation (STEMI) myocardial infarction involving other sites: Secondary | ICD-10-CM

## 2022-11-06 ENCOUNTER — Encounter (HOSPITAL_COMMUNITY)
Admission: RE | Admit: 2022-11-06 | Discharge: 2022-11-06 | Disposition: A | Discharge status: Home / Self Care | Payer: Medicaid Other | Source: Ambulatory Visit | Attending: Cardiology | Admitting: Cardiology

## 2022-11-06 DIAGNOSIS — Z955 Presence of coronary angioplasty implant and graft: Secondary | ICD-10-CM

## 2022-11-06 DIAGNOSIS — I2129 ST elevation (STEMI) myocardial infarction involving other sites: Secondary | ICD-10-CM

## 2022-11-08 ENCOUNTER — Encounter (HOSPITAL_COMMUNITY)
Admission: RE | Admit: 2022-11-08 | Discharge: 2022-11-08 | Disposition: A | Discharge status: Home / Self Care | Payer: Medicaid Other | Source: Ambulatory Visit | Attending: Cardiology | Admitting: Cardiology

## 2022-11-08 DIAGNOSIS — Z955 Presence of coronary angioplasty implant and graft: Secondary | ICD-10-CM

## 2022-11-08 DIAGNOSIS — I2129 ST elevation (STEMI) myocardial infarction involving other sites: Secondary | ICD-10-CM

## 2022-11-13 ENCOUNTER — Encounter (HOSPITAL_COMMUNITY)
Admission: RE | Admit: 2022-11-13 | Discharge: 2022-11-13 | Disposition: A | Discharge status: Home / Self Care | Payer: Medicaid Other | Source: Ambulatory Visit | Attending: Cardiology | Admitting: Cardiology

## 2022-11-13 DIAGNOSIS — I2129 ST elevation (STEMI) myocardial infarction involving other sites: Secondary | ICD-10-CM | POA: Diagnosis not present

## 2022-11-13 DIAGNOSIS — Z955 Presence of coronary angioplasty implant and graft: Secondary | ICD-10-CM

## 2022-11-14 ENCOUNTER — Ambulatory Visit (HOSPITAL_BASED_OUTPATIENT_CLINIC_OR_DEPARTMENT_OTHER): Payer: Medicaid Other | Admitting: Cardiology

## 2022-11-15 ENCOUNTER — Encounter (HOSPITAL_COMMUNITY)
Admission: RE | Admit: 2022-11-15 | Discharge: 2022-11-15 | Disposition: A | Discharge status: Home / Self Care | Payer: Medicaid Other | Source: Ambulatory Visit | Attending: Cardiology | Admitting: Cardiology

## 2022-11-15 DIAGNOSIS — I2129 ST elevation (STEMI) myocardial infarction involving other sites: Secondary | ICD-10-CM

## 2022-11-15 DIAGNOSIS — Z955 Presence of coronary angioplasty implant and graft: Secondary | ICD-10-CM

## 2022-11-18 ENCOUNTER — Encounter (HOSPITAL_COMMUNITY)
Admission: RE | Admit: 2022-11-18 | Discharge: 2022-11-18 | Disposition: A | Discharge status: Home / Self Care | Payer: Medicaid Other | Source: Ambulatory Visit | Attending: Cardiology | Admitting: Cardiology

## 2022-11-18 DIAGNOSIS — Z955 Presence of coronary angioplasty implant and graft: Secondary | ICD-10-CM | POA: Insufficient documentation

## 2022-11-18 DIAGNOSIS — I2129 ST elevation (STEMI) myocardial infarction involving other sites: Secondary | ICD-10-CM | POA: Insufficient documentation

## 2022-11-19 ENCOUNTER — Encounter: Payer: Self-pay | Admitting: *Deleted

## 2022-11-19 NOTE — Progress Notes (Signed)
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 Swallow whole., Disp: , Rfl:    atorvastatin (LIPITOR) 40 MG tablet, Take 1 tablet (40 mg total) by mouth daily., Disp: 90 tablet, Rfl: 3   cetirizine (ZYRTEC) 10 MG tablet, Take 10 mg by mouth at bedtime., Disp: , Rfl:    fluticasone (FLONASE) 50 MCG/ACT nasal spray, Place 1 spray into both nostrils daily., Disp: 18.2 mL, Rfl: 2   losartan (COZAAR) 50 MG tablet, Take 1 tablet (50 mg total) by mouth daily., Disp: 30 tablet, Rfl: 5   metoprolol succinate (TOPROL XL) 25 MG 24 hr tablet, Take 1 tablet (25 mg total) by mouth daily., Disp: 30 tablet, Rfl: 5   Multiple Vitamins-Minerals (MULTIVITAMIN WOMEN PO), Take 1 tablet by mouth daily. (Patient not taking: Reported on 10/10/2022), Disp: , Rfl:    nitroGLYCERIN (NITROSTAT) 0.4 MG SL tablet, Place 1 tablet (0.4 mg total) under the tongue every 5 (five) minutes as needed for chest pain., Disp: 25 tablet, Rfl: 1   Study - EVOLVE-MI - evolocumab (REPATHA) 140 mg/mL SQ injection (PI-Stuckey), Inject 1 mL (140 mg total) into the skin every 14 (fourteen) days. Inject 1 mL (140 mg) subcutaneously into abdomen, thigh, or upper arm every 14 days. Rotate injection sites and do not  inject into areas where skin is tender, bruised, or red. Please contact [facility] for any questions or concerns regarding this medication., Disp: 12 mL, Rfl: 0   ticagrelor (BRILINTA) 90 MG TABS tablet, Take 1 tablet (90 mg total) by mouth 2 (two) times daily., Disp: 60 tablet, Rfl: 11  Current Facility-Administered Medications:    Study - EVOLVE-MI - evolocumab (REPATHA) 140 mg/mL SQ injection (PI-Stuckey), 140 mg, Subcutaneous, Q14 Days, Herby Abraham, Marisa Fuller, 140 mg at 07/24/22 1307  Past Medical History: Past Medical History:  Diagnosis Date   Allergy    Anemia    sickle cell trait   CAD (coronary artery disease) 07/18/2022   DES to first obtuse marginal   Clotting disorder (HCC)    DVT (deep venous thrombosis) (HCC)    Remote DVT per notes   GERD (gastroesophageal reflux disease)    Hyperlipidemia    Hypertension    Sickle cell anemia (HCC)    trait    Tobacco Use: Social History   Tobacco Use  Smoking Status Former  Smokeless Tobacco Never    Labs: Review Flowsheet  More data exists      Latest Ref Rng & Units 08/31/2019 07/18/2022 07/19/2022 07/20/2022 09/24/2022  Labs for ITP Cardiac and Pulmonary Rehab  Cholestrol 100 - 199 mg/dL 102  - 725  366  85   LDL (calc) 0 - 99 mg/dL 440  - 347  425  16   HDL-C >39 mg/dL 53  -  52  45  53   Trlycerides 0 - 149 mg/dL 161  - 096  045  77   Hemoglobin A1c 4.8 - 5.6 % - 6.3  - - -  TCO2 22 - 32 mmol/L - 20  - - -    Capillary Blood Glucose: No results found for: "GLUCAP"   Exercise Target Goals: Exercise Program Goal: Individual exercise prescription set using results from initial 6 min walk test and THRR while considering  patient's activity barriers and safety.   Exercise Prescription Goal: Initial exercise prescription builds to 30-45 minutes a day of aerobic activity, 2-3 days per week.  Home exercise guidelines will be given to patient during program as part of exercise prescription that the participant  will acknowledge.  Activity Barriers & Risk Stratification:  Activity Barriers & Cardiac Risk Stratification - 10/10/22 1405       Activity Barriers & Cardiac Risk Stratification   Activity Barriers Deconditioning;Shortness of Breath    Cardiac Risk Stratification High             6 Minute Walk:  6 Minute Walk     Row Name 10/10/22 1212         6 Minute Walk   Phase Initial     Distance 1184 feet     Walk Time 6 minutes     # of Rest Breaks 0     MPH 2.24     METS 3.21     RPE 8     Perceived Dyspnea  1     VO2 Peak 11.22     Symptoms Yes (comment)     Comments SOB, RPD = 1, Fatigue     Resting HR 56 bpm     Resting BP 126/70     Resting Oxygen Saturation  100 %     Exercise Oxygen Saturation  during 6 min walk 100 %     Max Ex. HR 84 bpm     Max Ex. BP 132/78     2 Minute Post BP 120/70              Oxygen Initial Assessment:   Oxygen Re-Evaluation:   Oxygen Discharge (Final Oxygen Re-Evaluation):   Initial Exercise Prescription:  Initial Exercise Prescription - 10/10/22 1400       Date of Initial Exercise RX and Referring Provider   Date 10/10/22    Referring Provider bridgette Cristal Deer, Marisa Fuller    Expected Discharge Date 12/20/22      Bike   Level 1    Watts 25    Minutes 15    METs 3.2      NuStep   Level 1    SPM 75    Minutes 15    METs 3.2      Prescription Details   Frequency (times per week) 3    Duration Progress to 30 minutes of continuous aerobic without signs/symptoms of physical distress      Intensity   THRR 40-80% of Max Heartrate 62-123    Ratings of Perceived Exertion 11-13    Perceived Dyspnea 0-4      Progression   Progression Continue progressive overload as per policy without signs/symptoms or physical distress.      Resistance Training   Training Prescription Yes    Weight 3 lbs    Reps 10-15             Perform Capillary Blood Glucose checks as needed.  Exercise Prescription Changes:  Exercise Prescription Changes     Row Name 10/14/22 1400 10/30/22 1500 11/13/22 1500         Response to Exercise   Blood Pressure (Admit) 114/64 100/70 114/60     Blood Pressure (Exercise) 132/80 120/56 128/78     Blood Pressure (Exit) 98/72 115/73 104/56     Heart Rate (Admit) 60 bpm 65 bpm 65 bpm     Heart Rate (Exercise) 82 bpm 86 bpm 84 bpm     Heart Rate (Exit) 56 bpm 68 bpm 74 bpm     Rating of Perceived Exertion (Exercise) 12 12 12      Symptoms None None None     Comments Pt's first day in the CRP2 program Reviewed METs Reviewed METs and goals     Duration Continue with 30 min of aerobic exercise without signs/symptoms of physical distress. Continue with 30 min of aerobic exercise without signs/symptoms of physical distress. Continue with 30 min of aerobic exercise without signs/symptoms of physical distress.     Intensity THRR unchanged THRR unchanged THRR unchanged       Progression   Progression Continue to follow PAD protocol Continue to progress workloads to maintain intensity without signs/symptoms of physical distress. Continue to progress workloads to maintain intensity without signs/symptoms of physical distress.     Average METs 2.2 3.45 3.05       Resistance Training   Training Prescription Yes No No     Weight 3 lbs No weight on Wednesdays No weight on Wednesdays     Reps 10-15 -- --     Time 10 Minutes -- --       Interval Training   Interval Training No No No       Bike   Level 1 -- --     Minutes 15 -- --     METs 2.3 -- --       NuStep   Level 1 2 2      SPM 86 104 101     Minutes 15 15 15      METs 2.1 2.2 2.1       Recumbant Elliptical   Level -- 1 1     RPM -- 49 50     Watts -- 74 71     Minutes -- 15 15     METs -- 4.7 4              Exercise Comments:   Exercise Comments     Row Name 10/14/22 1410 10/30/22 1500 11/14/22 1113       Exercise Comments Pt's first day in the CRP2 program. Pt exercise today with no complaints.  Reviewed METs. Pt is making good progress. Reviewed METs and goals. Pt is progressin to goals. Will consider increases on both modalities next session.              Exercise Goals and Review:   Exercise Goals     Row Name 10/10/22 1404             Exercise Goals   Increase Physical Activity Yes       Intervention Provide advice, education, support and counseling about physical activity/exercise needs.;Develop an individualized exercise prescription for aerobic and resistive training based on initial evaluation findings, risk stratification, comorbidities and participant's personal goals.       Expected Outcomes Short Term: Attend rehab on a regular basis to increase amount of physical activity.;Long Term: Add in home exercise to make exercise part  of routine and to increase amount of physical activity.;Long Term: Exercising regularly at least 3-5 days a week.       Increase Strength and Stamina Yes       Intervention Provide advice, education, support and counseling about physical activity/exercise needs.;Develop an individualized exercise prescription for aerobic and resistive training based on initial evaluation findings, risk stratification, comorbidities and participant's personal goals.       Expected Outcomes Short Term: Increase workloads from initial exercise prescription for resistance, speed, and METs.;Short Term: Perform resistance training exercises routinely during rehab and add in resistance training at home;Long Term: Improve cardiorespiratory fitness, muscular endurance and strength as measured by increased METs and functional capacity ( )       Able to understand and use rate of perceived exertion (RPE) scale Yes       Intervention Provide education and explanation on how to use RPE scale       Expected Outcomes Short Term: Able to use RPE daily in rehab to express subjective intensity level;Long Term:  Able to use RPE to guide intensity level when exercising  independently       Knowledge and understanding of Target Heart Rate Range (THRR) Yes       Intervention Provide education and explanation of THRR including how the numbers were predicted and where they are located for reference       Expected Outcomes Short Term: Able to state/look up THRR;Long Term: Able to use THRR to govern intensity when exercising independently;Short Term: Able to use daily as guideline for intensity in rehab       Understanding of Exercise Prescription Yes       Intervention Provide education, explanation, and written materials on patient's individual exercise prescription       Expected Outcomes Short Term: Able to explain program exercise prescription;Long Term: Able to explain home exercise prescription to exercise independently                Exercise Goals Re-Evaluation :  Exercise Goals Re-Evaluation     Row Name 10/14/22 1409 11/13/22 1500           Exercise Goal Re-Evaluation   Exercise Goals Review Increase Physical Activity;Increase Strength and Stamina;Able to understand and use rate of perceived exertion (RPE) scale;Knowledge and understanding of Target Heart Rate Range (THRR);Understanding of Exercise Prescription Increase Physical Activity;Increase Strength and Stamina;Able to understand and use rate of perceived exertion (RPE) scale;Knowledge and understanding of Target Heart Rate Range (THRR);Understanding of Exercise Prescription      Comments Pt's first day in the cRP2 program. Pt understands the exercise Rx, RPE scale and THRR, Reviewed METs and goals. Pt's short term goals are increased strength and stamina and to be able to walk a mile. Pt voices that she has achieved these goals. Pt is feeling stronger and walking the mile without difficulty.      Expected Outcomes Will continue to montior patient and progress exercise workloads as tolerated. Will continue to montior patient and progress exercise workloads as tolerated.                Discharge Exercise Prescription (Final Exercise Prescription Changes):  Exercise Prescription Changes - 11/13/22 1500       Response to Exercise   Blood Pressure (Admit) 114/60    Blood Pressure (Exercise) 128/78    Blood Pressure (Exit) 104/56    Heart Rate (Admit) 65 bpm    Heart Rate (Exercise) 84 bpm    Heart Rate (Exit)  74 bpm    Rating of Perceived Exertion (Exercise) 12    Symptoms None    Comments Reviewed METs and goals    Duration Continue with 30 min of aerobic exercise without signs/symptoms of physical distress.    Intensity THRR unchanged      Progression   Progression Continue to progress workloads to maintain intensity without signs/symptoms of physical distress.    Average METs 3.05      Resistance Training   Training Prescription No    Weight No weight on Wednesdays      Interval Training   Interval Training No      NuStep   Level 2    SPM 101    Minutes 15    METs 2.1      Recumbant Elliptical   Level 1    RPM 50    Watts 71    Minutes 15    METs 4             Nutrition:  Target Goals: Understanding of nutrition guidelines, daily intake of sodium 1500mg , cholesterol 200mg , calories 30% from fat and 7% or less from saturated fats, daily to have 5 or more servings of fruits and vegetables.  Biometrics:  Pre Biometrics - 10/10/22 1124       Pre Biometrics   Waist Circumference 35 inches    Hip Circumference 38 inches    Waist to Hip Ratio 0.92 %    Triceps Skinfold 16 mm    % Body Fat 33.2 %    Grip Strength 41 kg    Flexibility 13 in    Single Leg Stand 30 seconds              Nutrition Therapy Plan and Nutrition Goals:  Nutrition Therapy & Goals - 11/14/22 0909       Nutrition Therapy   Diet Heart healthy diet    Drug/Food Interactions Statins/Certain Fruits      Personal Nutrition Goals   Nutrition Goal Patient to identify strategies for reducing cardiovascular risk by attending the [employer] education and  nutrition series weekly.    Personal Goal #2 Patient to improve diet quality by using the plate method as a guide for meal planning to include lean protein/plant protein, fruits, vegetables, whole grains, nonfat dairy as part of a well-balanced diet.    Personal Goal #3 Patient to reduce sodium intake to 1500mg  per day    Comments Goals in action. Marisa Fuller continues to attend the [employer] and nutrition series regularly. She has started making many dietary changes including reduced sodium and increased dietary fiber and implemented many Pritik[employer] recipes. She is down 3.7# since starting with our program. Patient will benefit from participation in intensive cardiac rehab for nutrition, exercise, and lifestyle modification.      Intervention Plan   Intervention Prescribe, educate and counsel regarding individualized specific dietary modifications aiming towards targeted core components such as weight, hypertension, lipid management, diabetes, heart failure and other comorbidities.;Nutrition handout(s) given to patient.    Expected Outcomes Short Term Goal: Understand basic principles of dietary content, such as calories, fat, sodium, cholesterol and nutrients.;Long Term Goal: Adherence to prescribed nutrition plan.             Nutrition Assessments:  Nutrition Assessments - 10/17/22 1629       Rate Your Plate Scores   Pre Score 68            MEDIFICTS Score Key: ?70 Need to make dietary  changes  40-70 Heart Healthy Diet ? 40 Therapeutic Level Cholesterol Diet   Flowsheet Row CARDIAC REHAB PHASE II EXERCISE from 10/16/2022 in [facility]  Picture Your Plate Total Score on Admission 68      Picture Your Plate Scores: <09 Unhealthy dietary pattern with much room for improvement. 41-50 Dietary pattern unlikely to meet recommendations for good health and room for improvement. 51-60 More healthful dietary pattern, with  some room for improvement.  >60 Healthy dietary pattern, although there may be some specific behaviors that could be improved.    Nutrition Goals Re-Evaluation:  Nutrition Goals Re-Evaluation     Row Name 10/14/22 1508 11/14/22 0909           Goals   Current Weight 149 lb 4 oz (67.7 kg) 145 lb 11.6 oz (66.1 kg)      Comment lipids drastically improved WNL, lipoprotein A169.2, AST 45, ALT 96 no new labs; most recent labs lipids drastically improved WNL, lipoprotein A169.2, AST 45, ALT 96      Expected Outcome Patient will benefit from participation in intensive cardiac rehab for nutrition, exercise, and lifestyle modification. Goals in action. Marisa Fuller continues to attend the [employer] and nutrition series regularly. She has started making many dietary changes including reduced sodium and increased dietary fiber and implemented many Pritik[employer] recipes. She is down 3.7# since starting with our program. Patient will benefit from participation in intensive cardiac rehab for nutrition, exercise, and lifestyle modification.               Nutrition Goals Re-Evaluation:  Nutrition Goals Re-Evaluation     Row Name 10/14/22 1508 11/14/22 0909           Goals   Current Weight 149 lb 4 oz (67.7 kg) 145 lb 11.6 oz (66.1 kg)      Comment lipids drastically improved WNL, lipoprotein A169.2, AST 45, ALT 96 no new labs; most recent labs lipids drastically improved WNL, lipoprotein A169.2, AST 45, ALT 96      Expected Outcome Patient will benefit from participation in intensive cardiac rehab for nutrition, exercise, and lifestyle modification. Goals in action. Marisa Fuller continues to attend the [employer] and nutrition series regularly. She has started making many dietary changes including reduced sodium and increased dietary fiber and implemented many Pritikin recipes. She is down 3.7# since starting with our program. Patient will benefit from participation in intensive cardiac rehab for  nutrition, exercise, and lifestyle modification.               Nutrition Goals Discharge (Final Nutrition Goals Re-Evaluation):  Nutrition Goals Re-Evaluation - 11/14/22 0909       Goals   Current Weight 145 lb 11.6 oz (66.1 kg)    Comment no new labs; most recent labs lipids drastically improved WNL, lipoprotein A169.2, AST 45, ALT 96    Expected Outcome Goals in action. Marisa Fuller continues to attend the [employer] and nutrition series regularly. She has started making many dietary changes including reduced sodium and increased dietary fiber and implemented many Pritikin recipes. She is down 3.7# since starting with our program. Patient will benefit from participation in intensive cardiac rehab for nutrition, exercise, and lifestyle modification.             Psychosocial: Target Goals: Acknowledge presence or absence of significant depression and/or stress, maximize coping skills, provide positive support system. Participant is able to verbalize types and ability to use techniques  and skills needed for reducing stress and depression.  Initial Review & Psychosocial Screening:  Initial Psych Review & Screening - 10/10/22 1142       Initial Review   Current issues with None Identified      Family Dynamics   Good Support System? Yes   Pt has son and daughter for support     Barriers   Psychosocial barriers to participate in program There are no identifiable barriers or psychosocial needs.      Screening Interventions   Interventions Encouraged to exercise             Quality of Life Scores:  Quality of Life - 10/10/22 1406       Quality of Life   Select Quality of Life      Quality of Life Scores   Health/Function Pre 27.2 %    Socioeconomic Pre 30 %    Psych/Spiritual Pre 30 %    Family Pre 30 %    GLOBAL Pre 28.8 %            Scores of 19 and below usually indicate a poorer quality of life in these areas.  A difference of  2-3 points is a  clinically meaningful difference.  A difference of 2-3 points in the total score of the Quality of Life Index has been associated with significant improvement in overall quality of life, self-image, physical symptoms, and general health in studies assessing change in quality of life.  PHQ-9: Review Flowsheet       10/10/2022 07/30/2022  Depression screen PHQ 2/9  Decreased Interest 0 0  Down, Depressed, Hopeless 0 0  PHQ - 2 Score 0 0  Altered sleeping 0 0  Tired, decreased energy 3 3  Change in appetite 0 0  Feeling bad or failure about yourself  0 0  Trouble concentrating 0 0  Moving slowly or fidgety/restless 0 0  Suicidal thoughts 0 0  PHQ-9 Score 3 3  Difficult doing work/chores Somewhat difficult Not difficult at all   Interpretation of Total Score  Total Score Depression Severity:  1-4 = Minimal depression, 5-9 = Mild depression, 10-14 = Moderate depression, 15-19 = Moderately severe depression, 20-27 = Severe depression   Psychosocial Evaluation and Intervention:   Psychosocial Re-Evaluation:  Psychosocial Re-Evaluation     Row Name 10/14/22 1434 10/22/22 1740 11/19/22 1540         Psychosocial Re-Evaluation   Current issues with None Identified None Identified None Identified     Interventions Encouraged to attend Cardiac Rehabilitation for the exercise Encouraged to attend Cardiac Rehabilitation for the exercise Encouraged to attend Cardiac Rehabilitation for the exercise     Continue Psychosocial Services  No Follow up required No Follow up required No Follow up required              Psychosocial Discharge (Final Psychosocial Re-Evaluation):  Psychosocial Re-Evaluation - 11/19/22 1540       Psychosocial Re-Evaluation   Current issues with None Identified    Interventions Encouraged to attend Cardiac Rehabilitation for the exercise    Continue Psychosocial Services  No Follow up required             Vocational Rehabilitation: Provide vocational  rehab assistance to qualifying candidates.   Vocational Rehab Evaluation & Intervention:  Vocational Rehab - 10/10/22 1142       Initial Vocational Rehab Evaluation & Intervention   Assessment shows need for Vocational Rehabilitation No   Pt denies any  vocational needs at this time            Education: Education Goals: Education classes will be provided on a weekly basis, covering required topics. Participant will state understanding/return demonstration of topics presented.    Education     Row Name 10/14/22 1500     Education   Cardiac Education Topics [employer]   Licensed conveyancer Nutrition   Nutrition Facts on Fat   Instruction Review Code 1- Verbalizes Understanding   Class Start Time 1400   Class Stop Time 1439   Class Time Calculation (min) 39 min    Row Name 10/16/22 1600     Education   Cardiac Education Topics [employer]   Customer service manager   Weekly Topic Fast Evening Meals   Instruction Review Code 1- Verbalizes Understanding   Class Start Time 1400   Class Stop Time 1440   Class Time Calculation (min) 40 min    Row Name 10/21/22 1700     Education   Cardiac Education Topics [employer]   Geographical information systems officer Psychosocial   Psychosocial Workshop Healthy Sleep for a Healthy Heart   Instruction Review Code 1- Verbalizes Understanding   Class Start Time 1145   Class Stop Time 1233   Class Time Calculation (min) 48 min    Row Name 10/23/22 1400     Education   Cardiac Education Topics [employer]   Customer service manager   Weekly Topic International Cuisine- Spotlight on the [employer] Zones   Instruction Review Code 1- Verbalizes Understanding   Class Start Time 1140   Class Stop Time 1212   Class Time Calculation (min) 32 min    Row Name 10/28/22 1600      Education   Cardiac Education Topics [employer]   Glass blower/designer Nutrition   Instruction Review Code 1- Verbalizes Understanding   Class Start Time 1145   Class Stop Time 1230   Class Time Calculation (min) 45 min    Row Name 10/30/22 1300     Education   Cardiac Education Topics [employer]   Customer service manager   Weekly Topic Simple Sides and Sauces   Instruction Review Code 1- Verbalizes Understanding   Class Start Time 1140   Class Stop Time 1215   Class Time Calculation (min) 35 min    Row Name 11/01/22 1300     Education   Cardiac Education Topics [employer]   Nurse, children's Exercise Physiologist   Select Psychosocial   Psychosocial How Our Thoughts Can Heal Our Hearts   Instruction Review Code 1- Verbalizes Understanding   Class Start Time 1150   Class Stop Time 1226   Class Time Calculation (min) 36 min    Row Name 11/04/22 1200     Education   Cardiac Education Topics [employer]in     Workshops   Biomedical scientist Psychosocial   Psychosocial Workshop From Head to Heart: The Power of a Healthy Outlook   Instruction Review Code 1- Verbalizes Understanding   Class Start Time 1153  Class Stop Time 1237   Class Time Calculation (min) 44 min    Row Name 11/06/22 1300     Education   Cardiac Education Topics [employer]ritikin   Customer service manager   Weekly Topic Powerhouse Plant-Based Proteins   Instruction Review Code 1- Verbalizes Understanding   Class Start Time 1135   Class Stop Time 1210   Class Time Calculation (min) 35 min    Row Name 11/08/22 1400     Education   Cardiac Education Topics Pritikin   Psychologist, forensic General Education   General Education Hypertension and Heart Disease   Instruction Review Code  1- Verbalizes Understanding   Class Start Time 1145   Class Stop Time 1220   Class Time Calculation (min) 35 min    Row Name 11/13/22 1500     Education   Cardiac Education Topics Pritikin   Customer service manager   Weekly Topic Adding Flavor - Sodium-Free   Instruction Review Code 1- Verbalizes Understanding   Class Start Time 1400   Class Stop Time 1445   Class Time Calculation (min) 45 min    Row Name 11/15/22 1200     Education   Cardiac Education Topics Pritikin   Hospital doctor Education   General Education Heart Disease Risk Reduction   Instruction Review Code 1- Verbalizes Understanding   Class Start Time 1135   Class Stop Time 1210   Class Time Calculation (min) 35 min    Row Name 11/18/22 1300     Education   Cardiac Education Topics Pritikin   [employer] Workshops     Workshops   Educator Exercise Physiologist   Select Exercise   Exercise Workshop Location manager and Fall Prevention   Instruction Review Code 1- Verbalizes Understanding   Class Start Time 1146   Class Stop Time 1226   Class Time Calculation (min) 40 min            Core Videos: Exercise    Move It!  Clinical staff conducted group or individual video education with verbal and written material and guidebook.  Patient learns the recommended Pritikin exercise program. Exercise with the goal of living a long, healthy life. Some of the health benefits of exercise include controlled diabetes, healthier blood pressure levels, improved cholesterol levels, improved heart and lung capacity, improved sleep, and better body composition. Everyone should speak with their doctor before starting or changing an exercise routine.  Biomechanical Limitations Clinical staff conducted group or individual video education with verbal and written material and guidebook.  Patient learns how  biomechanical limitations can impact exercise and how we can mitigate and possibly overcome limitations to have an impactful and balanced exercise routine.  Body Composition Clinical staff conducted group or individual video education with verbal and written material and guidebook.  Patient learns that body composition (ratio of muscle mass to fat mass) is a key component to assessing overall fitness, rather than body weight alone. Increased fat mass, especially visceral belly fat, can put [place] at increased risk for metabolic syndrome, type 2 diabetes, heart disease, and even death. It is recommended to combine diet and exercise (cardiovascular and resistance training) to improve your body composition. Seek guidance from  your physician and exercise physiologist before implementing an exercise routine.  Exercise Action Plan Clinical staff conducted group or individual video education with verbal and written material and guidebook.  Patient learns the recommended strategies to achieve and enjoy long-term exercise adherence, including variety, self-motivation, self-efficacy, and positive decision making. Benefits of exercise include fitness, good health, weight management, more energy, better sleep, less stress, and overall well-being.  Medical   Heart Disease Risk Reduction Clinical staff conducted group or individual video education with verbal and written material and guidebook.  Patient learns our heart is our most vital organ as it circulates oxygen, nutrients, white blood cells, and hormones throughout the entire body, and carries waste away. Data supports a plant-based eating plan like the Pritikin Program for its effectiveness in slowing progression of and reversing heart disease. The video provides a number of recommendations to address heart disease.   Metabolic Syndrome and Belly Fat  Clinical staff conducted group or individual video education with verbal and written material and guidebook.   Patient learns what metabolic syndrome is, how it leads to heart disease, and how one can reverse it and keep it from coming back. You have metabolic syndrome if you have 3 of the following 5 criteria: abdominal obesity, high blood pressure, high triglycerides, low HDL cholesterol, and high blood sugar.  Hypertension and Heart Disease Clinical staff conducted group or individual video education with verbal and written material and guidebook.  Patient learns that high blood pressure, or hypertension, is very common in the [place]. Hypertension is largely due to excessive salt intake, but other important risk factors include being overweight, physical inactivity, drinking too much alcohol, smoking, and not eating enough potassium from fruits and vegetables. High blood pressure is a leading risk factor for heart attack, stroke, congestive heart failure, dementia, kidney failure, and premature death. Long-term effects of excessive salt intake include stiffening of the arteries and thickening of heart muscle and organ damage. Recommendations include ways to reduce hypertension and the risk of heart disease.  Diseases of Our Time - Focusing on Diabetes Clinical staff conducted group or individual video education with verbal and written material and guidebook.  Patient learns why the best way to stop diseases of our time is prevention, through food and other lifestyle changes. Medicine (such as prescription pills and surgeries) is often only a Band-Aid on the problem, not a long-term solution. Most common diseases of our time include obesity, type 2 diabetes, hypertension, heart disease, and cancer. The Pritikin Program is recommended and has been proven to help reduce, reverse, and/or prevent the damaging effects of metabolic syndrome.  Nutrition   Overview of the Pritikin Eating Plan  Clinical staff conducted group or individual video education with verbal and written material and guidebook.  Patient  learns about the Pritikin Eating Plan for disease risk reduction. The Pritikin Eating Plan emphasizes a wide variety of unrefined, minimally-processed carbohydrates, like fruits, vegetables, whole grains, and legumes. Go, Caution, and Stop food choices are explained. Plant-based and lean animal proteins are emphasized. Rationale provided for low sodium intake for blood pressure control, low added sugars for blood sugar stabilization, and low added fats and oils for coronary artery disease risk reduction and weight management.  Calorie Density  Clinical staff conducted group or individual video education with verbal and written material and guidebook.  Patient learns about calorie density and how it impacts the Pritikin Eating Plan. Knowing the characteristics of the food you choose will help you decide whether those  foods will lead to weight gain or weight loss, and whether you want to consume more or less of them. Weight loss is usually a side effect of the Pritikin Eating Plan because of its focus on low calorie-dense foods.  Label Reading  Clinical staff conducted group or individual video education with verbal and written material and guidebook.  Patient learns about the Pritikin recommended label reading guidelines and corresponding recommendations regarding calorie density, added sugars, sodium content, and whole grains.  Dining Out - Part 1  Clinical staff conducted group or individual video education with verbal and written material and guidebook.  Patient learns that restaurant meals can be sabotaging because they can be so high in calories, fat, sodium, and/or sugar. Patient learns recommended strategies on how to positively address this and avoid unhealthy pitfalls.  Facts on Fats  Clinical staff conducted group or individual video education with verbal and written material and guidebook.  Patient learns that lifestyle modifications can be just as effective, if not more so, as many  medications for lowering your risk of heart disease. A Pritikin lifestyle can help to reduce your risk of inflammation and atherosclerosis (cholesterol build-up, or plaque, in the artery walls). Lifestyle interventions such as dietary choices and physical activity address the cause of atherosclerosis. A review of the types of fats and their impact on blood cholesterol levels, along with dietary recommendations to reduce fat intake is also included.  Nutrition Action Plan  Clinical staff conducted group or individual video education with verbal and written material and guidebook.  Patient learns how to incorporate Pritikin recommendations into their lifestyle. Recommendations include planning and keeping personal health goals in mind as an important part of their success.  Healthy Mind-Set    Healthy Minds, Bodies, Hearts  Clinical staff conducted group or individual video education with verbal and written material and guidebook.  Patient learns how to identify when they are stressed. Video will discuss the impact of that stress, as well as the many benefits of stress management. Patient will also be introduced to stress management techniques. The way we think, act, and feel has an impact on our hearts.  How Our Thoughts Can Heal Our Hearts  Clinical staff conducted group or individual video education with verbal and written material and guidebook.  Patient learns that negative thoughts can cause depression and anxiety. This can result in negative lifestyle behavior and serious health problems. Cognitive behavioral therapy is an effective method to help control our thoughts in order to change and improve our emotional outlook.  Additional Videos:  Exercise    Improving Performance  Clinical staff conducted group or individual video education with verbal and written material and guidebook.  Patient learns to use a non-linear approach by alternating intensity levels and lengths of time spent  exercising to help burn more calories and lose more body fat. Cardiovascular exercise helps improve heart health, metabolism, hormonal balance, blood sugar control, and recovery from fatigue. Resistance training improves strength, endurance, balance, coordination, reaction time, metabolism, and muscle mass. Flexibility exercise improves circulation, posture, and balance. Seek guidance from your physician and exercise physiologist before implementing an exercise routine and learn your capabilities and proper form for all exercise.  Introduction to Yoga  Clinical staff conducted group or individual video education with verbal and written material and guidebook.  Patient learns about yoga, a discipline of the coming together of mind, breath, and body. The benefits of yoga include improved flexibility, improved range of motion, better posture and core  strength, increased lung function, weight loss, and positive self-image. Yoga's heart health benefits include lowered blood pressure, healthier heart rate, decreased cholesterol and triglyceride levels, improved immune function, and reduced stress. Seek guidance from your physician and exercise physiologist before implementing an exercise routine and learn your capabilities and proper form for all exercise.  Medical   Aging: Enhancing Your Quality of Life  Clinical staff conducted group or individual video education with verbal and written material and guidebook.  Patient learns key strategies and recommendations to stay in good physical health and enhance quality of life, such as prevention strategies, having an advocate, securing a Health Care Proxy and Power of Attorney, and keeping a list of medications and system for tracking them. It also discusses how to avoid risk for bone loss.  Biology of Weight Control  Clinical staff conducted group or individual video education with verbal and written material and guidebook.  Patient learns that weight gain occurs  because we consume more calories than we burn (eating more, moving less). Even if your body weight is normal, you may have higher ratios of fat compared to muscle mass. Too much body fat puts you at increased risk for cardiovascular disease, heart attack, stroke, type 2 diabetes, and obesity-related cancers. In addition to exercise, following the Pritikin Eating Plan can help reduce your risk.  Decoding Lab Results  Clinical staff conducted group or individual video education with verbal and written material and guidebook.  Patient learns that lab test reflects one measurement whose values change over time and are influenced by many factors, including medication, stress, sleep, exercise, food, hydration, pre-existing medical conditions, and more. It is recommended to use the knowledge from this video to become more involved with your lab results and evaluate your numbers to speak with your doctor.   Diseases of Our Time - Overview  Clinical staff conducted group or individual video education with verbal and written material and guidebook.  Patient learns that according to the CDC, 50% to 70% of chronic diseases (such as obesity, type 2 diabetes, elevated lipids, hypertension, and heart disease) are avoidable through lifestyle improvements including healthier food choices, listening to satiety cues, and increased physical activity.  Sleep Disorders Clinical staff conducted group or individual video education with verbal and written material and guidebook.  Patient learns how good quality and duration of sleep are important to overall health and well-being. Patient also learns about sleep disorders and how they impact health along with recommendations to address them, including discussing with a physician.  Nutrition  Dining Out - Part 2 Clinical staff conducted group or individual video education with verbal and written material and guidebook.  Patient learns how to plan ahead and communicate in  order to maximize their dining experience in a healthy and nutritious manner. Included are recommended food choices based on the type of restaurant the patient is visiting.   Fueling a Banker conducted group or individual video education with verbal and written material and guidebook.  There is a strong connection between our food choices and our health. Diseases like obesity and type 2 diabetes are very prevalent and are in large-part due to lifestyle choices. The Pritikin Eating Plan provides plenty of food and hunger-curbing satisfaction. It is easy to follow, affordable, and helps reduce health risks.  Menu Workshop  Clinical staff conducted group or individual video education with verbal and written material and guidebook.  Patient learns that restaurant meals can sabotage health goals because they are  often packed with calories, fat, sodium, and sugar. Recommendations include strategies to plan ahead and to communicate with the manager, chef, or server to help order a healthier meal.  Planning Your Eating Strategy  Clinical staff conducted group or individual video education with verbal and written material and guidebook.  Patient learns about the Pritikin Eating Plan and its benefit of reducing the risk of disease. The Pritikin Eating Plan does not focus on calories. Instead, it emphasizes high-quality, nutrient-rich foods. By knowing the characteristics of the foods, we choose, we can determine their calorie density and make informed decisions.  Targeting Your Nutrition Priorities  Clinical staff conducted group or individual video education with verbal and written material and guidebook.  Patient learns that lifestyle habits have a tremendous impact on disease risk and progression. This video provides eating and physical activity recommendations based on your personal health goals, such as reducing LDL cholesterol, losing weight, preventing or controlling type 2  diabetes, and reducing high blood pressure.  Vitamins and Minerals  Clinical staff conducted group or individual video education with verbal and written material and guidebook.  Patient learns different ways to obtain key vitamins and minerals, including through a recommended healthy diet. It is important to discuss all supplements you take with your doctor.   Healthy Mind-Set    Smoking Cessation  Clinical staff conducted group or individual video education with verbal and written material and guidebook.  Patient learns that cigarette smoking and tobacco addiction pose a serious health risk which affects millions of people. Stopping smoking will significantly reduce the risk of heart disease, lung disease, and many forms of cancer. Recommended strategies for quitting are covered, including working with your doctor to develop a successful plan.  Culinary   Becoming a Set designer conducted group or individual video education with verbal and written material and guidebook.  Patient learns that cooking at home can be healthy, cost-effective, quick, and puts them in control. Keys to cooking healthy recipes will include looking at your recipe, assessing your equipment needs, planning ahead, making it simple, choosing cost-effective seasonal ingredients, and limiting the use of added fats, salts, and sugars.  Cooking - Breakfast and Snacks  Clinical staff conducted group or individual video education with verbal and written material and guidebook.  Patient learns how important breakfast is to satiety and nutrition through the entire day. Recommendations include key foods to eat during breakfast to help stabilize blood sugar levels and to prevent overeating at meals later in the day. Planning ahead is also a key component.  Cooking - Educational psychologist conducted group or individual video education with verbal and written material and guidebook.  Patient learns eating  strategies to improve overall health, including an approach to cook more at home. Recommendations include thinking of animal protein as a side on your plate rather than center stage and focusing instead on lower calorie dense options like vegetables, fruits, whole grains, and plant-based proteins, such as beans. Making sauces in large quantities to freeze for later and leaving the skin on your vegetables are also recommended to maximize your experience.  Cooking - Healthy Salads and Dressing Clinical staff conducted group or individual video education with verbal and written material and guidebook.  Patient learns that vegetables, fruits, whole grains, and legumes are the foundations of the Pritikin Eating Plan. Recommendations include how to incorporate each of these in flavorful and healthy salads, and how to create homemade salad dressings. Proper handling of  ingredients is also covered. Cooking - Soups and [employer] - Soups and Desserts Clinical staff conducted group or individual video education with verbal and written material and guidebook.  Patient learns that Pritikin soups and desserts make for easy, nutritious, and delicious snacks and meal components that are low in sodium, fat, sugar, and calorie density, while high in vitamins, minerals, and filling fiber. Recommendations include simple and healthy ideas for soups and desserts.   Overview     The Pritikin Solution Program Overview Clinical staff conducted group or individual video education with verbal and written material and guidebook.  Patient learns that the results of the Pritikin Program have been documented in more than 100 articles published in peer-reviewed journals, and the benefits include reducing risk factors for (and, in some cases, even reversing) high cholesterol, high blood pressure, type 2 diabetes, obesity, and more! An overview of the three key pillars of the Pritikin Program will be covered: eating well, doing  regular exercise, and having a healthy mind-set.  WORKSHOPS  Exercise: Exercise Basics: Building Your Action Plan Clinical staff led group instruction and group discussion with PowerPoint presentation and patient guidebook. To enhance the learning environment the use of posters, models and videos may be added. At the conclusion of this workshop, patients will comprehend the difference between physical activity and exercise, as well as the benefits of incorporating both, into their routine. Patients will understand the FITT (Frequency, Intensity, Time, and Type) principle and how to use it to build an exercise action plan. In addition, safety concerns and other considerations for exercise and cardiac rehab will be addressed by the presenter. The purpose of this lesson is to promote a comprehensive and effective weekly exercise routine in order to improve patients' overall level of fitness.   Managing Heart Disease: Your Path to a Healthier Heart Clinical staff led group instruction and group discussion with PowerPoint presentation and patient guidebook. To enhance the learning environment the use of posters, models and videos may be added.At the conclusion of this workshop, patients will understand the anatomy and physiology of the heart. Additionally, they will understand how Pritikin's three pillars impact the risk factors, the progression, and the management of heart disease.  The purpose of this lesson is to provide a high-level overview of the heart, heart disease, and how the Pritikin lifestyle positively impacts risk factors.  Exercise Biomechanics Clinical staff led group instruction and group discussion with PowerPoint presentation and patient guidebook. To enhance the learning environment the use of posters, models and videos may be added. Patients will learn how the structural parts of their bodies function and how these functions impact their daily activities, movement, and exercise.  Patients will learn how to promote a neutral spine, learn how to manage pain, and identify ways to improve their physical movement in order to promote healthy living. The purpose of this lesson is to expose patients to common physical limitations that impact physical activity. Participants will learn practical ways to adapt and manage aches and pains, and to minimize their effect on regular exercise. Patients will learn how to maintain good posture while sitting, walking, and lifting.  Balance Training and Fall Prevention  Clinical staff led group instruction and group discussion with PowerPoint presentation and patient guidebook. To enhance the learning environment the use of posters, models and videos may be added. At the conclusion of this workshop, patients will understand the importance of their sensorimotor skills (vision, proprioception, and the vestibular system) in maintaining their ability  to balance as they age. Patients will apply a variety of balancing exercises that are appropriate for their current level of function. Patients will understand the common causes for poor balance, possible solutions to these problems, and ways to modify their physical environment in order to minimize their fall risk. The purpose of this lesson is to teach patients about the importance of maintaining balance as they age and ways to minimize their risk of falling.  WORKSHOPS   Nutrition:  Fueling a Ship broker led group instruction and group discussion with PowerPoint presentation and patient guidebook. To enhance the learning environment the use of posters, models and videos may be added. Patients will review the foundational principles of the Pritikin Eating Plan and understand what constitutes a serving size in each of the food groups. Patients will also learn Pritikin-friendly foods that are better choices when away from home and review make-ahead meal and snack options. Calorie  density will be reviewed and applied to three nutrition priorities: weight maintenance, weight loss, and weight gain. The purpose of this lesson is to reinforce (in a group setting) the key concepts around what patients are recommended to eat and how to apply these guidelines when away from home by planning and selecting Pritikin-friendly options. Patients will understand how calorie density may be adjusted for different weight management goals.  Mindful Eating  Clinical staff led group instruction and group discussion with PowerPoint presentation and patient guidebook. To enhance the learning environment the use of posters, models and videos may be added. Patients will briefly review the concepts of the Pritikin Eating Plan and the importance of low-calorie dense foods. The concept of mindful eating will be introduced as well as the importance of paying attention to internal hunger signals. Triggers for non-hunger eating and techniques for dealing with triggers will be explored. The purpose of this lesson is to provide patients with the opportunity to review the basic principles of the Pritikin Eating Plan, discuss the value of eating mindfully and how to measure internal cues of hunger and fullness using the Hunger Scale. Patients will also discuss reasons for non-hunger eating and learn strategies to use for controlling emotional eating.  Targeting Your Nutrition Priorities Clinical staff led group instruction and group discussion with PowerPoint presentation and patient guidebook. To enhance the learning environment the use of posters, models and videos may be added. Patients will learn how to determine their genetic susceptibility to disease by reviewing their family history. Patients will gain insight into the importance of diet as part of an overall healthy lifestyle in mitigating the impact of genetics and other environmental insults. The purpose of this lesson is to provide patients with the  opportunity to assess their personal nutrition priorities by looking at their family history, their own health history and current risk factors. Patients will also be able to discuss ways of prioritizing and modifying the Pritikin Eating Plan for their highest risk areas  Menu  Clinical staff led group instruction and group discussion with PowerPoint presentation and patient guidebook. To enhance the learning environment the use of posters, models and videos may be added. Using menus brought in from [employer], or printed from [employer], patients will apply the Pritikin dining out guidelines that were presented in the [employer] video. Patients will also be able to practice these guidelines in a variety of provided scenarios. The purpose of this lesson is to provide patients with the opportunity to practice hands-on learning of the Pritikin  Dining Out guidelines with actual menus and practice scenarios.  Label Reading Clinical staff led group instruction and group discussion with PowerPoint presentation and patient guidebook. To enhance the learning environment the use of posters, models and videos may be added. Patients will review and discuss the Pritikin label reading guidelines presented in [employer]'s Label Reading Educational series video. Using fool labels brought in from local grocery stores and markets, patients will apply the label reading guidelines and determine if the packaged food meet the Pritikin guidelines. The purpose of this lesson is to provide patients with the opportunity to review, discuss, and practice hands-on learning of the Pritikin Label Reading guidelines with actual packaged food labels. Cooking School  Pritikin's [employer] are designed to teach patients ways to prepare quick, simple, and affordable recipes at home. The importance of nutrition's role in chronic disease risk reduction is reflected in its emphasis in the overall  Pritikin program. By learning how to prepare essential core Pritikin Eating Plan recipes, patients will increase control over what they eat; be able to customize the flavor of foods without the use of added salt, sugar, or fat; and improve the quality of the food they consume. By learning a set of core recipes which are easily assembled, quickly prepared, and affordable, patients are more likely to prepare more healthy foods at home. These workshops focus on convenient breakfasts, simple entres, side dishes, and desserts which can be prepared with minimal effort and are consistent with nutrition recommendations for cardiovascular risk reduction. Cooking [employer] are taught by a Armed forces logistics/support/administrative officer (RD) who has been trained by the [employer]. The chef or RD has a clear understanding of the importance of minimizing - if not completely eliminating - added fat, sugar, and sodium in recipes. Throughout the series of Cooking School Workshop sessions, patients will learn about healthy ingredients and efficient methods of cooking to build confidence in their capability to prepare    Cooking School weekly topics:  Adding Flavor- Sodium-Free  Fast and Healthy Breakfasts  Powerhouse Plant-Based Proteins  Satisfying Salads and Dressings  Simple Sides and Sauces  International Cuisine-Spotlight on the [employer] Zones  Delicious Desserts  Savory Soups  [employer] - Meals in a Astronomer Appetizers and Snacks  Comforting Weekend Breakfasts  One-Pot Wonders   Fast Evening Meals  Landscape architect Your Pritikin Plate  WORKSHOPS   Healthy Mindset (Psychosocial):  Focused Goals, Sustainable Changes Clinical staff led group instruction and group discussion with PowerPoint presentation and patient guidebook. To enhance the learning environment the use of posters, models and videos may be added. Patients will be able to apply effective goal setting strategies  to establish at least one personal goal, and then take consistent, meaningful action toward that goal. They will learn to identify common barriers to achieving personal goals and develop strategies to overcome them. Patients will also gain an understanding of how our mind-set can impact our ability to achieve goals and the importance of cultivating a positive and growth-oriented mind-set. The purpose of this lesson is to provide patients with a deeper understanding of how to set and achieve personal goals, as well as the tools and strategies needed to overcome common obstacles which may arise along the way.  From Head to Heart: The Power of a Healthy Outlook  Clinical staff led group instruction and group discussion with PowerPoint presentation and patient guidebook. To enhance the learning environment the use of posters, models and videos  may be added. Patients will be able to recognize and describe the impact of emotions and mood on physical health. They will discover the importance of self-care and explore self-care practices which may work for them. Patients will also learn how to utilize the 4 C's to cultivate a healthier outlook and better manage stress and challenges. The purpose of this lesson is to demonstrate to patients how a healthy outlook is an essential part of maintaining good health, especially as they continue their cardiac rehab journey.  Healthy Sleep for a Healthy Heart Clinical staff led group instruction and group discussion with PowerPoint presentation and patient guidebook. To enhance the learning environment the use of posters, models and videos may be added. At the conclusion of this workshop, patients will be able to demonstrate knowledge of the importance of sleep to overall health, well-being, and quality of life. They will understand the symptoms of, and treatments for, common sleep disorders. Patients will also be able to identify daytime and nighttime behaviors which impact  sleep, and they will be able to apply these tools to help manage sleep-related challenges. The purpose of this lesson is to provide patients with a general overview of sleep and outline the importance of quality sleep. Patients will learn about a few of the most common sleep disorders. Patients will also be introduced to the concept of "sleep hygiene," and discover ways to self-manage certain sleeping problems through simple daily behavior changes. Finally, the workshop will motivate patients by clarifying the links between quality sleep and their goals of heart-healthy living.   Recognizing and Reducing Stress Clinical staff led group instruction and group discussion with PowerPoint presentation and patient guidebook. To enhance the learning environment the use of posters, models and videos may be added. At the conclusion of this workshop, patients will be able to understand the types of stress reactions, differentiate between acute and chronic stress, and recognize the impact that chronic stress has on their health. They will also be able to apply different coping mechanisms, such as reframing negative self-talk. Patients will have the opportunity to practice a variety of stress management techniques, such as deep abdominal breathing, progressive muscle relaxation, and/or guided imagery.  The purpose of this lesson is to educate patients on the role of stress in their lives and to provide healthy techniques for coping with it.  Learning Barriers/Preferences:  Learning Barriers/Preferences - 10/10/22 1142       Learning Barriers/Preferences   Learning Barriers Sight   wears glasses   Learning Preferences Computer/Internet;Audio;Group Instruction;Individual Instruction;Pictoral;Skilled Demonstration;Verbal Instruction;Video;Written Material             Education Topics:  Knowledge Questionnaire Score:  Knowledge Questionnaire Score - 10/10/22 1346       Knowledge Questionnaire Score   Pre  Score 19/24             Core Components/Risk Factors/Patient Goals at Admission:  Personal Goals and Risk Factors at Admission - 10/10/22 1344       Core Components/Risk Factors/Patient Goals on Admission   Hypertension Yes    Intervention Provide education on lifestyle modifcations including regular physical activity/exercise, weight management, moderate sodium restriction and increased consumption of fresh fruit, vegetables, and low fat dairy, alcohol moderation, and smoking cessation.;Monitor prescription use compliance.    Expected Outcomes Short Term: Continued assessment and intervention until BP is < 140/16mm HG in hypertensive participants. < 130/29mm HG in hypertensive participants with diabetes, heart failure or chronic kidney disease.;Long Term: Maintenance of blood pressure at  goal levels.    Lipids Yes    Intervention Provide education and support for participant on nutrition & aerobic/resistive exercise along with prescribed medications to achieve LDL 70mg , HDL >40mg .    Expected Outcomes Short Term: Participant states understanding of desired cholesterol values and is compliant with medications prescribed. Participant is following exercise prescription and nutrition guidelines.;Long Term: Cholesterol controlled with medications as prescribed, with individualized exercise RX and with personalized nutrition plan. Value goals: LDL < 70mg , HDL > 40 mg.             Core Components/Risk Factors/Patient Goals Review:   Goals and Risk Factor Review     Row Name 10/14/22 1437 10/22/22 1741 11/19/22 1541         Core Components/Risk Factors/Patient Goals Review   Personal Goals Review Hypertension;Lipids Hypertension;Lipids Hypertension;Lipids     Review Marisa Fuller started intensive cardiac rehab on 10/14/22 and did well with exercise Marisa Fuller started intensive cardiac rehab on 10/14/22 and is off to a good start to exercise. Vital signs have been stable Marisa Fuller is doing well with  exercise at  intensive cardiac rehab.  Vital signs have been stable     Expected Outcomes Marisa Fuller will continue to participate in intensive cardiac rehab for exercise, nutrition and lifestyle modifications Marisa Fuller will continue to participate in intensive cardiac rehab for exercise, nutrition and lifestyle modifications Marisa Fuller will continue to participate in intensive cardiac rehab for exercise, nutrition and lifestyle modifications              Core Components/Risk Factors/Patient Goals at Discharge (Final Review):   Goals and Risk Factor Review - 11/19/22 1541       Core Components/Risk Factors/Patient Goals Review   Personal Goals Review Hypertension;Lipids    Review Marisa Fuller is doing well with exercise at  intensive cardiac rehab.  Vital signs have been stable    Expected Outcomes Marisa Fuller will continue to participate in intensive cardiac rehab for exercise, nutrition and lifestyle modifications             ITP Comments:  ITP Comments     Row Name 10/10/22 1138 10/14/22 1428 10/22/22 1740 11/19/22 1539     ITP Comments Marisa Magic, Marisa Fuller: Medical Director.  Introduction to the [facility] / Intensive Cardiac Rehab.  Initial orientation packet reviewed with the patient. 30 day ITP Review. Annalyssa started intensive cardiac rehab on  10/13/21 and did well with exercise. 30 day ITP Review. Kortne started intensive cardiac rehab on  10/13/21 and is off to a good start to exercise 30 day ITP Review. Rane has good attendance and participation in  intensive cardiac rehab             Comments: See ITP comments.

## 2022-11-20 ENCOUNTER — Encounter (HOSPITAL_COMMUNITY)
Admission: RE | Admit: 2022-11-20 | Discharge: 2022-11-20 | Disposition: A | Discharge status: Home / Self Care | Payer: Medicaid Other | Source: Ambulatory Visit | Attending: Cardiology | Admitting: Cardiology

## 2022-11-20 DIAGNOSIS — I2129 ST elevation (STEMI) myocardial infarction involving other sites: Secondary | ICD-10-CM

## 2022-11-20 DIAGNOSIS — Z955 Presence of coronary angioplasty implant and graft: Secondary | ICD-10-CM

## 2022-11-22 ENCOUNTER — Encounter (HOSPITAL_COMMUNITY)
Admission: RE | Admit: 2022-11-22 | Discharge: 2022-11-22 | Disposition: A | Discharge status: Home / Self Care | Payer: Medicaid Other | Source: Ambulatory Visit | Attending: Cardiology | Admitting: Cardiology

## 2022-11-22 DIAGNOSIS — Z955 Presence of coronary angioplasty implant and graft: Secondary | ICD-10-CM

## 2022-11-22 DIAGNOSIS — I2129 ST elevation (STEMI) myocardial infarction involving other sites: Secondary | ICD-10-CM

## 2022-11-25 ENCOUNTER — Encounter (HOSPITAL_COMMUNITY)
Admission: RE | Admit: 2022-11-25 | Discharge: 2022-11-25 | Disposition: A | Discharge status: Home / Self Care | Payer: Medicaid Other | Source: Ambulatory Visit | Attending: Cardiology | Admitting: Cardiology

## 2022-11-25 DIAGNOSIS — I2129 ST elevation (STEMI) myocardial infarction involving other sites: Secondary | ICD-10-CM

## 2022-11-25 DIAGNOSIS — Z955 Presence of coronary angioplasty implant and graft: Secondary | ICD-10-CM | POA: Diagnosis not present

## 2022-11-27 ENCOUNTER — Encounter (HOSPITAL_COMMUNITY)
Admission: RE | Admit: 2022-11-27 | Discharge: 2022-11-27 | Disposition: A | Discharge status: Home / Self Care | Payer: Medicaid Other | Source: Ambulatory Visit | Attending: Cardiology | Admitting: Cardiology

## 2022-11-27 DIAGNOSIS — Z955 Presence of coronary angioplasty implant and graft: Secondary | ICD-10-CM

## 2022-11-27 DIAGNOSIS — I2129 ST elevation (STEMI) myocardial infarction involving other sites: Secondary | ICD-10-CM

## 2022-11-29 ENCOUNTER — Encounter (HOSPITAL_COMMUNITY)
Admission: RE | Admit: 2022-11-29 | Discharge: 2022-11-29 | Disposition: A | Discharge status: Home / Self Care | Payer: Medicaid Other | Source: Ambulatory Visit | Attending: Cardiology | Admitting: Cardiology

## 2022-11-29 ENCOUNTER — Other Ambulatory Visit (HOSPITAL_COMMUNITY): Payer: Self-pay

## 2022-11-29 DIAGNOSIS — Z955 Presence of coronary angioplasty implant and graft: Secondary | ICD-10-CM | POA: Diagnosis not present

## 2022-11-29 DIAGNOSIS — I2129 ST elevation (STEMI) myocardial infarction involving other sites: Secondary | ICD-10-CM

## 2022-11-29 NOTE — Progress Notes (Signed)
Reviewed home exercise Rx with patient today.  Encouraged warm-up, cool-down, and stretching. Reviewed THRR of 63-126 and keeping RPE between 11-13. Encouraged to hydrate with activity.  Reviewed weather parameters for temperature and humidity for safe exercise outdoors. Reviewed S/S to terminate exercise and when to call 911 vs MD. Reviewed the use of NTG and pt was encouraged to carry at all times. Pt encouraged to always carry a cell phone for safety when exercising outdoors. Pt verbalized understanding of the home exercise Rx and was provided a copy.

## 2022-12-02 ENCOUNTER — Encounter (HOSPITAL_COMMUNITY)
Admission: RE | Admit: 2022-12-02 | Discharge: 2022-12-02 | Disposition: A | Discharge status: Home / Self Care | Payer: Medicaid Other | Source: Ambulatory Visit | Attending: Cardiology | Admitting: Cardiology

## 2022-12-02 DIAGNOSIS — Z955 Presence of coronary angioplasty implant and graft: Secondary | ICD-10-CM

## 2022-12-02 DIAGNOSIS — I2129 ST elevation (STEMI) myocardial infarction involving other sites: Secondary | ICD-10-CM

## 2022-12-02 NOTE — Progress Notes (Signed)
Marisa Fuller reported level 2 dyspnea on the elliptical after working out for 4 minutes. Vital signs and oxygen saturation within normal limits. Marisa Fuller wants to try using the elliptical her at cardiac rehab. Marisa Fuller has an elliptical at home she does not use it but would like to.Thayer Headings RN BSN

## 2022-12-04 ENCOUNTER — Encounter (HOSPITAL_COMMUNITY)
Admission: RE | Admit: 2022-12-04 | Discharge: 2022-12-04 | Disposition: A | Discharge status: Home / Self Care | Payer: Medicaid Other | Source: Ambulatory Visit | Attending: Cardiology | Admitting: Cardiology

## 2022-12-04 DIAGNOSIS — Z955 Presence of coronary angioplasty implant and graft: Secondary | ICD-10-CM | POA: Diagnosis not present

## 2022-12-04 DIAGNOSIS — I2129 ST elevation (STEMI) myocardial infarction involving other sites: Secondary | ICD-10-CM

## 2022-12-06 ENCOUNTER — Encounter (HOSPITAL_COMMUNITY)
Admission: RE | Admit: 2022-12-06 | Discharge: 2022-12-06 | Disposition: A | Discharge status: Home / Self Care | Payer: Medicaid Other | Source: Ambulatory Visit | Attending: Cardiology | Admitting: Cardiology

## 2022-12-06 DIAGNOSIS — I2129 ST elevation (STEMI) myocardial infarction involving other sites: Secondary | ICD-10-CM

## 2022-12-06 DIAGNOSIS — Z955 Presence of coronary angioplasty implant and graft: Secondary | ICD-10-CM | POA: Diagnosis not present

## 2022-12-09 ENCOUNTER — Encounter (HOSPITAL_COMMUNITY)
Admission: RE | Admit: 2022-12-09 | Discharge: 2022-12-09 | Disposition: A | Discharge status: Home / Self Care | Payer: Medicaid Other | Source: Ambulatory Visit | Attending: Cardiology | Admitting: Cardiology

## 2022-12-09 DIAGNOSIS — Z955 Presence of coronary angioplasty implant and graft: Secondary | ICD-10-CM

## 2022-12-09 DIAGNOSIS — I2129 ST elevation (STEMI) myocardial infarction involving other sites: Secondary | ICD-10-CM

## 2022-12-11 ENCOUNTER — Encounter (HOSPITAL_COMMUNITY): Payer: Medicaid Other

## 2022-12-13 ENCOUNTER — Encounter (HOSPITAL_COMMUNITY): Payer: Medicaid Other

## 2022-12-16 ENCOUNTER — Encounter (HOSPITAL_COMMUNITY)
Admission: RE | Admit: 2022-12-16 | Discharge: 2022-12-16 | Disposition: A | Discharge status: Home / Self Care | Payer: Medicaid Other | Source: Ambulatory Visit | Attending: Cardiology | Admitting: Cardiology

## 2022-12-16 VITALS — Ht 66.75 in | Wt 152.3 lb

## 2022-12-16 DIAGNOSIS — I2129 ST elevation (STEMI) myocardial infarction involving other sites: Secondary | ICD-10-CM | POA: Insufficient documentation

## 2022-12-16 DIAGNOSIS — Z955 Presence of coronary angioplasty implant and graft: Secondary | ICD-10-CM | POA: Diagnosis present

## 2022-12-17 ENCOUNTER — Other Ambulatory Visit (HOSPITAL_COMMUNITY): Payer: Self-pay

## 2022-12-17 ENCOUNTER — Other Ambulatory Visit: Payer: Self-pay

## 2022-12-17 MED ORDER — REPATHA SURECLICK 140 MG/ML ~~LOC~~ SOAJ
140.0000 mg | SUBCUTANEOUS | 3 refills | Status: DC
  Filled 2022-12-17 – 2022-12-20 (×2): qty 12, 168d supply, fill #0
  Filled 2023-05-26: qty 12, 168d supply, fill #1
  Filled 2023-10-31: qty 12, 168d supply, fill #2

## 2022-12-18 ENCOUNTER — Encounter (HOSPITAL_COMMUNITY)
Admission: RE | Admit: 2022-12-18 | Discharge: 2022-12-18 | Disposition: A | Discharge status: Home / Self Care | Payer: Medicaid Other | Source: Ambulatory Visit | Attending: Cardiology | Admitting: Cardiology

## 2022-12-18 DIAGNOSIS — Z955 Presence of coronary angioplasty implant and graft: Secondary | ICD-10-CM

## 2022-12-18 DIAGNOSIS — I2129 ST elevation (STEMI) myocardial infarction involving other sites: Secondary | ICD-10-CM

## 2022-12-18 NOTE — Progress Notes (Signed)
Discharge Progress Report  Patient Details  Name: Marisa Fuller MRN: [id number] Date of Birth: 10/03/1960 Referring Provider:   Flowsheet Row CARDIAC REHAB PHASE II ORIENTATION from 10/10/2022 in [facility]  Referring Provider Jodelle Red, MD        Number of Visits: 77  Reason for Discharge:  Patient reached a stable level of exercise. Patient independent in their exercise. Patient has met program and personal goals.  Smoking History:  Social History   Tobacco Use  Smoking Status Former  Smokeless Tobacco Never    Diagnosis:  07/18/22 OM Status post coronary artery stent placement  07/18/22 ST elevation myocardial infarction (STEMI)  ADL [facility]:   Initial Exercise Prescription:  Initial Exercise Prescription - 10/10/22 1400       Date of Initial Exercise RX and Referring Provider   Date 10/10/22    Referring Provider bridgette Cristal Deer, MD    Expected Discharge Date 12/20/22      Bike   Level 1    Watts 25    Minutes 15    METs 3.2      NuStep   Level 1    SPM 75    Minutes 15    METs 3.2      Prescription Details   Frequency (times per week) 3    Duration Progress to 30 minutes of continuous aerobic without signs/symptoms of physical distress      Intensity   THRR 40-80% of Max Heartrate 62-123    Ratings of Perceived Exertion 11-13    Perceived Dyspnea 0-4      Progression   Progression Continue progressive overload as per policy without signs/symptoms or physical distress.      Resistance Training   Training Prescription Yes    Weight 3 lbs    Reps 10-15             Discharge Exercise Prescription (Final Exercise Prescription Changes):  Exercise Prescription Changes - 12/20/22 1614       Response to Exercise   Blood Pressure (Admit) 110/58    Blood Pressure (Exercise) 122/72    Blood Pressure (Exit) 106/70    Heart Rate (Admit) 57 bpm    Heart Rate (Exercise)  80 bpm    Heart Rate (Exit) 63 bpm    Rating of Perceived Exertion (Exercise) 12    Symptoms None    Comments Pt graduated form the CRP2 program    Duration Continue with 30 min of aerobic exercise without signs/symptoms of physical distress.    Intensity THRR unchanged      Progression   Progression Continue to progress workloads to maintain intensity without signs/symptoms of physical distress.    Average METs 5.1      Resistance Training   Training Prescription Yes    Weight 3 lbs    Reps 10-15    Time 10 Minutes      Interval Training   Interval Training No      Recumbant Elliptical   Level 1    RPM 49    Watts 94    Minutes 30    METs 5.1      Home Exercise Plan   Plans to continue exercise at Home (comment)    Frequency Add 3 additional days to program exercise sessions.    Initial Home Exercises Provided 11/29/22             Functional Capacity:  6 Minute Walk  Row Name 10/10/22 1212 12/16/22 1359       6 Minute Walk   Phase Initial Discharge    Distance 1184 feet 1534 feet    Distance % Change -- 29.56 %    Distance Feet Change -- 350 ft    Walk Time 6 minutes 6 minutes    # of Rest Breaks 0 0    MPH 2.24 2.9    METS 3.21 3.88    RPE 8 12    Perceived Dyspnea  1 1    VO2 Peak 11.22 13.57    Symptoms Yes (comment) Yes (comment)    Comments SOB, RPD = 1, Fatigue Fatigue and SOB    Resting HR 56 bpm 67 bpm    Resting BP 126/70 126/64    Resting Oxygen Saturation  100 % --    Exercise Oxygen Saturation  during 6 min walk 100 % --    Max Ex. HR 84 bpm 87 bpm    Max Ex. BP 132/78 138/58    2 Minute Post BP 120/70 --             Psychological, QOL, Others - Outcomes: PHQ 2/9:    12/18/2022    2:02 PM 10/10/2022   11:41 AM 07/30/2022   11:02 AM  Depression screen PHQ 2/9  Decreased Interest 0 0 0  Down, Depressed, Hopeless 0 0 0  PHQ - 2 Score 0 0 0  Altered sleeping 0 0 0  Tired, decreased energy 0 3 3  Change in appetite 0 0 0   Feeling bad or failure about yourself  0 0 0  Trouble concentrating 0 0 0  Moving slowly or fidgety/restless 0 0 0  Suicidal thoughts 0 0 0  PHQ-9 Score 0 3 3  Difficult doing work/chores Not difficult at all Somewhat difficult Not difficult at all    Quality of Life:  Quality of Life - 12/20/22 1630       Quality of Life Scores   Health/Function Pre 27.2 %    Health/Function Post 28 %    Health/Function % Change 2.94 %    Socioeconomic Pre 30 %    Socioeconomic Post 30 %    Socioeconomic % Change  0 %    Psych/Spiritual Pre 30 %    Psych/Spiritual Post 30 %    Psych/Spiritual % Change 0 %    Family Pre 30 %    Family Post 30 %    Family % Change 0 %    GLOBAL Pre 28.8 %    GLOBAL Post 29.14 %    GLOBAL % Change 1.18 %             Personal Goals: Goals established at orientation with interventions provided to work toward goal.  Personal Goals and Risk Factors at Admission - 10/10/22 1344       Core Components/Risk Factors/Patient Goals on Admission   Hypertension Yes    Intervention Provide education on lifestyle modifcations including regular physical activity/exercise, weight management, moderate sodium restriction and increased consumption of fresh fruit, vegetables, and low fat dairy, alcohol moderation, and smoking cessation.;Monitor prescription use compliance.    Expected Outcomes Short Term: Continued assessment and intervention until BP is < 140/69mm HG in hypertensive participants. < 130/62mm HG in hypertensive participants with diabetes, heart failure or chronic kidney disease.;Long Term: Maintenance of blood pressure at goal levels.    Lipids Yes    Intervention Provide education and support for participant on  nutrition & aerobic/resistive exercise along with prescribed medications to achieve LDL 70mg , HDL >40mg .    Expected Outcomes Short Term: Participant states understanding of desired cholesterol values and is compliant with medications prescribed.  Participant is following exercise prescription and nutrition guidelines.;Long Term: Cholesterol controlled with medications as prescribed, with individualized exercise RX and with personalized nutrition plan. Value goals: LDL < 70mg , HDL > 40 mg.              Personal Goals Discharge:  Goals and Risk Factor Review     Row Name 10/14/22 1437 10/22/22 1741 11/19/22 1541 12/18/22 0954       Core Components/Risk Factors/Patient Goals Review   Personal Goals Review Hypertension;Lipids Hypertension;Lipids Hypertension;Lipids Hypertension;Lipids    Review Carma started intensive cardiac rehab on 10/14/22 and did well with exercise Maraki started intensive cardiac rehab on 10/14/22 and is off to a good start to exercise. Vital signs have been stable Joell is doing well with exercise at  intensive cardiac rehab.  Vital signs have been stable Damien is doing well with exercise at  intensive cardiac rehab.  Vital signs have been stable. Siah will complete cardiac rehab on 12/20/22    Expected Outcomes Brei will continue to participate in intensive cardiac rehab for exercise, nutrition and lifestyle modifications Amiracle will continue to participate in intensive cardiac rehab for exercise, nutrition and lifestyle modifications Artemis will continue to participate in intensive cardiac rehab for exercise, nutrition and lifestyle modifications Vandella will continue to participate in intensive cardiac rehab for exercise, nutrition and lifestyle modifications             Exercise Goals and Review:  Exercise Goals     Row Name 10/10/22 1404             Exercise Goals   Increase Physical Activity Yes       Intervention Provide advice, education, support and counseling about physical activity/exercise needs.;Develop an individualized exercise prescription for aerobic and resistive training based on initial evaluation findings, risk stratification, comorbidities and participant's personal goals.        Expected Outcomes Short Term: Attend rehab on a regular basis to increase amount of physical activity.;Long Term: Add in home exercise to make exercise part of routine and to increase amount of physical activity.;Long Term: Exercising regularly at least 3-5 days a week.       Increase Strength and Stamina Yes       Intervention Provide advice, education, support and counseling about physical activity/exercise needs.;Develop an individualized exercise prescription for aerobic and resistive training based on initial evaluation findings, risk stratification, comorbidities and participant's personal goals.       Expected Outcomes Short Term: Increase workloads from initial exercise prescription for resistance, speed, and METs.;Short Term: Perform resistance training exercises routinely during rehab and add in resistance training at home;Long Term: Improve cardiorespiratory fitness, muscular endurance and strength as measured by increased METs and functional capacity ( )       Able to understand and use rate of perceived exertion (RPE) scale Yes       Intervention Provide education and explanation on how to use RPE scale       Expected Outcomes Short Term: Able to use RPE daily in rehab to express subjective intensity level;Long Term:  Able to use RPE to guide intensity level when exercising independently       Knowledge and understanding of Target Heart Rate Range (THRR) Yes       Intervention Provide education and  explanation of THRR including how the numbers were predicted and where they are located for reference       Expected Outcomes Short Term: Able to state/look up THRR;Long Term: Able to use THRR to govern intensity when exercising independently;Short Term: Able to use daily as guideline for intensity in rehab       Understanding of Exercise Prescription Yes       Intervention Provide education, explanation, and written materials on patient's individual exercise prescription       Expected  Outcomes Short Term: Able to explain program exercise prescription;Long Term: Able to explain home exercise prescription to exercise independently                Exercise Goals Re-Evaluation:  Exercise Goals Re-Evaluation     Row Name 10/14/22 1409 11/13/22 1500 12/16/22 1435         Exercise Goal Re-Evaluation   Exercise Goals Review Increase Physical Activity;Increase Strength and Stamina;Able to understand and use rate of perceived exertion (RPE) scale;Knowledge and understanding of Target Heart Rate Range (THRR);Understanding of Exercise Prescription Increase Physical Activity;Increase Strength and Stamina;Able to understand and use rate of perceived exertion (RPE) scale;Knowledge and understanding of Target Heart Rate Range (THRR);Understanding of Exercise Prescription Increase Physical Activity;Increase Strength and Stamina;Able to understand and use rate of perceived exertion (RPE) scale;Knowledge and understanding of Target Heart Rate Range (THRR);Understanding of Exercise Prescription     Comments Pt's first day in the cRP2 program. Pt understands the exercise Rx, RPE scale and THRR, Reviewed METs and goals. Pt's short term goals are increased strength and stamina and to be able to walk a mile. Pt voices that she has achieved these goals. Pt is feeling stronger and walking the mile without difficulty. Reviewed goals. Peak METs 5.1. Strength and stamina have improved per patient.     Expected Outcomes Will continue to montior patient and progress exercise workloads as tolerated. Will continue to montior patient and progress exercise workloads as tolerated. Will continue to montior patient and progress exercise workloads as tolerated.              Nutrition & Weight - Outcomes:  Pre Biometrics - 10/10/22 1124       Pre Biometrics   Waist Circumference 35 inches    Hip Circumference 38 inches    Waist to Hip Ratio 0.92 %    Triceps Skinfold 16 mm    % Body Fat 33.2 %    Grip  Strength 41 kg    Flexibility 13 in    Single Leg Stand 30 seconds             Post Biometrics - 12/16/22 1406        Post  Biometrics   Height 5' 6.75" (1.695 m)    Weight 69.1 kg    Waist Circumference 34 inches    Hip Circumference 37.5 inches    Waist to Hip Ratio 0.91 %    BMI (Calculated) 24.05    Triceps Skinfold 17 mm    % Body Fat 33.2 %    Grip Strength 45 kg    Flexibility 12.25 in    Single Leg Stand 30 seconds             Nutrition:  Nutrition Therapy & Goals - 12/12/22 1447       Nutrition Therapy   Diet Heart healthy diet    Drug/Food Interactions Statins/Certain Fruits      Personal Nutrition Goals   Nutrition  Goal Patient to identify strategies for reducing cardiovascular risk by attending the [employer] education and nutrition series weekly.    Personal Goal #2 Patient to improve diet quality by using the plate method as a guide for meal planning to include lean protein/plant protein, fruits, vegetables, whole grains, nonfat dairy as part of a well-balanced diet.    Personal Goal #3 Patient to reduce sodium intake to 1500mg  per day    Comments Goals in action. Mirabel continues to attend the [employer] and nutrition series regularly. She has started making many dietary changes including reduced sodium and increased dietary fiber and implemented many [employer] recipes. She is down 4.8# since starting with our program; she reports readiness to maintain weight at this time. Reviewed strategies for weight maintenance. Patient will benefit from participation in intensive cardiac rehab for nutrition, exercise, and lifestyle modification.      Intervention Plan   Intervention Prescribe, educate and counsel regarding individualized specific dietary modifications aiming towards targeted core components such as weight, hypertension, lipid management, diabetes, heart failure and other comorbidities.;Nutrition handout(s) given to patient.    Expected Outcomes  Short Term Goal: Understand basic principles of dietary content, such as calories, fat, sodium, cholesterol and nutrients.;Long Term Goal: Adherence to prescribed nutrition plan.             Nutrition Discharge:  Nutrition Assessments - 12/18/22 1120       Rate Your Plate Scores   Pre Score 68    Post Score 79             Education Questionnaire Score:  Knowledge Questionnaire Score - 12/20/22 1622       Knowledge Questionnaire Score   Post Score 24/24             Goals reviewed with patient; copy given to patient.Pt graduates from  Intensive/Traditional cardiac rehab program on 12/20/22 with completion of  25 exercise and 25  education sessions. Pt maintained good attendance and progressed nicely during their participation in rehab as evidenced by increased MET level.   Medication list reconciled. Repeat  PHQ score- 0 .  Pt has made significant lifestyle changes and should be commended for their success.  Averyanna achieved their goals during cardiac rehab.   Pt plans to continue exercise at home using her bike and elliptical. Malayja increased her distance on her post exercise walk test by 350 feet.  And lost 1.7 kg. We are proud of Merideth's progress!Thayer Headings RN BSN

## 2022-12-18 NOTE — Progress Notes (Signed)
Cardiac Individual Treatment Plan  Patient Details  Name: Marisa Fuller MRN: [id number] Date of Birth: 1960-09-18 Referring Provider:   Flowsheet Row CARDIAC REHAB PHASE II ORIENTATION from 10/10/2022 in [facility]  Referring Provider Jodelle Red, MD       Initial Encounter Date:  Flowsheet Row CARDIAC REHAB PHASE II ORIENTATION from 10/10/2022 in [facility]  Date 10/10/22       Visit Diagnosis: 07/18/22 OM Status post coronary artery stent placement  07/18/22 ST elevation myocardial infarction (STEMI)  Patient's Home Medications on Admission:  Current Outpatient Medications:    aspirin EC 81 MG tablet, Take 1 tablet (81 mg total) by mouth daily. Swallow whole., Disp: , Rfl:    atorvastatin (LIPITOR) 40 MG tablet, Take 1 tablet (40 mg total) by mouth daily., Disp: 90 tablet, Rfl: 3   cetirizine (ZYRTEC) 10 MG tablet, Take 10 mg by mouth at bedtime., Disp: , Rfl:    Evolocumab (REPATHA SURECLICK) 140 MG/ML SOAJ, Inject 140 mg into the skin every 14 (fourteen) days. For Investigational Use Only. Inject subcutaneously into abdomen, thigh, or upper arm every 14 days. Rotate injection sites and do not inject into areas where skin is tender, bruised, or red. Please contact [facility] for any questions or concerns regarding this medication., Disp: 12 mL, Rfl: 3   fluticasone (FLONASE) 50 MCG/ACT nasal spray, Place 1 spray into both nostrils daily., Disp: 18.2 mL, Rfl: 2   losartan (COZAAR) 50 MG tablet, Take 1 tablet (50 mg total) by mouth daily., Disp: 30 tablet, Rfl: 5   metoprolol succinate (TOPROL XL) 25 MG 24 hr tablet, Take 1 tablet (25 mg total) by mouth daily., Disp: 30 tablet, Rfl: 5   Multiple Vitamins-Minerals (MULTIVITAMIN WOMEN PO), Take 1 tablet by mouth daily. (Patient not taking: Reported on 10/10/2022), Disp: ,  Rfl:    nitroGLYCERIN (NITROSTAT) 0.4 MG SL tablet, Place 1 tablet (0.4 mg total) under the tongue every 5 (five) minutes as needed for chest pain., Disp: 25 tablet, Rfl: 1   ticagrelor (BRILINTA) 90 MG TABS tablet, Take 1 tablet (90 mg total) by mouth 2 (two) times daily., Disp: 60 tablet, Rfl: 11  Past Medical History: Past Medical History:  Diagnosis Date   Allergy    Anemia    sickle cell trait   CAD (coronary artery disease) 07/18/2022   DES to first obtuse marginal   Clotting disorder (HCC)    DVT (deep venous thrombosis) (HCC)    Remote DVT per notes   GERD (gastroesophageal reflux disease)    Hyperlipidemia    Hypertension    Sickle cell anemia (HCC)    trait    Tobacco Use: Social History   Tobacco Use  Smoking Status Former  Smokeless Tobacco Never    Labs: Review Flowsheet  More data exists      Latest Ref Rng & Units 08/31/2019 07/18/2022 07/19/2022 07/20/2022 09/24/2022  Labs for ITP Cardiac and Pulmonary Rehab  Cholestrol 100 - 199 mg/dL 409  - 811  914  85   LDL (calc) 0 - 99 mg/dL 782  - 956  213  16   HDL-C >39 mg/dL 53  - 52  45  53   Trlycerides 0 - 149 mg/dL 086  - 578  469  77   Hemoglobin A1c 4.8 - 5.6 % - 6.3  - - -  TCO2  22 - 32 mmol/L - 20  - - -    Capillary Blood Glucose: No results found for: "GLUCAP"   Exercise Target Goals: Exercise Program Goal: Individual exercise prescription set using results from initial 6 min walk test and THRR while considering  patient's activity barriers and safety.   Exercise Prescription Goal: Initial exercise prescription builds to 30-45 minutes a day of aerobic activity, 2-3 days per week.  Home exercise guidelines will be given to patient during program as part of exercise prescription that the participant will acknowledge.  Activity Barriers & Risk Stratification:  Activity Barriers & Cardiac Risk Stratification - 10/10/22 1405       Activity Barriers & Cardiac Risk Stratification   Activity Barriers  Deconditioning;Shortness of Breath    Cardiac Risk Stratification High             6 Minute Walk:  6 Minute Walk     Row Name 10/10/22 1212 12/16/22 1359       6 Minute Walk   Phase Initial Discharge    Distance 1184 feet 1534 feet    Distance % Change -- 29.56 %    Distance Feet Change -- 350 ft    Walk Time 6 minutes 6 minutes    # of Rest Breaks 0 0    MPH 2.24 2.9    METS 3.21 3.88    RPE 8 12    Perceived Dyspnea  1 1    VO2 Peak 11.22 13.57    Symptoms Yes (comment) Yes (comment)    Comments SOB, RPD = 1, Fatigue Fatigue and SOB    Resting HR 56 bpm 67 bpm    Resting BP 126/70 126/64    Resting Oxygen Saturation  100 % --    Exercise Oxygen Saturation  during 6 min walk 100 % --    Max Ex. HR 84 bpm 87 bpm    Max Ex. BP 132/78 138/58    2 Minute Post BP 120/70 --             Oxygen Initial Assessment:   Oxygen Re-Evaluation:   Oxygen Discharge (Final Oxygen Re-Evaluation):   Initial Exercise Prescription:  Initial Exercise Prescription - 10/10/22 1400       Date of Initial Exercise RX and Referring Provider   Date 10/10/22    Referring Provider bridgette Cristal Deer, MD    Expected Discharge Date 12/20/22      Bike   Level 1    Watts 25    Minutes 15    METs 3.2      NuStep   Level 1    SPM 75    Minutes 15    METs 3.2      Prescription Details   Frequency (times per week) 3    Duration Progress to 30 minutes of continuous aerobic without signs/symptoms of physical distress      Intensity   THRR 40-80% of Max Heartrate 62-123    Ratings of Perceived Exertion 11-13    Perceived Dyspnea 0-4      Progression   Progression Continue progressive overload as per policy without signs/symptoms or physical distress.      Resistance Training   Training Prescription Yes    Weight 3 lbs    Reps 10-15             Perform Capillary Blood Glucose checks as needed.  Exercise Prescription Changes:   Exercise Prescription Changes      Row Name  10/14/22 1400 10/30/22 1500 11/13/22 1500 11/29/22 1400 12/04/22 1400     Response to Exercise   Blood Pressure (Admit) 114/64 100/70 114/60 122/72 127/68   Blood Pressure (Exercise) 132/80 120/56 128/78 128/56 124/62   Blood Pressure (Exit) 98/72 115/73 104/56 105/56 120/58   Heart Rate (Admit) 60 bpm 65 bpm 65 bpm 85 bpm 64 bpm   Heart Rate (Exercise) 82 bpm 86 bpm 84 bpm 89 bpm 86 bpm   Heart Rate (Exit) 56 bpm 68 bpm 74 bpm 71 bpm 73 bpm   Rating of Perceived Exertion (Exercise) 12 12 12 11 12    Symptoms None None None None None   Comments Pt's first day in the CRP2 program Reviewed METs Reviewed METs and goals Reviewed home exercise Rx Reviewed METs   Duration Continue with 30 min of aerobic exercise without signs/symptoms of physical distress. Continue with 30 min of aerobic exercise without signs/symptoms of physical distress. Continue with 30 min of aerobic exercise without signs/symptoms of physical distress. Continue with 30 min of aerobic exercise without signs/symptoms of physical distress. Continue with 30 min of aerobic exercise without signs/symptoms of physical distress.   Intensity THRR unchanged THRR unchanged THRR unchanged THRR unchanged THRR unchanged     Progression   Progression Continue to follow PAD protocol Continue to progress workloads to maintain intensity without signs/symptoms of physical distress. Continue to progress workloads to maintain intensity without signs/symptoms of physical distress. Continue to progress workloads to maintain intensity without signs/symptoms of physical distress. Continue to progress workloads to maintain intensity without signs/symptoms of physical distress.   Average METs 2.2 3.45 3.05 3 4.25     Resistance Training   Training Prescription Yes No No Yes No   Weight 3 lbs No weight on Wednesdays No weight on Wednesdays 3 lbs No weight's on wednesdays   Reps 10-15 -- -- 10-15 --   Time 10 Minutes -- -- 10 Minutes --      Interval Training   Interval Training No No No No No     Bike   Level 1 -- -- -- --   Minutes 15 -- -- -- --   METs 2.3 -- -- -- --     NuStep   Level 1 2 2 2  --   SPM 86 104 101 106 --   Minutes 15 15 15 15  --   METs 2.1 2.2 2.1 2 --     Recumbant Elliptical   Level -- 1 1 1 1    RPM -- 49 50 50 64   Watts -- 74 71 60 80   Minutes -- 15 15 15 23    METs -- 4.7 4 3.8 4.5     Elliptical   Level -- -- -- -- 1   Speed -- -- -- -- 1   Minutes -- -- -- -- 7   METs -- -- -- -- 3.9     Home Exercise Plan   Plans to continue exercise at -- -- -- Home (comment) Home (comment)   Frequency -- -- -- Add 3 additional days to program exercise sessions. Add 3 additional days to program exercise sessions.   Initial Home Exercises Provided -- -- -- 11/29/22 11/29/22    Row Name 12/16/22 1433             Response to Exercise   Blood Pressure (Admit) 126/64       Blood Pressure (Exercise) 138/58       Blood Pressure (Exit) 120/70  Heart Rate (Admit) 67 bpm       Heart Rate (Exercise) 87 bpm       Heart Rate (Exit) 63 bpm       Rating of Perceived Exertion (Exercise) 12       Symptoms None       Comments Reviewed goals       Duration Continue with 30 min of aerobic exercise without signs/symptoms of physical distress.       Intensity THRR unchanged         Progression   Progression Continue to progress workloads to maintain intensity without signs/symptoms of physical distress.       Average METs 5.1         Resistance Training   Training Prescription Yes       Weight 3 lbs       Reps 10-15       Time 10 Minutes         Interval Training   Interval Training No         Recumbant Elliptical   Level 1       RPM 62       Watts 85       Minutes 15       METs 5         Home Exercise Plan   Plans to continue exercise at Home (comment)       Frequency Add 3 additional days to program exercise sessions.       Initial Home Exercises Provided 11/29/22                 Exercise Comments:   Exercise Comments     Row Name 10/14/22 1410 10/30/22 1500 11/14/22 1113 11/29/22 1417 12/04/22 1403   Exercise Comments Pt's first day in the CRP2 program. Pt exercise today with no complaints. Reviewed METs. Pt is making good progress. Reviewed METs and goals. Pt is progressin to goals. Will consider increases on both modalities next session. Reviewed home exercise Rx with patient today. Pt is exercising at home on her stationary bike for 30 minutes 3x/week. Pt will contunue this. Pt has an elliptical stepper at home so we will drop nustep next session and add elliptical stepper. Pt verbalized understanding of the home exericse Rx and was provided a copy. Reviewed METs. Pt is progressing. Pt is using the Elliptical now. Pt was able to increase time to 7 minutes today. Pt finds the elliptical challanging. Pt has one at home that she she would like to be able to use again. The goal is to get her comfortable back to using her machine at home.    Row Name 12/16/22 1436           Exercise Comments Reviewed goals. Still working on increasing time on Elliptical. Pt has one at home as well.                Exercise Goals and Review:   Exercise Goals     Row Name 10/10/22 1404             Exercise Goals   Increase Physical Activity Yes       Intervention Provide advice, education, support and counseling about physical activity/exercise needs.;Develop an individualized exercise prescription for aerobic and resistive training based on initial evaluation findings, risk stratification, comorbidities and participant's personal goals.       Expected Outcomes Short Term: Attend rehab on a regular basis to increase amount of physical  activity.;Long Term: Add in home exercise to make exercise part of routine and to increase amount of physical activity.;Long Term: Exercising regularly at least 3-5 days a week.       Increase Strength and Stamina Yes       Intervention  Provide advice, education, support and counseling about physical activity/exercise needs.;Develop an individualized exercise prescription for aerobic and resistive training based on initial evaluation findings, risk stratification, comorbidities and participant's personal goals.       Expected Outcomes Short Term: Increase workloads from initial exercise prescription for resistance, speed, and METs.;Short Term: Perform resistance training exercises routinely during rehab and add in resistance training at home;Long Term: Improve cardiorespiratory fitness, muscular endurance and strength as measured by increased METs and functional capacity ( )       Able to understand and use rate of perceived exertion (RPE) scale Yes       Intervention Provide education and explanation on how to use RPE scale       Expected Outcomes Short Term: Able to use RPE daily in rehab to express subjective intensity level;Long Term:  Able to use RPE to guide intensity level when exercising independently       Knowledge and understanding of Target Heart Rate Range (THRR) Yes       Intervention Provide education and explanation of THRR including how the numbers were predicted and where they are located for reference       Expected Outcomes Short Term: Able to state/look up THRR;Long Term: Able to use THRR to govern intensity when exercising independently;Short Term: Able to use daily as guideline for intensity in rehab       Understanding of Exercise Prescription Yes       Intervention Provide education, explanation, and written materials on patient's individual exercise prescription       Expected Outcomes Short Term: Able to explain program exercise prescription;Long Term: Able to explain home exercise prescription to exercise independently                Exercise Goals Re-Evaluation :  Exercise Goals Re-Evaluation     Row Name 10/14/22 1409 11/13/22 1500 12/16/22 1435         Exercise Goal Re-Evaluation    Exercise Goals Review Increase Physical Activity;Increase Strength and Stamina;Able to understand and use rate of perceived exertion (RPE) scale;Knowledge and understanding of Target Heart Rate Range (THRR);Understanding of Exercise Prescription Increase Physical Activity;Increase Strength and Stamina;Able to understand and use rate of perceived exertion (RPE) scale;Knowledge and understanding of Target Heart Rate Range (THRR);Understanding of Exercise Prescription Increase Physical Activity;Increase Strength and Stamina;Able to understand and use rate of perceived exertion (RPE) scale;Knowledge and understanding of Target Heart Rate Range (THRR);Understanding of Exercise Prescription     Comments Pt's first day in the cRP2 program. Pt understands the exercise Rx, RPE scale and THRR, Reviewed METs and goals. Pt's short term goals are increased strength and stamina and to be able to walk a mile. Pt voices that she has achieved these goals. Pt is feeling stronger and walking the mile without difficulty. Reviewed goals. Peak METs 5.1. Strength and stamina have improved per patient.     Expected Outcomes Will continue to montior patient and progress exercise workloads as tolerated. Will continue to montior patient and progress exercise workloads as tolerated. Will continue to montior patient and progress exercise workloads as tolerated.              Discharge Exercise Prescription (Final Exercise Prescription Changes):  Exercise Prescription Changes - 12/16/22 1433       Response to Exercise   Blood Pressure (Admit) 126/64    Blood Pressure (Exercise) 138/58    Blood Pressure (Exit) 120/70    Heart Rate (Admit) 67 bpm    Heart Rate (Exercise) 87 bpm    Heart Rate (Exit) 63 bpm    Rating of Perceived Exertion (Exercise) 12    Symptoms None    Comments Reviewed goals    Duration Continue with 30 min of aerobic exercise without signs/symptoms of physical distress.    Intensity THRR unchanged       Progression   Progression Continue to progress workloads to maintain intensity without signs/symptoms of physical distress.    Average METs 5.1      Resistance Training   Training Prescription Yes    Weight 3 lbs    Reps 10-15    Time 10 Minutes      Interval Training   Interval Training No      Recumbant Elliptical   Level 1    RPM 62    Watts 85    Minutes 15    METs 5      Home Exercise Plan   Plans to continue exercise at Home (comment)    Frequency Add 3 additional days to program exercise sessions.    Initial Home Exercises Provided 11/29/22             Nutrition:  Target Goals: Understanding of nutrition guidelines, daily intake of sodium 1500mg , cholesterol 200mg , calories 30% from fat and 7% or less from saturated fats, daily to have 5 or more servings of fruits and vegetables.  Biometrics:  Pre Biometrics - 10/10/22 1124       Pre Biometrics   Waist Circumference 35 inches    Hip Circumference 38 inches    Waist to Hip Ratio 0.92 %    Triceps Skinfold 16 mm    % Body Fat 33.2 %    Grip Strength 41 kg    Flexibility 13 in    Single Leg Stand 30 seconds             Post Biometrics - 12/16/22 1406        Post  Biometrics   Height 5' 6.75" (1.695 m)    Weight 69.1 kg    Waist Circumference 34 inches    Hip Circumference 37.5 inches    Waist to Hip Ratio 0.91 %    BMI (Calculated) 24.05    Triceps Skinfold 17 mm    % Body Fat 33.2 %    Grip Strength 45 kg    Flexibility 12.25 in    Single Leg Stand 30 seconds             Nutrition Therapy Plan and Nutrition Goals:  Nutrition Therapy & Goals - 12/12/22 1447       Nutrition Therapy   Diet Heart healthy diet    Drug/Food Interactions Statins/Certain Fruits      Personal Nutrition Goals   Nutrition Goal Patient to identify strategies for reducing cardiovascular risk by attending the [employer] education and nutrition series weekly.    Personal Goal #2 Patient to improve diet  quality by using the plate method as a guide for meal planning to include lean protein/plant protein, fruits, vegetables, whole grains, nonfat dairy as part of a well-balanced diet.    Personal Goal #3 Patient to reduce sodium intake to 1500mg  per day  Comments Goals in action. Golda continues to attend the [employer] and nutrition series regularly. She has started making many dietary changes including reduced sodium and increased dietary fiber and implemented many Pritikin recipes. She is down 4.8# since starting with our program; she reports readiness to maintain weight at this time. Reviewed strategies for weight maintenance. Patient will benefit from participation in intensive cardiac rehab for nutrition, exercise, and lifestyle modification.      Intervention Plan   Intervention Prescribe, educate and counsel regarding individualized specific dietary modifications aiming towards targeted core components such as weight, hypertension, lipid management, diabetes, heart failure and other comorbidities.;Nutrition handout(s) given to patient.    Expected Outcomes Short Term Goal: Understand basic principles of dietary content, such as calories, fat, sodium, cholesterol and nutrients.;Long Term Goal: Adherence to prescribed nutrition plan.             Nutrition Assessments:  Nutrition Assessments - 10/17/22 1629       Rate Your Plate Scores   Pre Score 68            MEDIFICTS Score Key: ?70 Need to make dietary changes  40-70 Heart Healthy Diet ? 40 Therapeutic Level Cholesterol Diet   Flowsheet Row CARDIAC REHAB PHASE II EXERCISE from 10/16/2022 in [facility], [facility], & [facility]  Picture Your Plate Total Score on Admission 68      Picture Your Plate Scores: <16 Unhealthy dietary pattern with much room for improvement. 41-50 Dietary pattern unlikely to meet recommendations for good health and room for improvement. 51-60 More  healthful dietary pattern, with some room for improvement.  >60 Healthy dietary pattern, although there may be some specific behaviors that could be improved.    Nutrition Goals Re-Evaluation:  Nutrition Goals Re-Evaluation     Row Name 10/14/22 1508 11/14/22 0909 12/12/22 1447         Goals   Current Weight 149 lb 4 oz (67.7 kg) 145 lb 11.6 oz (66.1 kg) 144 lb 10 oz (65.6 kg)     Comment lipids drastically improved WNL, lipoprotein A169.2, AST 45, ALT 96 no new labs; most recent labs lipids drastically improved WNL, lipoprotein A169.2, AST 45, ALT 96 no new labs; most recent labs lipids drastically improved WNL, lipoprotein A169.2, AST 45, ALT 96     Expected Outcome Patient will benefit from participation in intensive cardiac rehab for nutrition, exercise, and lifestyle modification. Goals in action. Noralee continues to attend the [employer] and nutrition series regularly. She has started making many dietary changes including reduced sodium and increased dietary fiber and implemented many Pritikin recipes. She is down 3.7# since starting with our program. Patient will benefit from participation in intensive cardiac rehab for nutrition, exercise, and lifestyle modification. Goals in action. Erie continues to attend the [employer] and nutrition series regularly. She has started making many dietary changes including reduced sodium and increased dietary fiber and implemented many Pritikin recipes. She is down 4.8# since starting with our program; she reports readiness to maintain weight at this time. Reviewed strategies for weight maintenance. Patient will benefit from participation in intensive cardiac rehab for nutrition, exercise, and lifestyle modification.              Nutrition Goals Re-Evaluation:  Nutrition Goals Re-Evaluation     Row Name 10/14/22 1508 11/14/22 0909 12/12/22 1447         Goals   Current Weight 149 lb 4 oz (67.7 kg) 145 lb  11.6 oz (66.1 kg) 144  lb 10 oz (65.6 kg)     Comment lipids drastically improved WNL, lipoprotein A169.2, AST 45, ALT 96 no new labs; most recent labs lipids drastically improved WNL, lipoprotein A169.2, AST 45, ALT 96 no new labs; most recent labs lipids drastically improved WNL, lipoprotein A169.2, AST 45, ALT 96     Expected Outcome Patient will benefit from participation in intensive cardiac rehab for nutrition, exercise, and lifestyle modification. Goals in action. Nalaysia continues to attend the [employer] and nutrition series regularly. She has started making many dietary changes including reduced sodium and increased dietary fiber and implemented many Pritikin recipes. She is down 3.7# since starting with our program. Patient will benefit from participation in intensive cardiac rehab for nutrition, exercise, and lifestyle modification. Goals in action. Quanda continues to attend the [employer] and nutrition series regularly. She has started making many dietary changes including reduced sodium and increased dietary fiber and implemented many Pritikin recipes. She is down 4.8# since starting with our program; she reports readiness to maintain weight at this time. Reviewed strategies for weight maintenance. Patient will benefit from participation in intensive cardiac rehab for nutrition, exercise, and lifestyle modification.              Nutrition Goals Discharge (Final Nutrition Goals Re-Evaluation):  Nutrition Goals Re-Evaluation - 12/12/22 1447       Goals   Current Weight 144 lb 10 oz (65.6 kg)    Comment no new labs; most recent labs lipids drastically improved WNL, lipoprotein A169.2, AST 45, ALT 96    Expected Outcome Goals in action. Emberley continues to attend the [employer] and nutrition series regularly. She has started making many dietary changes including reduced sodium and increased dietary fiber and implemented many Pritikin recipes. She is down 4.8# since starting with our  program; she reports readiness to maintain weight at this time. Reviewed strategies for weight maintenance. Patient will benefit from participation in intensive cardiac rehab for nutrition, exercise, and lifestyle modification.             Psychosocial: Target Goals: Acknowledge presence or absence of significant depression and/or stress, maximize coping skills, provide positive support system. Participant is able to verbalize types and ability to use techniques and skills needed for reducing stress and depression.  Initial Review & Psychosocial Screening:  Initial Psych Review & Screening - 10/10/22 1142       Initial Review   Current issues with None Identified      Family Dynamics   Good Support System? Yes   Pt has son and daughter for support     Barriers   Psychosocial barriers to participate in program There are no identifiable barriers or psychosocial needs.      Screening Interventions   Interventions Encouraged to exercise             Quality of Life Scores:  Quality of Life - 10/10/22 1406       Quality of Life   Select Quality of Life      Quality of Life Scores   Health/Function Pre 27.2 %    Socioeconomic Pre 30 %    Psych/Spiritual Pre 30 %    Family Pre 30 %    GLOBAL Pre 28.8 %            Scores of 19 and below usually indicate a poorer quality of life in these areas.  A difference of  2-3 points is a  clinically meaningful difference.  A difference of 2-3 points in the total score of the Quality of Life Index has been associated with significant improvement in overall quality of life, self-image, physical symptoms, and general health in studies assessing change in quality of life.  PHQ-9: Review Flowsheet       10/10/2022 07/30/2022  Depression screen PHQ 2/9  Decreased Interest 0 0  Down, Depressed, Hopeless 0 0  PHQ - 2 Score 0 0  Altered sleeping 0 0  Tired, decreased energy 3 3  Change in appetite 0 0  Feeling bad or failure about  yourself  0 0  Trouble concentrating 0 0  Moving slowly or fidgety/restless 0 0  Suicidal thoughts 0 0  PHQ-9 Score 3 3  Difficult doing work/chores Somewhat difficult Not difficult at all   Interpretation of Total Score  Total Score Depression Severity:  1-4 = Minimal depression, 5-9 = Mild depression, 10-14 = Moderate depression, 15-19 = Moderately severe depression, 20-27 = Severe depression   Psychosocial Evaluation and Intervention:   Psychosocial Re-Evaluation:  Psychosocial Re-Evaluation     Row Name 10/14/22 1434 10/22/22 1740 11/19/22 1540 12/18/22 0953       Psychosocial Re-Evaluation   Current issues with None Identified None Identified None Identified None Identified    Interventions Encouraged to attend Cardiac Rehabilitation for the exercise Encouraged to attend Cardiac Rehabilitation for the exercise Encouraged to attend Cardiac Rehabilitation for the exercise Encouraged to attend Cardiac Rehabilitation for the exercise    Continue Psychosocial Services  No Follow up required No Follow up required No Follow up required No Follow up required             Psychosocial Discharge (Final Psychosocial Re-Evaluation):  Psychosocial Re-Evaluation - 12/18/22 0953       Psychosocial Re-Evaluation   Current issues with None Identified    Interventions Encouraged to attend Cardiac Rehabilitation for the exercise    Continue Psychosocial Services  No Follow up required             Vocational Rehabilitation: Provide vocational rehab assistance to qualifying candidates.   Vocational Rehab Evaluation & Intervention:  Vocational Rehab - 10/10/22 1142       Initial Vocational Rehab Evaluation & Intervention   Assessment shows need for Vocational Rehabilitation No   Pt denies any vocational needs at this time            Education: Education Goals: Education classes will be provided on a weekly basis, covering required topics. Participant will state  understanding/return demonstration of topics presented.    Education     Row Name 10/14/22 1500     Education   Cardiac Education Topics Pritikin   Licensed conveyancer Nutrition   Nutrition Facts on Fat   Instruction Review Code 1- Verbalizes Understanding   Class Start Time 1400   Class Stop Time 1439   Class Time Calculation (min) 39 min    Row Name 10/16/22 1600     Education   Cardiac Education Topics Pritikin   Customer service manager   Weekly Topic Fast Evening Meals   Instruction Review Code 1- Verbalizes Understanding   Class Start Time 1400   Class Stop Time 1440   Class Time Calculation (min) 40 min    Row Name 10/21/22 1700     Education   Cardiac Education  Topics Pritikin   Geographical information systems officer Psychosocial   Psychosocial Workshop Healthy Sleep for a Healthy Heart   Instruction Review Code 1- Verbalizes Understanding   Class Start Time 1145   Class Stop Time 1233   Class Time Calculation (min) 48 min    Row Name 10/23/22 1400     Education   Cardiac Education Topics Pritikin   Customer service manager   Weekly Topic International Cuisine- Spotlight on the [employer] Zones   Instruction Review Code 1- Verbalizes Understanding   Class Start Time 1140   Class Stop Time 1212   Class Time Calculation (min) 32 min    Row Name 10/28/22 1600     Education   Cardiac Education Topics Pritikin   Glass blower/designer Nutrition   Instruction Review Code 1- Verbalizes Understanding   Class Start Time 1145   Class Stop Time 1230   Class Time Calculation (min) 45 min    Row Name 10/30/22 1300     Education   Cardiac Education Topics Pritikin   Customer service manager   Weekly Topic Simple Sides and  Sauces   Instruction Review Code 1- Verbalizes Understanding   Class Start Time 1140   Class Stop Time 1215   Class Time Calculation (min) 35 min    Row Name 11/01/22 1300     Education   Cardiac Education Topics Pritikin   Nurse, children's Exercise Physiologist   Select Psychosocial   Psychosocial How Our Thoughts Can Heal Our Hearts   Instruction Review Code 1- Verbalizes Understanding   Class Start Time 1150   Class Stop Time 1226   Class Time Calculation (min) 36 min    Row Name 11/04/22 1200     Education   Cardiac Education Topics Pritikin     Workshops   Biomedical scientist Psychosocial   Psychosocial Workshop From Head to Heart: The Power of a Healthy Outlook   Instruction Review Code 1- Verbalizes Understanding   Class Start Time 1153   Class Stop Time 1237   Class Time Calculation (min) 44 min    Row Name 11/06/22 1300     Education   Cardiac Education Topics Pritikin   Customer service manager   Weekly Topic Powerhouse Plant-Based Proteins   Instruction Review Code 1- Verbalizes Understanding   Class Start Time 1135   Class Stop Time 1210   Class Time Calculation (min) 35 min    Row Name 11/08/22 1400     Education   Cardiac Education Topics Pritikin   Psychologist, forensic General Education   General Education Hypertension and Heart Disease   Instruction Review Code 1- Verbalizes Understanding   Class Start Time 1145   Class Stop Time 1220   Class Time Calculation (min) 35 min    Row Name 11/13/22 1500     Education   Cardiac Education Topics Pritikin   Customer service manager   Weekly Topic Adding Flavor - Sodium-Free   Instruction  Review Code 1- Verbalizes Understanding   Class Start Time 1400   Class Stop Time 1445   Class Time Calculation (min) 45  min    Row Name 11/15/22 1200     Education   Cardiac Education Topics Pritikin   Hospital doctor Education   General Education Heart Disease Risk Reduction   Instruction Review Code 1- Verbalizes Understanding   Class Start Time 1135   Class Stop Time 1210   Class Time Calculation (min) 35 min    Row Name 11/18/22 1300     Education   Cardiac Education Topics Pritikin   Geographical information systems officer Exercise   Exercise Workshop Location manager and Fall Prevention   Instruction Review Code 1- Verbalizes Understanding   Class Start Time 1146   Class Stop Time 1226   Class Time Calculation (min) 40 min    Row Name 11/20/22 1300     Education   Cardiac Education Topics Pritikin   Customer service manager   Weekly Topic Fast and Healthy Breakfasts   Instruction Review Code 1- Verbalizes Understanding   Class Start Time 1140   Class Stop Time 1212   Class Time Calculation (min) 32 min    Row Name 11/22/22 1300     Education   Cardiac Education Topics Pritikin   Licensed conveyancer Nutrition   Nutrition Overview of the Pritikin Eating Plan   Instruction Review Code 1- Verbalizes Understanding   Class Start Time 1145   Class Stop Time 1220   Class Time Calculation (min) 35 min    Row Name 11/25/22 1600     Education   Cardiac Education Topics Pritikin   Select Workshops     Workshops   Educator Exercise Physiologist   Select Psychosocial   Psychosocial Workshop Recognizing and Reducing Stress   Instruction Review Code 1- Verbalizes Understanding   Class Start Time 1152   Class Stop Time 1234   Class Time Calculation (min) 42 min    Row Name 11/27/22 1300     Education   Cardiac Education Topics Pritikin   Cabin crew   Weekly Topic Personalizing Your Pritikin Plate   Instruction Review Code 1- Verbalizes Understanding   Class Start Time 1145   Class Stop Time 1220   Class Time Calculation (min) 35 min    Row Name 11/29/22 1200     Education   Cardiac Education Topics Pritikin   Nurse, children's Exercise Physiologist   Select Psychosocial   Psychosocial Healthy Minds, Bodies, Hearts   Instruction Review Code 1- Verbalizes Understanding   Class Start Time 1150   Class Stop Time 1236   Class Time Calculation (min) 46 min    Row Name 12/02/22 1600     Education   Cardiac Education Topics Pritikin   Glass blower/designer Nutrition   Nutrition Workshop Label Reading   Instruction Review Code 1- Verbalizes Understanding   Class Start Time 1145   Class Stop Time 1221   Class Time Calculation (min) 36  min    Row Name 12/04/22 1500     Education   Cardiac Education Topics Pritikin   Orthoptist   Educator Dietitian   Weekly Topic [employer] Desserts   Instruction Review Code 1- Verbalizes Understanding   Class Start Time 1145   Class Stop Time 1235   Class Time Calculation (min) 50 min    Row Name 12/06/22 1300     Education   Cardiac Education Topics Pritikin   Licensed conveyancer Nutrition   Nutrition Other  label reading   Instruction Review Code 1- Verbalizes Understanding   Class Start Time 1147   Class Stop Time 1226   Class Time Calculation (min) 39 min    Row Name 12/09/22 1300     Education   Cardiac Education Topics Pritikin   Geographical information systems officer Exercise   Instruction Review Code 1- Verbalizes Understanding   Class Start Time 1145   Class Stop Time 1230   Class Time Calculation (min) 45 min    Row Name 12/16/22 1200     Education    Cardiac Education Topics Pritikin   Licensed conveyancer Nutrition   Nutrition Calorie Density   Instruction Review Code 1- Verbalizes Understanding   Class Start Time 1145   Class Stop Time 1223   Class Time Calculation (min) 38 min            Core Videos: Exercise    Move It!  Clinical staff conducted group or individual video education with verbal and written material and guidebook.  Patient learns the recommended Pritikin exercise program. Exercise with the goal of living a long, healthy life. Some of the health benefits of exercise include controlled diabetes, healthier blood pressure levels, improved cholesterol levels, improved heart and lung capacity, improved sleep, and better body composition. Everyone should speak with their doctor before starting or changing an exercise routine.  Biomechanical Limitations Clinical staff conducted group or individual video education with verbal and written material and guidebook.  Patient learns how biomechanical limitations can impact exercise and how we can mitigate and possibly overcome limitations to have an impactful and balanced exercise routine.  Body Composition Clinical staff conducted group or individual video education with verbal and written material and guidebook.  Patient learns that body composition (ratio of muscle mass to fat mass) is a key component to assessing overall fitness, rather than body weight alone. Increased fat mass, especially visceral belly fat, can put [place] at increased risk for metabolic syndrome, type 2 diabetes, heart disease, and even death. It is recommended to combine diet and exercise (cardiovascular and resistance training) to improve your body composition. Seek guidance from your physician and exercise physiologist before implementing an exercise routine.  Exercise Action Plan Clinical staff conducted group or individual video education with verbal and  written material and guidebook.  Patient learns the recommended strategies to achieve and enjoy long-term exercise adherence, including variety, self-motivation, self-efficacy, and positive decision making. Benefits of exercise include fitness, good health, weight management, more energy, better sleep, less stress, and overall well-being.  Medical   Heart Disease Risk Reduction Clinical staff conducted group or individual video education with verbal and written material and guidebook.  Patient learns our heart is our  most vital organ as it circulates oxygen, nutrients, white blood cells, and hormones throughout the entire body, and carries waste away. Data supports a plant-based eating plan like the Pritikin Program for its effectiveness in slowing progression of and reversing heart disease. The video provides a number of recommendations to address heart disease.   Metabolic Syndrome and Belly Fat  Clinical staff conducted group or individual video education with verbal and written material and guidebook.  Patient learns what metabolic syndrome is, how it leads to heart disease, and how one can reverse it and keep it from coming back. You have metabolic syndrome if you have 3 of the following 5 criteria: abdominal obesity, high blood pressure, high triglycerides, low HDL cholesterol, and high blood sugar.  Hypertension and Heart Disease Clinical staff conducted group or individual video education with verbal and written material and guidebook.  Patient learns that high blood pressure, or hypertension, is very common in the [place]. Hypertension is largely due to excessive salt intake, but other important risk factors include being overweight, physical inactivity, drinking too much alcohol, smoking, and not eating enough potassium from fruits and vegetables. High blood pressure is a leading risk factor for heart attack, stroke, congestive heart failure, dementia, kidney failure, and premature  death. Long-term effects of excessive salt intake include stiffening of the arteries and thickening of heart muscle and organ damage. Recommendations include ways to reduce hypertension and the risk of heart disease.  Diseases of Our Time - Focusing on Diabetes Clinical staff conducted group or individual video education with verbal and written material and guidebook.  Patient learns why the best way to stop diseases of our time is prevention, through food and other lifestyle changes. Medicine (such as prescription pills and surgeries) is often only a Band-Aid on the problem, not a long-term solution. Most common diseases of our time include obesity, type 2 diabetes, hypertension, heart disease, and cancer. The Pritikin Program is recommended and has been proven to help reduce, reverse, and/or prevent the damaging effects of metabolic syndrome.  Nutrition   Overview of the Pritikin Eating Plan  Clinical staff conducted group or individual video education with verbal and written material and guidebook.  Patient learns about the Pritikin Eating Plan for disease risk reduction. The Pritikin Eating Plan emphasizes a wide variety of unrefined, minimally-processed carbohydrates, like fruits, vegetables, whole grains, and legumes. Go, Caution, and Stop food choices are explained. Plant-based and lean animal proteins are emphasized. Rationale provided for low sodium intake for blood pressure control, low added sugars for blood sugar stabilization, and low added fats and oils for coronary artery disease risk reduction and weight management.  Calorie Density  Clinical staff conducted group or individual video education with verbal and written material and guidebook.  Patient learns about calorie density and how it impacts the Pritikin Eating Plan. Knowing the characteristics of the food you choose will help you decide whether those foods will lead to weight gain or weight loss, and whether you want to consume  more or less of them. Weight loss is usually a side effect of the Pritikin Eating Plan because of its focus on low calorie-dense foods.  Label Reading  Clinical staff conducted group or individual video education with verbal and written material and guidebook.  Patient learns about the Pritikin recommended label reading guidelines and corresponding recommendations regarding calorie density, added sugars, sodium content, and whole grains.  Dining Out - Part 1  Clinical staff conducted group or individual video education with  verbal and written material and guidebook.  Patient learns that restaurant meals can be sabotaging because they can be so high in calories, fat, sodium, and/or sugar. Patient learns recommended strategies on how to positively address this and avoid unhealthy pitfalls.  Facts on Fats  Clinical staff conducted group or individual video education with verbal and written material and guidebook.  Patient learns that lifestyle modifications can be just as effective, if not more so, as many medications for lowering your risk of heart disease. A Pritikin lifestyle can help to reduce your risk of inflammation and atherosclerosis (cholesterol build-up, or plaque, in the artery walls). Lifestyle interventions such as dietary choices and physical activity address the cause of atherosclerosis. A review of the types of fats and their impact on blood cholesterol levels, along with dietary recommendations to reduce fat intake is also included.  Nutrition Action Plan  Clinical staff conducted group or individual video education with verbal and written material and guidebook.  Patient learns how to incorporate Pritikin recommendations into their lifestyle. Recommendations include planning and keeping personal health goals in mind as an important part of their success.  Healthy Mind-Set    Healthy Minds, Bodies, Hearts  Clinical staff conducted group or individual video education with verbal and  written material and guidebook.  Patient learns how to identify when they are stressed. Video will discuss the impact of that stress, as well as the many benefits of stress management. Patient will also be introduced to stress management techniques. The way we think, act, and feel has an impact on our hearts.  How Our Thoughts Can Heal Our Hearts  Clinical staff conducted group or individual video education with verbal and written material and guidebook.  Patient learns that negative thoughts can cause depression and anxiety. This can result in negative lifestyle behavior and serious health problems. Cognitive behavioral therapy is an effective method to help control our thoughts in order to change and improve our emotional outlook.  Additional Videos:  Exercise    Improving Performance  Clinical staff conducted group or individual video education with verbal and written material and guidebook.  Patient learns to use a non-linear approach by alternating intensity levels and lengths of time spent exercising to help burn more calories and lose more body fat. Cardiovascular exercise helps improve heart health, metabolism, hormonal balance, blood sugar control, and recovery from fatigue. Resistance training improves strength, endurance, balance, coordination, reaction time, metabolism, and muscle mass. Flexibility exercise improves circulation, posture, and balance. Seek guidance from your physician and exercise physiologist before implementing an exercise routine and learn your capabilities and proper form for all exercise.  Introduction to Yoga  Clinical staff conducted group or individual video education with verbal and written material and guidebook.  Patient learns about yoga, a discipline of the coming together of mind, breath, and body. The benefits of yoga include improved flexibility, improved range of motion, better posture and core strength, increased lung function, weight loss, and positive  self-image. Yoga's heart health benefits include lowered blood pressure, healthier heart rate, decreased cholesterol and triglyceride levels, improved immune function, and reduced stress. Seek guidance from your physician and exercise physiologist before implementing an exercise routine and learn your capabilities and proper form for all exercise.  Medical   Aging: Enhancing Your Quality of Life  Clinical staff conducted group or individual video education with verbal and written material and guidebook.  Patient learns key strategies and recommendations to stay in good physical health and enhance quality of life,  such as prevention strategies, having an advocate, securing a Health Care Proxy and Power of Attorney, and keeping a list of medications and system for tracking them. It also discusses how to avoid risk for bone loss.  Biology of Weight Control  Clinical staff conducted group or individual video education with verbal and written material and guidebook.  Patient learns that weight gain occurs because we consume more calories than we burn (eating more, moving less). Even if your body weight is normal, you may have higher ratios of fat compared to muscle mass. Too much body fat puts you at increased risk for cardiovascular disease, heart attack, stroke, type 2 diabetes, and obesity-related cancers. In addition to exercise, following the Pritikin Eating Plan can help reduce your risk.  Decoding Lab Results  Clinical staff conducted group or individual video education with verbal and written material and guidebook.  Patient learns that lab test reflects one measurement whose values change over time and are influenced by many factors, including medication, stress, sleep, exercise, food, hydration, pre-existing medical conditions, and more. It is recommended to use the knowledge from this video to become more involved with your lab results and evaluate your numbers to speak with your  doctor.   Diseases of Our Time - Overview  Clinical staff conducted group or individual video education with verbal and written material and guidebook.  Patient learns that according to the CDC, 50% to 70% of chronic diseases (such as obesity, type 2 diabetes, elevated lipids, hypertension, and heart disease) are avoidable through lifestyle improvements including healthier food choices, listening to satiety cues, and increased physical activity.  Sleep Disorders Clinical staff conducted group or individual video education with verbal and written material and guidebook.  Patient learns how good quality and duration of sleep are important to overall health and well-being. Patient also learns about sleep disorders and how they impact health along with recommendations to address them, including discussing with a physician.  Nutrition  Dining Out - Part 2 Clinical staff conducted group or individual video education with verbal and written material and guidebook.  Patient learns how to plan ahead and communicate in order to maximize their dining experience in a healthy and nutritious manner. Included are recommended food choices based on the type of restaurant the patient is visiting.   Fueling a Banker conducted group or individual video education with verbal and written material and guidebook.  There is a strong connection between our food choices and our health. Diseases like obesity and type 2 diabetes are very prevalent and are in large-part due to lifestyle choices. The Pritikin Eating Plan provides plenty of food and hunger-curbing satisfaction. It is easy to follow, affordable, and helps reduce health risks.  Menu Workshop  Clinical staff conducted group or individual video education with verbal and written material and guidebook.  Patient learns that restaurant meals can sabotage health goals because they are often packed with calories, fat, sodium, and sugar.  Recommendations include strategies to plan ahead and to communicate with the manager, chef, or server to help order a healthier meal.  Planning Your Eating Strategy  Clinical staff conducted group or individual video education with verbal and written material and guidebook.  Patient learns about the Pritikin Eating Plan and its benefit of reducing the risk of disease. The Pritikin Eating Plan does not focus on calories. Instead, it emphasizes high-quality, nutrient-rich foods. By knowing the characteristics of the foods, we choose, we can determine their calorie density and  make informed decisions.  Targeting Your Nutrition Priorities  Clinical staff conducted group or individual video education with verbal and written material and guidebook.  Patient learns that lifestyle habits have a tremendous impact on disease risk and progression. This video provides eating and physical activity recommendations based on your personal health goals, such as reducing LDL cholesterol, losing weight, preventing or controlling type 2 diabetes, and reducing high blood pressure.  Vitamins and Minerals  Clinical staff conducted group or individual video education with verbal and written material and guidebook.  Patient learns different ways to obtain key vitamins and minerals, including through a recommended healthy diet. It is important to discuss all supplements you take with your doctor.   Healthy Mind-Set    Smoking Cessation  Clinical staff conducted group or individual video education with verbal and written material and guidebook.  Patient learns that cigarette smoking and tobacco addiction pose a serious health risk which affects millions of people. Stopping smoking will significantly reduce the risk of heart disease, lung disease, and many forms of cancer. Recommended strategies for quitting are covered, including working with your doctor to develop a successful plan.  Culinary   Becoming a Corporate investment banker conducted group or individual video education with verbal and written material and guidebook.  Patient learns that cooking at home can be healthy, cost-effective, quick, and puts them in control. Keys to cooking healthy recipes will include looking at your recipe, assessing your equipment needs, planning ahead, making it simple, choosing cost-effective seasonal ingredients, and limiting the use of added fats, salts, and sugars.  Cooking - Breakfast and Snacks  Clinical staff conducted group or individual video education with verbal and written material and guidebook.  Patient learns how important breakfast is to satiety and nutrition through the entire day. Recommendations include key foods to eat during breakfast to help stabilize blood sugar levels and to prevent overeating at meals later in the day. Planning ahead is also a key component.  Cooking - Educational psychologist conducted group or individual video education with verbal and written material and guidebook.  Patient learns eating strategies to improve overall health, including an approach to cook more at home. Recommendations include thinking of animal protein as a side on your plate rather than center stage and focusing instead on lower calorie dense options like vegetables, fruits, whole grains, and plant-based proteins, such as beans. Making sauces in large quantities to freeze for later and leaving the skin on your vegetables are also recommended to maximize your experience.  Cooking - Healthy Salads and Dressing Clinical staff conducted group or individual video education with verbal and written material and guidebook.  Patient learns that vegetables, fruits, whole grains, and legumes are the foundations of the Pritikin Eating Plan. Recommendations include how to incorporate each of these in flavorful and healthy salads, and how to create homemade salad dressings. Proper handling of ingredients is also covered.  Cooking - Soups and [employer] - Soups and Desserts Clinical staff conducted group or individual video education with verbal and written material and guidebook.  Patient learns that Pritikin soups and desserts make for easy, nutritious, and delicious snacks and meal components that are low in sodium, fat, sugar, and calorie density, while high in vitamins, minerals, and filling fiber. Recommendations include simple and healthy ideas for soups and desserts.   Overview     The Pritikin Solution Program Overview Clinical staff conducted group or individual video education with verbal  and written material and guidebook.  Patient learns that the results of the Pritikin Program have been documented in more than 100 articles published in peer-reviewed journals, and the benefits include reducing risk factors for (and, in some cases, even reversing) high cholesterol, high blood pressure, type 2 diabetes, obesity, and more! An overview of the three key pillars of the Pritikin Program will be covered: eating well, doing regular exercise, and having a healthy mind-set.  WORKSHOPS  Exercise: Exercise Basics: Building Your Action Plan Clinical staff led group instruction and group discussion with PowerPoint presentation and patient guidebook. To enhance the learning environment the use of posters, models and videos may be added. At the conclusion of this workshop, patients will comprehend the difference between physical activity and exercise, as well as the benefits of incorporating both, into their routine. Patients will understand the FITT (Frequency, Intensity, Time, and Type) principle and how to use it to build an exercise action plan. In addition, safety concerns and other considerations for exercise and cardiac rehab will be addressed by the presenter. The purpose of this lesson is to promote a comprehensive and effective weekly exercise routine in order to improve patients' overall level of  fitness.   Managing Heart Disease: Your Path to a Healthier Heart Clinical staff led group instruction and group discussion with PowerPoint presentation and patient guidebook. To enhance the learning environment the use of posters, models and videos may be added.At the conclusion of this workshop, patients will understand the anatomy and physiology of the heart. Additionally, they will understand how Pritikin's three pillars impact the risk factors, the progression, and the management of heart disease.  The purpose of this lesson is to provide a high-level overview of the heart, heart disease, and how the Pritikin lifestyle positively impacts risk factors.  Exercise Biomechanics Clinical staff led group instruction and group discussion with PowerPoint presentation and patient guidebook. To enhance the learning environment the use of posters, models and videos may be added. Patients will learn how the structural parts of their bodies function and how these functions impact their daily activities, movement, and exercise. Patients will learn how to promote a neutral spine, learn how to manage pain, and identify ways to improve their physical movement in order to promote healthy living. The purpose of this lesson is to expose patients to common physical limitations that impact physical activity. Participants will learn practical ways to adapt and manage aches and pains, and to minimize their effect on regular exercise. Patients will learn how to maintain good posture while sitting, walking, and lifting.  Balance Training and Fall Prevention  Clinical staff led group instruction and group discussion with PowerPoint presentation and patient guidebook. To enhance the learning environment the use of posters, models and videos may be added. At the conclusion of this workshop, patients will understand the importance of their sensorimotor skills (vision, proprioception, and the vestibular system)  in maintaining their ability to balance as they age. Patients will apply a variety of balancing exercises that are appropriate for their current level of function. Patients will understand the common causes for poor balance, possible solutions to these problems, and ways to modify their physical environment in order to minimize their fall risk. The purpose of this lesson is to teach patients about the importance of maintaining balance as they age and ways to minimize their risk of falling.  WORKSHOPS   Nutrition:  Fueling a Ship broker led group instruction and group discussion with PowerPoint presentation and  patient guidebook. To enhance the learning environment the use of posters, models and videos may be added. Patients will review the foundational principles of the Pritikin Eating Plan and understand what constitutes a serving size in each of the food groups. Patients will also learn Pritikin-friendly foods that are better choices when away from home and review make-ahead meal and snack options. Calorie density will be reviewed and applied to three nutrition priorities: weight maintenance, weight loss, and weight gain. The purpose of this lesson is to reinforce (in a group setting) the key concepts around what patients are recommended to eat and how to apply these guidelines when away from home by planning and selecting Pritikin-friendly options. Patients will understand how calorie density may be adjusted for different weight management goals.  Mindful Eating  Clinical staff led group instruction and group discussion with PowerPoint presentation and patient guidebook. To enhance the learning environment the use of posters, models and videos may be added. Patients will briefly review the concepts of the Pritikin Eating Plan and the importance of low-calorie dense foods. The concept of mindful eating will be introduced as well as the importance of paying attention to internal hunger  signals. Triggers for non-hunger eating and techniques for dealing with triggers will be explored. The purpose of this lesson is to provide patients with the opportunity to review the basic principles of the Pritikin Eating Plan, discuss the value of eating mindfully and how to measure internal cues of hunger and fullness using the Hunger Scale. Patients will also discuss reasons for non-hunger eating and learn strategies to use for controlling emotional eating.  Targeting Your Nutrition Priorities Clinical staff led group instruction and group discussion with PowerPoint presentation and patient guidebook. To enhance the learning environment the use of posters, models and videos may be added. Patients will learn how to determine their genetic susceptibility to disease by reviewing their family history. Patients will gain insight into the importance of diet as part of an overall healthy lifestyle in mitigating the impact of genetics and other environmental insults. The purpose of this lesson is to provide patients with the opportunity to assess their personal nutrition priorities by looking at their family history, their own health history and current risk factors. Patients will also be able to discuss ways of prioritizing and modifying the Pritikin Eating Plan for their highest risk areas  Menu  Clinical staff led group instruction and group discussion with PowerPoint presentation and patient guidebook. To enhance the learning environment the use of posters, models and videos may be added. Using menus brought in from [employer], or printed from [employer], patients will apply the Pritikin dining out guidelines that were presented in the [employer] video. Patients will also be able to practice these guidelines in a variety of provided scenarios. The purpose of this lesson is to provide patients with the opportunity to practice hands-on learning of the Pritikin Dining Out guidelines  with actual menus and practice scenarios.  Label Reading Clinical staff led group instruction and group discussion with PowerPoint presentation and patient guidebook. To enhance the learning environment the use of posters, models and videos may be added. Patients will review and discuss the Pritikin label reading guidelines presented in Pritikin's Label Reading Educational series video. Using fool labels brought in from local grocery stores and markets, patients will apply the label reading guidelines and determine if the packaged food meet the Pritikin guidelines. The purpose of this lesson is to provide patients with the  opportunity to review, discuss, and practice hands-on learning of the Pritikin Label Reading guidelines with actual packaged food labels. Cooking School  Pritikin's [employer] are designed to teach patients ways to prepare quick, simple, and affordable recipes at home. The importance of nutrition's role in chronic disease risk reduction is reflected in its emphasis in the overall Pritikin program. By learning how to prepare essential core Pritikin Eating Plan recipes, patients will increase control over what they eat; be able to customize the flavor of foods without the use of added salt, sugar, or fat; and improve the quality of the food they consume. By learning a set of core recipes which are easily assembled, quickly prepared, and affordable, patients are more likely to prepare more healthy foods at home. These workshops focus on convenient breakfasts, simple entres, side dishes, and desserts which can be prepared with minimal effort and are consistent with nutrition recommendations for cardiovascular risk reduction. Cooking [employer] are taught by a Armed forces logistics/support/administrative officer (RD) who has been trained by the [employer]. The chef or RD has a clear understanding of the importance of minimizing - if not completely eliminating - added fat, sugar, and  sodium in recipes. Throughout the series of Cooking School Workshop sessions, patients will learn about healthy ingredients and efficient methods of cooking to build confidence in their capability to prepare    Cooking School weekly topics:  Adding Flavor- Sodium-Free  Fast and Healthy Breakfasts  Powerhouse Plant-Based Proteins  Satisfying Salads and Dressings  Simple Sides and Sauces  International Cuisine-Spotlight on the [employer] Zones  Delicious Desserts  Savory Soups  [employer] - Meals in a Astronomer Appetizers and Snacks  Comforting Weekend Breakfasts  One-Pot Wonders   Fast Evening Meals  Landscape architect Your Pritikin Plate  WORKSHOPS   Healthy Mindset (Psychosocial):  Focused Goals, Sustainable Changes Clinical staff led group instruction and group discussion with PowerPoint presentation and patient guidebook. To enhance the learning environment the use of posters, models and videos may be added. Patients will be able to apply effective goal setting strategies to establish at least one personal goal, and then take consistent, meaningful action toward that goal. They will learn to identify common barriers to achieving personal goals and develop strategies to overcome them. Patients will also gain an understanding of how our mind-set can impact our ability to achieve goals and the importance of cultivating a positive and growth-oriented mind-set. The purpose of this lesson is to provide patients with a deeper understanding of how to set and achieve personal goals, as well as the tools and strategies needed to overcome common obstacles which may arise along the way.  From Head to Heart: The Power of a Healthy Outlook  Clinical staff led group instruction and group discussion with PowerPoint presentation and patient guidebook. To enhance the learning environment the use of posters, models and videos may be added. Patients will be able to recognize and  describe the impact of emotions and mood on physical health. They will discover the importance of self-care and explore self-care practices which may work for them. Patients will also learn how to utilize the 4 C's to cultivate a healthier outlook and better manage stress and challenges. The purpose of this lesson is to demonstrate to patients how a healthy outlook is an essential part of maintaining good health, especially as they continue their cardiac rehab journey.  Healthy Sleep for a Healthy Heart Clinical staff led group  instruction and group discussion with PowerPoint presentation and patient guidebook. To enhance the learning environment the use of posters, models and videos may be added. At the conclusion of this workshop, patients will be able to demonstrate knowledge of the importance of sleep to overall health, well-being, and quality of life. They will understand the symptoms of, and treatments for, common sleep disorders. Patients will also be able to identify daytime and nighttime behaviors which impact sleep, and they will be able to apply these tools to help manage sleep-related challenges. The purpose of this lesson is to provide patients with a general overview of sleep and outline the importance of quality sleep. Patients will learn about a few of the most common sleep disorders. Patients will also be introduced to the concept of "sleep hygiene," and discover ways to self-manage certain sleeping problems through simple daily behavior changes. Finally, the workshop will motivate patients by clarifying the links between quality sleep and their goals of heart-healthy living.   Recognizing and Reducing Stress Clinical staff led group instruction and group discussion with PowerPoint presentation and patient guidebook. To enhance the learning environment the use of posters, models and videos may be added. At the conclusion of this workshop, patients will be able to understand the types of stress  reactions, differentiate between acute and chronic stress, and recognize the impact that chronic stress has on their health. They will also be able to apply different coping mechanisms, such as reframing negative self-talk. Patients will have the opportunity to practice a variety of stress management techniques, such as deep abdominal breathing, progressive muscle relaxation, and/or guided imagery.  The purpose of this lesson is to educate patients on the role of stress in their lives and to provide healthy techniques for coping with it.  Learning Barriers/Preferences:  Learning Barriers/Preferences - 10/10/22 1142       Learning Barriers/Preferences   Learning Barriers Sight   wears glasses   Learning Preferences Computer/Internet;Audio;Group Instruction;Individual Instruction;Pictoral;Skilled Demonstration;Verbal Instruction;Video;Written Material             Education Topics:  Knowledge Questionnaire Score:  Knowledge Questionnaire Score - 10/10/22 1346       Knowledge Questionnaire Score   Pre Score 19/24             Core Components/Risk Factors/Patient Goals at Admission:  Personal Goals and Risk Factors at Admission - 10/10/22 1344       Core Components/Risk Factors/Patient Goals on Admission   Hypertension Yes    Intervention Provide education on lifestyle modifcations including regular physical activity/exercise, weight management, moderate sodium restriction and increased consumption of fresh fruit, vegetables, and low fat dairy, alcohol moderation, and smoking cessation.;Monitor prescription use compliance.    Expected Outcomes Short Term: Continued assessment and intervention until BP is < 140/70mm HG in hypertensive participants. < 130/64mm HG in hypertensive participants with diabetes, heart failure or chronic kidney disease.;Long Term: Maintenance of blood pressure at goal levels.    Lipids Yes    Intervention Provide education and support for participant on  nutrition & aerobic/resistive exercise along with prescribed medications to achieve LDL 70mg , HDL >40mg .    Expected Outcomes Short Term: Participant states understanding of desired cholesterol values and is compliant with medications prescribed. Participant is following exercise prescription and nutrition guidelines.;Long Term: Cholesterol controlled with medications as prescribed, with individualized exercise RX and with personalized nutrition plan. Value goals: LDL < 70mg , HDL > 40 mg.             Core  Components/Risk Factors/Patient Goals Review:   Goals and Risk Factor Review     Row Name 10/14/22 1437 10/22/22 1741 11/19/22 1541 12/18/22 0954       Core Components/Risk Factors/Patient Goals Review   Personal Goals Review Hypertension;Lipids Hypertension;Lipids Hypertension;Lipids Hypertension;Lipids    Review Normajean started intensive cardiac rehab on 10/14/22 and did well with exercise Juelle started intensive cardiac rehab on 10/14/22 and is off to a good start to exercise. Vital signs have been stable Venesa is doing well with exercise at  intensive cardiac rehab.  Vital signs have been stable Kalilah is doing well with exercise at  intensive cardiac rehab.  Vital signs have been stable. Breyanna will complete cardiac rehab on 12/20/22    Expected Outcomes Shakema will continue to participate in intensive cardiac rehab for exercise, nutrition and lifestyle modifications Aarushi will continue to participate in intensive cardiac rehab for exercise, nutrition and lifestyle modifications Archie will continue to participate in intensive cardiac rehab for exercise, nutrition and lifestyle modifications Khamiyah will continue to participate in intensive cardiac rehab for exercise, nutrition and lifestyle modifications             Core Components/Risk Factors/Patient Goals at Discharge (Final Review):   Goals and Risk Factor Review - 12/18/22 0954       Core Components/Risk Factors/Patient  Goals Review   Personal Goals Review Hypertension;Lipids    Review Ariely is doing well with exercise at  intensive cardiac rehab.  Vital signs have been stable. Chae will complete cardiac rehab on 12/20/22    Expected Outcomes Shabnam will continue to participate in intensive cardiac rehab for exercise, nutrition and lifestyle modifications             ITP Comments:  ITP Comments     Row Name 10/10/22 1138 10/14/22 1428 10/22/22 1740 11/19/22 1539 12/18/22 0952   ITP Comments Armanda Magic, MD: Medical Director.  Introduction to the [employer] / Intensive Cardiac Rehab.  Initial orientation packet reviewed with the patient. 30 day ITP Review. Fabienne started intensive cardiac rehab on  10/13/21 and did well with exercise. 30 day ITP Review. Brittanni started intensive cardiac rehab on  10/13/21 and is off to a good start to exercise 30 day ITP Review. Lakeyshia has good attendance and participation in  intensive cardiac rehab 30 day ITP Review. Armentha has good attendance and participation in  intensive cardiac rehab. Latifa will complete cardiac rehab on 12/20/22            Comments: See ITP comments.Thayer Headings RN BSN

## 2022-12-20 ENCOUNTER — Other Ambulatory Visit (HOSPITAL_COMMUNITY): Payer: Self-pay

## 2022-12-20 ENCOUNTER — Encounter (HOSPITAL_COMMUNITY)
Admission: RE | Admit: 2022-12-20 | Discharge: 2022-12-20 | Disposition: A | Discharge status: Home / Self Care | Payer: Medicaid Other | Source: Ambulatory Visit | Attending: Cardiology | Admitting: Cardiology

## 2022-12-20 DIAGNOSIS — I2129 ST elevation (STEMI) myocardial infarction involving other sites: Secondary | ICD-10-CM

## 2022-12-20 DIAGNOSIS — Z955 Presence of coronary angioplasty implant and graft: Secondary | ICD-10-CM | POA: Diagnosis not present

## 2022-12-23 ENCOUNTER — Other Ambulatory Visit (HOSPITAL_COMMUNITY): Payer: Self-pay

## 2022-12-28 ENCOUNTER — Other Ambulatory Visit (HOSPITAL_BASED_OUTPATIENT_CLINIC_OR_DEPARTMENT_OTHER): Payer: Self-pay | Admitting: Family

## 2022-12-28 DIAGNOSIS — I25118 Atherosclerotic heart disease of native coronary artery with other forms of angina pectoris: Secondary | ICD-10-CM

## 2022-12-30 NOTE — Telephone Encounter (Signed)
Rx(s) sent to pharmacy electronically.  

## 2023-01-06 ENCOUNTER — Encounter: Payer: Self-pay | Admitting: *Deleted

## 2023-01-06 DIAGNOSIS — Z006 Encounter for examination for normal comparison and control in clinical research program: Secondary | ICD-10-CM

## 2023-01-14 ENCOUNTER — Other Ambulatory Visit (HOSPITAL_BASED_OUTPATIENT_CLINIC_OR_DEPARTMENT_OTHER): Payer: Self-pay | Admitting: Family

## 2023-01-14 DIAGNOSIS — I25118 Atherosclerotic heart disease of native coronary artery with other forms of angina pectoris: Secondary | ICD-10-CM

## 2023-01-14 DIAGNOSIS — I10 Essential (primary) hypertension: Secondary | ICD-10-CM

## 2023-01-14 NOTE — Telephone Encounter (Signed)
Rx request sent to pharmacy.  

## 2023-01-15 ENCOUNTER — Other Ambulatory Visit: Payer: Self-pay

## 2023-01-15 ENCOUNTER — Ambulatory Visit (HOSPITAL_BASED_OUTPATIENT_CLINIC_OR_DEPARTMENT_OTHER): Payer: Medicaid Other | Admitting: Cardiology

## 2023-01-15 ENCOUNTER — Encounter (HOSPITAL_BASED_OUTPATIENT_CLINIC_OR_DEPARTMENT_OTHER): Payer: Self-pay | Admitting: Cardiology

## 2023-01-15 ENCOUNTER — Other Ambulatory Visit (HOSPITAL_COMMUNITY): Payer: Self-pay

## 2023-01-15 VITALS — BP 119/67 | HR 62 | Ht 66.75 in | Wt 144.0 lb

## 2023-01-15 DIAGNOSIS — I10 Essential (primary) hypertension: Secondary | ICD-10-CM | POA: Diagnosis not present

## 2023-01-15 DIAGNOSIS — E785 Hyperlipidemia, unspecified: Secondary | ICD-10-CM | POA: Diagnosis not present

## 2023-01-15 DIAGNOSIS — I25118 Atherosclerotic heart disease of native coronary artery with other forms of angina pectoris: Secondary | ICD-10-CM | POA: Diagnosis not present

## 2023-01-15 DIAGNOSIS — I252 Old myocardial infarction: Secondary | ICD-10-CM | POA: Diagnosis not present

## 2023-01-15 NOTE — Progress Notes (Signed)
Cardiology Office Note:  .    Date:  01/15/2023  ID:  Marisa Fuller, DOB 1961/03/22, MRN [id number] PCP: Philomena Doheny, MD  [facility] Providers Cardiologist:  Jodelle Red, MD     History of Present Illness: Marisa Fuller    Marisa Fuller is a 62 y.o. female with a hx of palpitations and chest discomfort who is seen for follow up. I initially met her 08/31/19 as a new consult at the request of Marisa Mocha, MD for the evaluation and management of palpitations and chest discomfort.   At her visit 09/2019, she still had fatigue and felt intermittent pounding palpitations. Chest heaviness had resolved. Reviewed monitor results with her: Monitor showed one episode of SVT, only 12 beats long. The 25 patient triggered events were all sinus. She stopped the lisinopril as she did not feel well on it. Felt better off of the medication. Home blood pressures were well controlled. We reviewed her lipid results: Cholesterol very elevated, in line with HeFH. We discussed this and how it is managed. She did not start statin; she has instead been focused on diet changes. Has cut out all added sugar. Started Atkins diet program. She preferred to discuss possible PCSK9i with pharmacists. Her niece had been diagnosed with Lynch syndrome.   Admitted 07/18/22 for chest pain associated with shortness of breath, nausea, diaphoresis. Initial EKG was concerning for subtle ST elevations in leads III, aVF, V4 through 5. She underwent LHC which showed thrombotic subtotal occlusion of the mid LCx treated successfully with DES to OM1 branch. Recommendations for DAPT with aspirin and Brilinta x 1 year. She was started on Lipitor 80 mg once daily. Metoprolol was switched to tartrate 25 mg twice daily, losartan 50 mg once daily, Brilinta 90 mg twice daily, aspirin 81 mg once daily.  She was also referred to the lipid clinic for LDL of 278.  She met criteria for the Evolve-MI trial and started on Repatha. She followed  up with Gillian Shields, NP 07/26/2022. Blood pressures were well controlled.  Today, she states that things are going uphill. She has been feeling like her energy has been gradually improving.   At times, she has discomfort/pain localized to her face that comes and goes. This is often associated with overexertion.  She is still having some episodes of fluttering palpitations every once in a while.  Also confirms easy bleeding on Brilinta.  Regarding her diet, she has been doing well with avoiding salt and sugar.  She denies any chest pain, shortness of breath, peripheral edema, lightheadedness, headaches, syncope, orthopnea, or PND.  ROS:  Please see the history of present illness. ROS otherwise negative except as noted.  (+) Intermittent facial pain (+) Occasional fluttering palpitations (+) Easy bleeding  Studies Reviewed: .         Echocardiogram  07/19/2022:  1. Sub optimal images due to breast implants.   2. Mildly abnormal strain with some apical sparing . Left ventricular  ejection fraction, by estimation, is 60 to 65%. The left ventricle has  normal function. The left ventricle has no regional wall motion  abnormalities. There is mild left ventricular  hypertrophy. Left ventricular diastolic parameters were normal. The  average left ventricular global longitudinal strain is -13.7 %. The global  longitudinal strain is abnormal.   3. Right ventricular systolic function is normal. The right ventricular  size is normal.   4. The mitral valve is abnormal. Trivial mitral valve regurgitation. No  evidence of mitral stenosis.  5. The aortic valve is tricuspid. Aortic valve regurgitation is not  visualized. No aortic stenosis is present.   6. The inferior vena cava is normal in size with greater than 50%  respiratory variability, suggesting right atrial pressure of 3 mmHg.   Left Heart Cath  07/18/2022:   Prox RCA lesion is 30% stenosed.   Mid RCA lesion is 60% stenosed.   Mid  RCA to Dist RCA lesion is 40% stenosed.   1st Mrg lesion is 99% stenosed.   Prox LAD lesion is 20% stenosed.   A drug-eluting stent was successfully placed using a STENT ONYX FRONTIER [id number].   Post intervention, there is a 0% residual stenosis.   There is mild left ventricular systolic dysfunction.   The left ventricular ejection fraction is 35-45% by visual estimate.   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 Diffuse mild disease in the proximal and distal vessel.  Anteroapical hypokinesis of LV, LVEF around 40%.    Recommendations: Will admit to the ICU. Continue Aggrastat drip for 2 hours. Continue DAPT with ASA and Brilinta for one year. Will start high intensity statin and low dose beta blocker. Echo tomorrow. I think medical management of the moderate stenosis in the mid RCA is appropriate.   Physical Exam:    VS:  BP 119/67   Pulse 62   Ht 5' 6.75" (1.695 m)   Wt 144 lb (65.3 kg)   BMI 22.72 kg/m    Wt Readings from Last 3 Encounters:  01/15/23 144 lb (65.3 kg)  12/16/22 152 lb 5.4 oz (69.1 kg)  10/10/22 149 lb 7.6 oz (67.8 kg)    GEN: Well nourished, well developed in no acute distress HEENT: Normal, moist mucous membranes NECK: No JVD CARDIAC: regular rhythm, normal S1 and S2, no rubs or gallops. No murmur. VASCULAR: Radial and DP pulses 2+ bilaterally. No carotid bruits RESPIRATORY:  Clear to auscultation without rales, wheezing or rhonchi  ABDOMEN: Soft, non-tender, non-distended MUSCULOSKELETAL:  Ambulates independently SKIN: Warm and dry, no edema NEUROLOGIC:  Alert and oriented x 3. No focal neuro deficits noted. PSYCHIATRIC:  Normal affect   ASSESSMENT AND PLAN: .    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 Most recent lipids with Tcho 85, LDL 16, HDL 53, TG 77   Cardiac risk counseling and prevention recommendations: -recommend heart healthy/Mediterranean diet, with whole grains, fruits, vegetable, fish, lean meats, nuts, and olive oil. Limit salt. -recommend moderate walking, 3-5 times/week for 30-50 minutes each session. Aim for at least 150 minutes.week. Goal should be pace of 3 miles/hours, or walking 1.5 miles in 30 minutes -recommend avoidance of tobacco products. Avoid excess alcohol.  Dispo: Follow-up in 6 months, or sooner as needed.  I,Mathew Stumpf,acting as a Neurosurgeon for [employer], MD.,have documented all relevant documentation on the behalf of Jodelle Red, MD,as directed by  Jodelle Red, MD while in the presence of Jodelle Red, MD.  I, Jodelle Red, MD, have reviewed all documentation for this visit. The documentation on  03/05/23 for the exam, diagnosis, procedures, and orders are all accurate and complete.   Signed, Jodelle Red, MD

## 2023-01-15 NOTE — Patient Instructions (Signed)

## 2023-01-21 ENCOUNTER — Telehealth (HOSPITAL_BASED_OUTPATIENT_CLINIC_OR_DEPARTMENT_OTHER): Payer: Self-pay

## 2023-01-21 MED ORDER — TICAGRELOR 90 MG PO TABS: 90.0000 mg | ORAL_TABLET | Freq: Two times a day (BID) | ORAL | 3 refills | Status: DC

## 2023-01-21 NOTE — Telephone Encounter (Signed)
Received fax from [facility], new rx needed for refills. Printed and faxed.

## 2023-01-21 NOTE — Addendum Note (Signed)
Addended by: Marlene Lard on: 01/21/2023 09:43 AM   Modules accepted: Orders

## 2023-03-08 ENCOUNTER — Encounter (HOSPITAL_COMMUNITY): Payer: Self-pay

## 2023-03-10 NOTE — Research (Signed)
Follow-Up Visit Completed*   []  Not Necessary, No Potential Adverse Events Or Medication Issues Reported On Completed Subject Questionnaire   [x]  Yes, Contact With Subject/Alternate Contact Completed   []  Yes, No Contact With Subject/Alternate Contact Completed, But Electronic Health Record Was Reviewed   []  No, Unable To Contact Subject/Alternate Contact   Have you reviewed Ongoing medications on the Targeted Concomitant Medication form and updated the form as needed?   [x]  Yes   []  No   Subject Status*   [x]  Continuing In Follow-up   []  At Risk For Lost To Follow-up   []  Withdrawal From All Future Study Activities Including Passive Follow-up By Electronic Health Record Review Or Contact With Healthcare Provider Or Family Member/Friend   []  Death   Vital Status*   [x]  Alive   []  Deceased   []  Unknown   Last Known To Be Alive Source*   [x]  Subject Completed Follow-up Questionnaire/Seen In Person/Via Telephone Contact   []  Family Member or Caretaker   []  Primary Physician Or Medical Records   []  Publicly Available Source   []  Other  Date of last dose taken   11/Jul/2024  Over the last 12 weeks did the subject miss any doses? N/a  Over the last 12 weeks did the subject restart Evolocumab after an interruption?n/a

## 2023-03-13 ENCOUNTER — Other Ambulatory Visit (HOSPITAL_BASED_OUTPATIENT_CLINIC_OR_DEPARTMENT_OTHER): Payer: Self-pay | Admitting: Family

## 2023-03-13 DIAGNOSIS — J301 Allergic rhinitis due to pollen: Secondary | ICD-10-CM

## 2023-03-31 ENCOUNTER — Encounter: Payer: Self-pay | Admitting: *Deleted

## 2023-03-31 DIAGNOSIS — Z006 Encounter for examination for normal comparison and control in clinical research program: Secondary | ICD-10-CM

## 2023-03-31 NOTE — Research (Signed)
36 Week Follow-Up Visit Completed*   []  Not Necessary, No Potential Adverse Events Or Medication Issues Reported On Completed Subject Questionnaire   [x]  Yes, Contact With Subject/Alternate Contact Completed   []  Yes, No Contact With Subject/Alternate Contact Completed, But Electronic Health Record Was Reviewed   []  No, Unable To Contact Subject/Alternate Contact   Have you reviewed Ongoing medications on the Targeted Concomitant Medication form and updated the form as needed?   [x]  Yes   []  No   Subject Status*   [x]  Continuing In Follow-up   []  At Risk For Lost To Follow-up   []  Withdrawal From All Future Study Activities Including Passive Follow-up By Electronic Health Record Review Or Contact With Healthcare Provider Or Family Member/Friend   []  Death   Vital Status*   [x]  Alive   []  Deceased   []  Unknown   Last Known To Be Alive Source*   [x]  Subject Completed Follow-up Questionnaire/Seen In Person/Via Telephone Contact   []  Family Member or Caretaker   []  Primary Physician Or Medical Records   []  Publicly Available Source   []  Other  Date of last dose taken   28-Mar-2023  Over the last 12 weeks did the subject miss any doses?no   Over the last 12 weeks did the subject restart Evolocumab after an interruption?no

## 2023-04-29 ENCOUNTER — Other Ambulatory Visit (HOSPITAL_COMMUNITY): Payer: Self-pay

## 2023-05-06 ENCOUNTER — Other Ambulatory Visit: Payer: Self-pay

## 2023-05-21 ENCOUNTER — Other Ambulatory Visit: Payer: Self-pay

## 2023-05-23 ENCOUNTER — Other Ambulatory Visit: Payer: Self-pay

## 2023-05-26 ENCOUNTER — Other Ambulatory Visit: Payer: Self-pay

## 2023-05-26 NOTE — Progress Notes (Signed)
Specialty Pharmacy Refill Coordination Note  Marisa Fuller is a 62 y.o. female contacted today regarding refills of specialty medication(s) Evolocumab   Patient requested Delivery   Delivery date: 06/04/23   Verified address: [address]  [place] [id number]   Medication will be filled on 06/03/23.

## 2023-06-03 ENCOUNTER — Other Ambulatory Visit: Payer: Self-pay

## 2023-06-25 ENCOUNTER — Other Ambulatory Visit (HOSPITAL_BASED_OUTPATIENT_CLINIC_OR_DEPARTMENT_OTHER): Payer: Self-pay | Admitting: Family

## 2023-06-25 DIAGNOSIS — I25118 Atherosclerotic heart disease of native coronary artery with other forms of angina pectoris: Secondary | ICD-10-CM

## 2023-06-27 ENCOUNTER — Encounter: Payer: Self-pay | Admitting: *Deleted

## 2023-06-27 DIAGNOSIS — Z006 Encounter for examination for normal comparison and control in clinical research program: Secondary | ICD-10-CM

## 2023-06-27 NOTE — Research (Signed)
 48-WEEK Follow-Up Visit Completed*   []  Not Necessary, No Potential Adverse Events Or Medication Issues Reported On Completed Subject Questionnaire   [x]  Yes, Contact With Subject/Alternate Contact Completed   []  Yes, No Contact With Subject/Alternate Contact Completed, But Electronic Health Record Was Reviewed   []  No, Unable To Contact Subject/Alternate Contact   Have you reviewed Ongoing medications on the Targeted Concomitant Medication form and updated the form as needed?   [x]  Yes   []  No   Subject Status*   [x]  Continuing In Follow-up   []  At Risk For Lost To Follow-up   []  Withdrawal From All Future Study Activities Including Passive Follow-up By Electronic Health Record Review Or Contact With Healthcare Provider Or Family Member/Friend   []  Death   Vital Status*   [x]  Alive   []  Deceased   []  Unknown   Last Known To Be Alive Source*   [x]  Subject Completed Follow-up Questionnaire/Seen In Person/Via Telephone Contact   []  Family Member or Caretaker   []  Primary Physician Or Medical Records   []  Publicly Available Source   []  Other  Date of last dose taken   25-Jun-2023  Over the last 12 weeks did the subject miss any doses?NO   Over the last 12 weeks did the subject restart Evolocumab  after an interruption? NO

## 2023-07-23 ENCOUNTER — Telehealth (HOSPITAL_BASED_OUTPATIENT_CLINIC_OR_DEPARTMENT_OTHER): Payer: Self-pay | Admitting: *Deleted

## 2023-07-23 NOTE — Telephone Encounter (Signed)
 Received notification that patient needs to re-enroll in patient assistance for Brlinta She also need follow up  Forms have been printed if would like to come by here and fill out. Needs to bring and current insurance card so copy can be sent with application  Left message to call back

## 2023-07-26 ENCOUNTER — Other Ambulatory Visit (HOSPITAL_BASED_OUTPATIENT_CLINIC_OR_DEPARTMENT_OTHER): Payer: Self-pay | Admitting: Cardiology

## 2023-07-26 DIAGNOSIS — I25118 Atherosclerotic heart disease of native coronary artery with other forms of angina pectoris: Secondary | ICD-10-CM

## 2023-07-26 DIAGNOSIS — I10 Essential (primary) hypertension: Secondary | ICD-10-CM

## 2023-08-07 NOTE — Telephone Encounter (Signed)
Returned call to patient, patient states she thought she was supposed to come off some of these medications after a year. She would like to wait to fill out patient assistance until she talks to her. Scheduled patient for follow up tomorrow 2/21 at 3:20pm with Dr. Cristal Deer. She also states she believes she is having issues with her repatha and would like to discuss more at her appointment.

## 2023-08-07 NOTE — Telephone Encounter (Signed)
 Patient returned RN's call.

## 2023-08-08 ENCOUNTER — Ambulatory Visit (HOSPITAL_BASED_OUTPATIENT_CLINIC_OR_DEPARTMENT_OTHER): Payer: Medicaid Other | Admitting: Cardiology

## 2023-08-08 ENCOUNTER — Encounter (HOSPITAL_BASED_OUTPATIENT_CLINIC_OR_DEPARTMENT_OTHER): Payer: Self-pay | Admitting: Cardiology

## 2023-08-08 VITALS — BP 142/62 | HR 75 | Ht 66.75 in | Wt 158.1 lb

## 2023-08-08 DIAGNOSIS — E782 Mixed hyperlipidemia: Secondary | ICD-10-CM

## 2023-08-08 DIAGNOSIS — I252 Old myocardial infarction: Secondary | ICD-10-CM

## 2023-08-08 DIAGNOSIS — I2510 Atherosclerotic heart disease of native coronary artery without angina pectoris: Secondary | ICD-10-CM

## 2023-08-08 DIAGNOSIS — Z7189 Other specified counseling: Secondary | ICD-10-CM

## 2023-08-08 DIAGNOSIS — E7801 Familial hypercholesterolemia: Secondary | ICD-10-CM | POA: Diagnosis not present

## 2023-08-08 NOTE — Progress Notes (Signed)
 Cardiology Office Note:  .   Date:  08/08/2023  ID:  Marisa Fuller, DOB 10-18-1960, MRN [id number] PCP: Philomena Doheny, MD  [facility] Providers Cardiologist:  Jodelle Red, MD {  History of Present Illness: Marisa Fuller   Marisa Fuller is a 63 y.o. female with a hx of palpitations, CAD with STEMI 07/2022 s/p DES to OM1, mixed hyperlipidemia who is seen for follow up. I initially met her 08/31/19 as a new consult at the request of Sherren Mocha, MD for the evaluation and management of palpitations and chest discomfort.   Pertinent CV history: Echo 07/2022 EF 60-65%. Cath 07/2022 acute inferolateral STEMI, received DES to OM1, otherwise nonobstructive disease.  Today: Has questions regarding medications, reviewed extensively today. She is now 1 year out from her STEMI.  Has been having some side effects she relates to repatha. She notes that she has URI symptoms within a day of taking her shot. She feels hoarse, has flulike symptoms. Last dose 2 days ago.  She is 2 mos out from her STEMI. Discussed stopping brilinta today, she is amenable.  We also discussed stopping metoprolol. Her EF is normal. Has not had any angina or palpitations. Given the history of palpitations, we will continue metoprolol.  Had stressful family phone call just prior to visit, hence elevated BP  ROS: Denies chest pain, shortness of breath at rest or with normal exertion. No PND, orthopnea, LE edema or unexpected weight gain. No syncope or palpitations. ROS otherwise negative except as noted.   Studies Reviewed: Marisa Fuller    EKG:       Physical Exam:   VS:  BP (!) 142/62   Pulse 75   Ht 5' 6.75" (1.695 m)   Wt 158 lb 1.6 oz (71.7 kg)   SpO2 98%   BMI 24.95 kg/m    Wt Readings from Last 3 Encounters:  08/08/23 158 lb 1.6 oz (71.7 kg)  01/15/23 144 lb (65.3 kg)  12/16/22 152 lb 5.4 oz (69.1 kg)    GEN: Well nourished, well developed in no acute distress HEENT: Normal, moist mucous membranes NECK:  No JVD CARDIAC: regular rhythm, normal S1 and S2, no rubs or gallops. No murmur. VASCULAR: Radial and DP pulses 2+ bilaterally. No carotid bruits RESPIRATORY:  Clear to auscultation without rales, wheezing or rhonchi  ABDOMEN: Soft, non-tender, non-distended MUSCULOSKELETAL:  Ambulates independently SKIN: Warm and dry, no edema NEUROLOGIC:  Alert and oriented x 3. No focal neuro deficits noted. PSYCHIATRIC:  Normal affect    ASSESSMENT AND PLAN: .    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 Most recent lipids with Tcho 85, LDL 16, HDL 53, TG 77 -some concern for side effects with repatha, discussed repatha alternative, will recheck lipids once options/timeline for changes are known -I reached out to the research team, ok to change repatha if needed, will just need to alert them -I reached out to the pharmacy team, might be covered for leqvio, though may still have immune side effects (though these would be less frequent with leqvio administration than repatha)  CV risk counseling and prevention -recommend  heart healthy/Mediterranean diet, with whole grains, fruits, vegetable, fish, lean meats, nuts, and olive oil. Limit salt. -recommend moderate walking, 3-5 times/week for 30-50 minutes each session. Aim for at least 150 minutes.week. Goal should be pace of 3 miles/hours, or walking 1.5 miles in 30 minutes -recommend avoidance of tobacco products. Avoid excess alcohol.  Dispo: 6 mos or sooner as needed  Signed, Jodelle Red, MD   Jodelle Red,  MD, PhD, [facility]  [facility]  [place]  [facility] at [facility] at [facility] [address] [place], [place] [address] [phone]

## 2023-08-08 NOTE — Patient Instructions (Addendum)
Medication Instructions:  Your physician has recommended you make the following change in your medication:   STOP brilinta, continue aspirin I've reached out to the pharmacy team about a repatha alternative, will contact you when I hear something.  Follow-Up: At [facility], you and your health needs are our priority.  As part of our continuing mission to provide you with exceptional heart care, we have created designated Provider Care Teams.  These Care Teams include your primary Cardiologist (physician) and Advanced Practice Providers (APPs -  Physician Assistants and Nurse Practitioners) who all work together to provide you with the care you need, when you need it.  We recommend signing up for the patient portal called "MyChart".  Sign up information is provided on this After Visit Summary.  MyChart is used to connect with patients for Virtual Visits (Telemedicine).  Patients are able to view lab/test results, encounter notes, upcoming appointments, etc.  Non-urgent messages can be sent to your provider as well.   To learn more about what you can do with MyChart, go to [email or web address].    Your next appointment:   6 month(s)  Provider:   Jodelle Red, MD

## 2023-08-21 ENCOUNTER — Telehealth (HOSPITAL_BASED_OUTPATIENT_CLINIC_OR_DEPARTMENT_OTHER): Payer: Self-pay | Admitting: Cardiology

## 2023-08-21 ENCOUNTER — Encounter: Payer: Self-pay | Admitting: Pharmacist Clinician (PhC)/ Clinical Pharmacy Specialist

## 2023-08-21 NOTE — Telephone Encounter (Signed)
 Hi team, I see in Dr. Di Kindle last note she mentions reaching out to you guys about leqvio. Did anything get started for this patient?

## 2023-08-21 NOTE — Telephone Encounter (Signed)
 New Message:      Patient said on her last office visit(08-08-23) with Dr Cristal Deer they had discussed her being on another medication. She said she thought Dr Cristal Deer said something about an infusion. Please

## 2023-08-21 NOTE — Telephone Encounter (Signed)
 I didn't see any messages.  I can reach out to patient and see if she can sign the benefits form electronically

## 2023-08-27 ENCOUNTER — Telehealth: Payer: Self-pay | Admitting: Pharmacist Clinician (PhC)/ Clinical Pharmacy Specialist

## 2023-08-27 DIAGNOSIS — E782 Mixed hyperlipidemia: Secondary | ICD-10-CM

## 2023-08-27 NOTE — Telephone Encounter (Signed)
 Patient has been in [place], but not tolerating Repatha.  Will need updated lipid labs in order to apply for Leqvio.  She does have familial hyperlipidemia (LDL > 200 in the past)

## 2023-08-28 LAB — LIPID PANEL
Chol/HDL Ratio: 2.4 ratio (ref 0.0–4.4)
Cholesterol, Total: 120 mg/dL (ref 100–199)
HDL: 50 mg/dL (ref >39)
LDL Chol Calc (NIH): 52 mg/dL (ref 0–99)
Triglycerides: 93 mg/dL (ref 0–149)
VLDL Cholesterol Cal: 18 mg/dL (ref 5–40)

## 2023-08-28 LAB — HEPATIC FUNCTION PANEL
ALT: 56 IU/L — ABNORMAL HIGH (ref 0–32)
AST: 35 IU/L (ref 0–40)
Albumin: 4.2 g/dL (ref 3.9–4.9)
Alkaline Phosphatase: 115 IU/L (ref 44–121)
Bilirubin Total: 0.6 mg/dL (ref 0.0–1.2)
Bilirubin, Direct: 0.25 mg/dL (ref 0.00–0.40)
Total Protein: 7 g/dL (ref 6.0–8.5)

## 2023-08-29 ENCOUNTER — Encounter (HOSPITAL_BASED_OUTPATIENT_CLINIC_OR_DEPARTMENT_OTHER): Payer: Self-pay

## 2023-08-29 DIAGNOSIS — E782 Mixed hyperlipidemia: Secondary | ICD-10-CM

## 2023-09-23 ENCOUNTER — Other Ambulatory Visit (HOSPITAL_BASED_OUTPATIENT_CLINIC_OR_DEPARTMENT_OTHER): Payer: Self-pay | Admitting: Family

## 2023-09-23 DIAGNOSIS — J301 Allergic rhinitis due to pollen: Secondary | ICD-10-CM

## 2023-10-17 ENCOUNTER — Encounter: Payer: Self-pay | Admitting: *Deleted

## 2023-10-17 DIAGNOSIS — Z006 Encounter for examination for normal comparison and control in clinical research program: Secondary | ICD-10-CM

## 2023-10-17 NOTE — Research (Signed)
 60 WEEK Follow-Up Visit Completed*   []  Not Necessary, No Potential Adverse Events Or Medication Issues Reported On Completed Subject Questionnaire   [x]  Yes, Contact With Subject/Alternate Contact Completed   []  Yes, No Contact With Subject/Alternate Contact Completed, But Electronic Health Record Was Reviewed   []  No, Unable To Contact Subject/Alternate Contact   Have you reviewed Ongoing medications on the Targeted Concomitant Medication form and updated the form as needed?   [x]  Yes   []  No   Subject Status*   [x]  Continuing In Follow-up   []  At Risk For Lost To Follow-up   []  Withdrawal From All Future Study Activities Including Passive Follow-up By Electronic Health Record Review Or Contact With Healthcare Provider Or Family Member/Friend   []  Death   Vital Status*   [x]  Alive   []  Deceased   []  Unknown   Last Known To Be Alive Source*   [x]  Subject Completed Follow-up Questionnaire/Seen In Person/Via Telephone Contact   []  Family Member or Caretaker   []  Primary Physician Or Medical Records   []  Publicly Available Source   []  Other  Date of last dose taken  21-Aug-2023    Over the last 12 weeks did the subject miss any doses?YES 3   Over the last 12 weeks did the subject restart Evolocumab  after an interruption?NO

## 2023-10-31 ENCOUNTER — Other Ambulatory Visit: Payer: Self-pay

## 2023-10-31 NOTE — Progress Notes (Signed)
 Specialty Pharmacy Refill Coordination Note  Marisa Fuller is a 63 y.o. female contacted today regarding refills of specialty medication(s) Evolocumab  (Repatha  [employer])   Patient requested Delivery   Delivery date: 11/19/23   Verified address: [address]  [place] [phone]   Medication will be filled on 06.03.25.

## 2023-12-29 ENCOUNTER — Encounter: Payer: Self-pay | Admitting: *Deleted

## 2023-12-29 ENCOUNTER — Other Ambulatory Visit (HOSPITAL_BASED_OUTPATIENT_CLINIC_OR_DEPARTMENT_OTHER): Payer: Self-pay | Admitting: Family

## 2023-12-29 DIAGNOSIS — Z006 Encounter for examination for normal comparison and control in clinical research program: Secondary | ICD-10-CM

## 2023-12-29 DIAGNOSIS — I25118 Atherosclerotic heart disease of native coronary artery with other forms of angina pectoris: Secondary | ICD-10-CM

## 2024-01-30 ENCOUNTER — Other Ambulatory Visit (HOSPITAL_BASED_OUTPATIENT_CLINIC_OR_DEPARTMENT_OTHER): Payer: Self-pay | Admitting: Cardiology

## 2024-01-30 DIAGNOSIS — I10 Essential (primary) hypertension: Secondary | ICD-10-CM

## 2024-02-23 ENCOUNTER — Encounter: Payer: Self-pay | Admitting: *Deleted

## 2024-02-23 DIAGNOSIS — Z006 Encounter for examination for normal comparison and control in clinical research program: Secondary | ICD-10-CM

## 2024-02-23 NOTE — Research (Signed)
Week 72Follow-Up Visit Completed*   []  Not Necessary, No Potential Adverse Events Or Medication Issues Reported On Completed Subject Questionnaire   []  Yes, Contact With Subject/Alternate Contact Completed   [x]  Yes, No Contact With Subject/Alternate Contact Completed, But Electronic Health Record Was Reviewed   []  No, Unable To Contact Subject/Alternate Contact   Have you reviewed Ongoing medications on the Targeted Concomitant Medication form and updated the form as needed?   [x]  Yes   []  No   Subject Status*   [x]  Continuing In Follow-up   []  At Risk For Lost To Follow-up   []  Withdrawal From All Future Study Activities Including Passive Follow-up By Electronic Health Record Review Or Contact With Healthcare Provider Or Family Member/Friend   []  Death   Vital Status*   [x]  Alive   []  Deceased   []  Unknown   Last Known To Be Alive Source*   []  Subject Completed Follow-up Questionnaire/Seen In Person/Via Telephone Contact   []  Family Member or Caretaker   [x]  Primary Physician Or Medical Records   []  Publicly Available Source   []  Other

## 2024-02-23 NOTE — Research (Signed)
 Follow-Up Visit Completed*   []  Not Necessary, No Potential Adverse Events Or Medication Issues Reported On Completed Subject Questionnaire   [x]  Yes, Contact With Subject/Alternate Contact Completed   []  Yes, No Contact With Subject/Alternate Contact Completed, But Electronic Health Record Was Reviewed   []  No, Unable To Contact Subject/Alternate Contact   Have you reviewed Ongoing medications on the Targeted Concomitant Medication form and updated the form as needed?   [x]  Yes   []  No   Subject Status*   [x]  Continuing In Follow-up   []  At Risk For Lost To Follow-up   []  Withdrawal From All Future Study Activities Including Passive Follow-up By Electronic Health Record Review Or Contact With Healthcare Provider Or Family Member/Friend   []  Death   Vital Status*   [x]  Alive   []  Deceased   []  Unknown   Last Known To Be Alive Source*   [x]  Subject Completed Follow-up Questionnaire/Seen In Person/Via Telephone Contact   []  Family Member or Caretaker   []  Primary Physician Or Medical Records   []  Publicly Available Source   []  Other  Patient is still not on drug wants inclisiran but last ldl was too high I reached out to office to get this set up.

## 2024-02-24 ENCOUNTER — Telehealth: Payer: Self-pay | Admitting: Pharmacist Clinician (PhC)/ Clinical Pharmacy Specialist

## 2024-02-24 NOTE — Telephone Encounter (Signed)
-----   Message from Nurse Tech [place] C sent at 02/23/2024  3:38 PM EDT ----- Regarding: EVOLVE Study patient REPATHA  medication Hi Irma Roulhac:  I called Mrs.Gadbois for her Week 84 visit for the EVOLVE study that she  is enrolled in and the sponsor was asking me if this patient was continuing on the drug since she let our pharmacy send her more of the medication at her Week 72 visit which was around May. She was taking what we call a drug holiday to see if her side effects would go away. Your patient message back on 09-22-2023 stated she needed labs draw again in 6 weeks since her insurance would not pay for the  Inclisiran  since her LDL was lower than 70. She said someone was supposed to call her back and set up a lab appointment. I told her I would reach out to see if someone could set this up. She will be in the study even if she is not on drug. For now I will tell the sponsor she is waiting to see if she will get approved for the Inclisiran.   Thanks  [place]

## 2024-02-24 NOTE — Telephone Encounter (Signed)
 Message sent to patient via MyChart.

## 2024-03-06 LAB — HEPATIC FUNCTION PANEL
ALT: 36 IU/L — ABNORMAL HIGH (ref 0–32)
AST: 31 IU/L (ref 0–40)
Albumin: 4.5 g/dL (ref 3.9–4.9)
Alkaline Phosphatase: 119 IU/L (ref 49–135)
Bilirubin Total: 0.6 mg/dL (ref 0.0–1.2)
Bilirubin, Direct: 0.19 mg/dL (ref 0.00–0.40)
Total Protein: 7.5 g/dL (ref 6.0–8.5)

## 2024-03-06 LAB — LIPID PANEL
Chol/HDL Ratio: 2.9 ratio (ref 0.0–4.4)
Cholesterol, Total: 158 mg/dL (ref 100–199)
HDL: 54 mg/dL (ref >39)
LDL Chol Calc (NIH): 85 mg/dL (ref 0–99)
Triglycerides: 103 mg/dL (ref 0–149)
VLDL Cholesterol Cal: 19 mg/dL (ref 5–40)

## 2024-04-13 ENCOUNTER — Ambulatory Visit (HOSPITAL_BASED_OUTPATIENT_CLINIC_OR_DEPARTMENT_OTHER): Payer: Self-pay | Admitting: Cardiology

## 2024-05-06 ENCOUNTER — Other Ambulatory Visit (HOSPITAL_COMMUNITY): Payer: Self-pay

## 2024-05-06 ENCOUNTER — Other Ambulatory Visit (HOSPITAL_COMMUNITY): Payer: Self-pay | Admitting: Cardiology

## 2024-05-06 DIAGNOSIS — I2510 Atherosclerotic heart disease of native coronary artery without angina pectoris: Secondary | ICD-10-CM

## 2024-05-06 DIAGNOSIS — E7800 Pure hypercholesterolemia, unspecified: Secondary | ICD-10-CM

## 2024-05-07 ENCOUNTER — Other Ambulatory Visit: Payer: Self-pay

## 2024-05-07 MED ORDER — REPATHA SURECLICK 140 MG/ML ~~LOC~~ SOAJ
140.0000 mg | SUBCUTANEOUS | 3 refills | Status: AC
  Filled 2024-05-07 – 2024-05-17 (×3): qty 6, 84d supply, fill #0

## 2024-05-10 ENCOUNTER — Other Ambulatory Visit: Payer: Self-pay

## 2024-05-12 ENCOUNTER — Other Ambulatory Visit (HOSPITAL_COMMUNITY): Payer: Self-pay

## 2024-05-17 ENCOUNTER — Other Ambulatory Visit: Payer: Self-pay

## 2024-05-17 NOTE — Progress Notes (Signed)
 Specialty Pharmacy Refill Coordination Note  Marisa Fuller is a 63 y.o. female contacted today regarding refills of specialty medication(s) Evolocumab  (Repatha  SureClick)   Patient requested Delivery   Delivery date: 05/19/24   Verified address: [address]  [place] [address]   Medication will be filled on: 05/18/24

## 2024-06-23 ENCOUNTER — Encounter: Payer: Self-pay | Admitting: *Deleted

## 2024-06-23 DIAGNOSIS — Z006 Encounter for examination for normal comparison and control in clinical research program: Secondary | ICD-10-CM

## 2024-06-23 NOTE — Research (Addendum)
 Week 96 Follow-Up Visit Completed*   []  Not Necessary, No Potential Adverse Events Or Medication Issues Reported On Completed Subject Questionnaire   [x]  Yes, Contact With Subject/Alternate Contact Completed   []  Yes, No Contact With Subject/Alternate Contact Completed, But Electronic Health Record Was Reviewed   []  No, Unable To Contact Subject/Alternate Contact   Have you reviewed Ongoing medications on the Targeted Concomitant Medication form and updated the form as needed?   [x]  Yes   []  No   Subject Status*   [x]  Continuing In Follow-up   []  At Risk For Lost To Follow-up   []  Withdrawal From All Future Study Activities Including Passive Follow-up By Electronic Health Record Review Or Contact With Healthcare Provider Or Family Member/Friend   []  Death   Vital Status*   [x]  Alive   []  Deceased   []  Unknown   Last Known To Be Alive Source*   [x]  Subject Completed Follow-up Questionnaire/Seen In Person/Via Telephone Contact   []  Family Member or Caretaker   []  Primary Physician Or Medical Records   []  Publicly Available Source   []  Other  Date of last dose taken   17-Jun-2024  Over the last 12 weeks did the subject miss any doses? Yes 6. Patient wanted to start leqvio but did not hear back from Cardiologist so started repatha  again   Over the last 12 weeks did the subject restart Evolocumab  after an interruption? Yes

## 2024-07-01 ENCOUNTER — Other Ambulatory Visit (HOSPITAL_BASED_OUTPATIENT_CLINIC_OR_DEPARTMENT_OTHER): Payer: Self-pay | Admitting: Cardiology

## 2024-07-01 DIAGNOSIS — I25118 Atherosclerotic heart disease of native coronary artery with other forms of angina pectoris: Secondary | ICD-10-CM

## 2024-07-02 NOTE — Telephone Encounter (Signed)
 Lipid Panel Completed on 03/05/24
